# Patient Record
Sex: Female | Born: 1935 | Race: White | Hispanic: No | Marital: Single | Smoking: Current every day smoker
Health system: Southern US, Community
[De-identification: ages and names within clinical notes are randomized; demographics above are authoritative.]

## PROBLEM LIST (undated history)

## (undated) HISTORY — PX: ABDOMINAL HYSTERECTOMY: SHX81

## (undated) HISTORY — PX: APPENDECTOMY: SHX54

## (undated) HISTORY — PX: KNEE SURGERY: SHX244

---

## 2000-12-28 ENCOUNTER — Inpatient Hospital Stay (HOSPITAL_COMMUNITY): Admission: EM | Admit: 2000-12-28 | Discharge: 2000-12-30 | Payer: Self-pay | Admitting: Emergency Medicine

## 2001-02-03 ENCOUNTER — Encounter: Payer: Self-pay | Admitting: Internal Medicine

## 2001-02-03 ENCOUNTER — Encounter: Admission: RE | Admit: 2001-02-03 | Discharge: 2001-02-03 | Payer: Self-pay | Admitting: Internal Medicine

## 2003-09-23 ENCOUNTER — Emergency Department (HOSPITAL_COMMUNITY): Admission: EM | Admit: 2003-09-23 | Discharge: 2003-09-24 | Payer: Self-pay | Admitting: Emergency Medicine

## 2004-10-10 ENCOUNTER — Other Ambulatory Visit: Admission: RE | Admit: 2004-10-10 | Discharge: 2004-10-10 | Payer: Self-pay | Admitting: Obstetrics & Gynecology

## 2005-02-10 ENCOUNTER — Emergency Department (HOSPITAL_COMMUNITY): Admission: EM | Admit: 2005-02-10 | Discharge: 2005-02-10 | Payer: Self-pay | Admitting: Emergency Medicine

## 2006-01-18 ENCOUNTER — Emergency Department (HOSPITAL_COMMUNITY): Admission: EM | Admit: 2006-01-18 | Discharge: 2006-01-18 | Payer: Self-pay | Admitting: Emergency Medicine

## 2006-02-14 ENCOUNTER — Emergency Department (HOSPITAL_COMMUNITY): Admission: EM | Admit: 2006-02-14 | Discharge: 2006-02-14 | Payer: Self-pay | Admitting: Emergency Medicine

## 2006-12-16 ENCOUNTER — Encounter: Admission: RE | Admit: 2006-12-16 | Discharge: 2006-12-16 | Payer: Self-pay | Admitting: Internal Medicine

## 2007-05-21 ENCOUNTER — Encounter: Admission: RE | Admit: 2007-05-21 | Discharge: 2007-05-21 | Payer: Self-pay | Admitting: Orthopedic Surgery

## 2010-03-18 ENCOUNTER — Emergency Department (HOSPITAL_COMMUNITY): Admission: EM | Admit: 2010-03-18 | Discharge: 2010-03-18 | Payer: Self-pay | Admitting: Emergency Medicine

## 2010-10-12 ENCOUNTER — Other Ambulatory Visit: Payer: Self-pay | Admitting: Internal Medicine

## 2010-10-12 DIAGNOSIS — R10A1 Flank pain, right side: Secondary | ICD-10-CM

## 2010-10-12 DIAGNOSIS — R109 Unspecified abdominal pain: Secondary | ICD-10-CM

## 2010-10-15 ENCOUNTER — Ambulatory Visit
Admission: RE | Admit: 2010-10-15 | Discharge: 2010-10-15 | Disposition: A | Discharge status: Home / Self Care | Payer: Medicare Other | Source: Ambulatory Visit | Attending: Internal Medicine | Admitting: Internal Medicine

## 2010-10-15 DIAGNOSIS — R109 Unspecified abdominal pain: Secondary | ICD-10-CM

## 2010-12-14 NOTE — Procedures (Signed)
[facility]  Patient:    Joaquin Courts [facility]                         MRN: [id number] Proc. Date: 12/28/00 Adm. Date:  [id number] Attending:  Pearla Dubonnet CC:         Pearla Dubonnet, M.D.   Procedure Report  PROCEDURE:  Esophagogastroduodenoscopy with biopsy.  INDICATIONS FOR PROCEDURE:  Hematemesis and hematochezia.  DESCRIPTION OF PROCEDURE:  The patient was placed in the left lateral decubitus position and placed on the pulse monitor with continuous low flow oxygen delivered by nasal cannula. She was sedated with 37.5 mcg IV fentanyl and 3 mg IV Versed. The Olympus video endoscope was advanced under direct vision into the oropharynx and esophagus. The esophagus was straight and of normal caliber with the squamocolumnar line at 35 cm sharply outlined by a prominent Schatzkis ring with a 3-4 cm hiatal hernia distal to it. The stomach was entered and there was a moderate amount of blood clots in the stomach. This was lavaged and eventually cleared down to about 2 or 3 mature clots that could not be suctioned away and these appeared to be free floating and not adherent to anything beneath them. The obscured the view of parts of the fundus and I could not rule out a bleeding lesion in that area although no active bleeding was seen with prolonged irrigation and observation. Retroflexed view of the cardia confirmed the hiatal hernia and it was otherwise unremarkable. The body appeared relatively normal. The antrum looked somewhat inflamed with small erosions and one small ulcer with clean base of approximately 4 mm in diameter which appeared to be low risk for causing significant bleeding. The pylorus was slightly deformed and I had difficulty advancing the scope into the duodenum although the pylorus seemed quite patent this was presumably due to distortion of the distal stomach and looping. I eventually could visualize the bulb well and it appeared  somewhat dilated and initially had blood clots in it but these cleared with lavage and no lesions were seen in the duodenal bulb. I could not advance the scope despite fully inserting it deep into the bulb to visualize the post bulbar duodenum. No blood was seen to reflux back into the bulb from the second portion during the period of observation. The scope was withdrawn back into the stomach and a CLOtest obtained. The scope was then withdrawn and the patient returned to the recovery room in stable condition. The patient tolerated the procedure well and there were no immediate complications.  IMPRESSION: 1. Recent active upper GI bleeding presumed from stomach or possibly    post bulbar duodenum. No active bleeding source identified. 2. Small antral ulcer and antral gastritis appearing unlikely to be the    source of significant bleeding. 3. Schatzkis ring. 4. Large hiatal hernia. 5. Inability to visualize the post bulbar duodenum.  PLAN: 1. IV proton pump inhibitor. 2. Withhold nonsteroidal anti-inflammatory drugs which she uses frequently. 3. Await CLOtest. 4. Repeat endoscopy possibly with the pediatric colonoscope if evidence    of recurrent or continued bleeding. DD:  12/28/00 TD:  12/29/00 Job: [id number]

## 2010-12-14 NOTE — H&P (Signed)
[facility]  Patient:    Joaquin Courts [facility]                         MRN: [id number] Adm. Date:  [id number] Attending:  Pearla Dubonnet CC:         Everardo All. Madilyn Fireman, M.D.   History and Physical  DATE OF BIRTH:  1930/08/08  CHIEF COMPLAINT:  GI bleed.  HISTORY OF PRESENT ILLNESS:  Mrs. Arlina Robes is a pleasant 77 year old female with a history of DJD of the hands and knees.  She takes about eight Excedrin a day and has for many years.  She developed nausea and vomiting of bright red blood followed by bright red blood per rectum night before last.  She felt weak over the past couple of days and was having maroon stools today and presented to the [facility] Emergency Room.  She was found to appear pale and have a hemoglobin of 9.1.  Initially, her heart rate was up in the 120s but at rest, was 86.  She developed nausea and vomiting of coffee grounds after admission to her hospital room and she is now going down for upper endoscopy per Dr. Everardo All. Madilyn Fireman.  MEDICATIONS: 1. Excedrin approximately eight daily. 2. Multivitamin one daily. 3. Tagamet HB for the last two days only.  ALLERGIES: 1. ANCEF -- causes anaphylaxis. 2. CODEINE causes some itching. 3. Had severe agitation and confusion to IV PHENERGAN today; this has since    cleared.  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  PAST SURGICAL HISTORY: 1. Bilateral total hip replacements in the 1980s and 1990s. 2. Total abdominal hysterectomy in the 1970s secondary to fibroids. 3. Appendectomy in the 1950s. 4. Lumbar spinal fusion in the 1990s. 5. Spinal diskectomy in the 1980s.  FAMILY HISTORY:  Noncontributory.  SOCIAL HISTORY:  Patient is widowed.  Patient did have to suffer a childs death during her lifetime.  She is a smoker, one pack a day for the past four years, but she does not drink alcohol.  REVIEW OF SYSTEMS:  No  chest pain or shortness of breath.  She is weak.  No headache.  No orthopnea.  No PND.  She did have abdominal pain two days ago but none now.  No dysuria or urinary frequency.  PHYSICAL EXAMINATION:  GENERAL:  Well-developed, pale-appearing female in no acute distress.  VITAL SIGNS:  Blood pressure 100/63, sitting; pulse 86 and regular; respiratory rate 18 and easy; temperature 96.5, axillary.  She is 5-feet 2-inches tall.  Weight 108 pounds.  HEENT:  She wears glasses and she has pale conjunctivae.  Her mucous membranes are moist.  NECK:  Supple without JVD.  CHEST:  Clear to auscultation.  CARDIAC:  Regular rate and rhythm with a 1/6 systolic ejection murmur over the sternal area.  ABDOMEN:  Soft, nondistended.  Bowel sounds are mildly increased.  No masses. No rebound.  Nontender.  EXTREMITIES:  Without clubbing, cyanosis, or edema.  Her extremities I do feel are rather pale.  She is status post bilateral total knee replacements with anterior scars over both knees.  She has extensive osteoarthritic changes in the hands including Heberdens nodes.  Her nail beds are pale.  NEUROLOGIC:  Oriented x 3 but she was agitated and confused temporarily after receiving IV Phenergan today.  SKIN:  Without rashes.  LABORATORY DATA:  WBC 14,100, hemoglobin  9.1, platelet count 277,000.  Pro time 13.7 seconds, PTT 26 seconds.  Sodium 134, potassium 4.5, chloride 105, bicarb 26, BUN 40, creatinine 1.0, blood sugar 146.  LFTs normal.  Urinalysis pending.  ASSESSMENT:  [age]-year-old female with probable upper gastrointestinal bleed secondary to aspirin use.  She likely still has active bleeding and will be endoscoped momentarily by Dr. Dorena Cookey.  PLAN: 1. IV Protonix 40 mg a day. 2. IV normal saline at 75 cc/hr for now. 3. CBC every six hours. 4. Send UA and culture -- she does have a viral leukocytosis but this is    likely secondary to the stress of her GI bleed. 5.  CLOtest with endoscopy. 6. Ultram 50 mg q.6-8h. p.r.n. painful joints. 7. Dr. Madilyn Fireman to endoscope immediately. 8. Consider Carafate therapy as well if clinically indicated. 9. If she needs medicine for nausea and vomiting, we will try Zofran 2 mg    q.3-6h. DD:  12/28/00 TD:  12/29/00 Job: [id number]

## 2010-12-14 NOTE — Discharge Summary (Signed)
[facility]  Patient:    Joaquin Courts [facility]                         MRN: [id number] Adm. Date:  [id number] Disc. Date: [id number] Attending:  Pearla Dubonnet CC:         Everardo All. Madilyn Fireman, M.D.   Discharge Summary  DISCHARGE DIAGNOSES: 1. Gastrointestinal bleed thought to be secondary to upper intestinal source -    likely secondary to daily nonsteroidal anti-inflammatory drug use. 2. Degenerative joint disease primarily of the knees with bilateral knee    replacements.  DISCHARGE MEDICATIONS: 1. Protonix 40 mg orally daily. 2. Tylenol 650 mg orally every 4-6 hours for joint pains p.r.n.  CONSULTATIONS:  John C. Madilyn Fireman, M.D., December 28, 2000, upper endoscopy performed revealing Schatzkis ring and a hiatal hernia as well as blood clots in the gastric fundus.  No active bleeding was noted.  There was an antral gastritis with small nonbleeding ulcer.  The second portion of the duodenum could not be intubated.  A CLOtest was performed, and results are pending.  HOSPITAL COURSE:  Ms. Arlina Robes is a very pleasant 77 year old female with a history of DJD of the hands and knees.  She had been taking 8 Excedrin a day and developed nausea and vomiting of bright red blood followed by bright red blood per rectum two days prior to admission.  She felt weak on the day of admission, December 28, 2000, and had maroon blood from rectum.  She went to the [facility] Emergency Room and was found to have a hemoglobin of 9.1.  She developed nausea and vomiting of coffee grounds after admission.  She was started on intravenous Protonix and actually transfused two units of blood when her hemoglobin dropped further to 6.3.  After transfusion, her hemoglobin rose to 8.2 and then to 9.1 and by discharge, it was 8.2.  Dr. Dorena Cookey was consulted to perform upper endoscopy as above.  By the second day of hospitalization, she had experienced no further bleeding, and vital signs were stable.   No obvious source of bleeding was found, though it looked as though she probably had a bleed from the stomach.  Could have had a Dieulafoy lesion.  She was discharged in stable condition on December 30, 2000, to follow up at [facility].  She was to refrain from NSAID use and was to be on the above medications. DD:  01/18/01 TD:  01/19/01 Job: [id number]

## 2012-05-12 DIAGNOSIS — Z23 Encounter for immunization: Secondary | ICD-10-CM | POA: Diagnosis not present

## 2013-02-04 DIAGNOSIS — H524 Presbyopia: Secondary | ICD-10-CM | POA: Diagnosis not present

## 2013-02-04 DIAGNOSIS — H5200 Hypermetropia, unspecified eye: Secondary | ICD-10-CM | POA: Diagnosis not present

## 2013-02-04 DIAGNOSIS — H35379 Puckering of macula, unspecified eye: Secondary | ICD-10-CM | POA: Diagnosis not present

## 2013-02-04 DIAGNOSIS — H52229 Regular astigmatism, unspecified eye: Secondary | ICD-10-CM | POA: Diagnosis not present

## 2014-04-26 DIAGNOSIS — Z23 Encounter for immunization: Secondary | ICD-10-CM | POA: Diagnosis not present

## 2014-10-28 DIAGNOSIS — R8290 Unspecified abnormal findings in urine: Secondary | ICD-10-CM | POA: Diagnosis not present

## 2014-10-28 DIAGNOSIS — R634 Abnormal weight loss: Secondary | ICD-10-CM | POA: Diagnosis not present

## 2014-10-28 DIAGNOSIS — N399 Disorder of urinary system, unspecified: Secondary | ICD-10-CM | POA: Diagnosis not present

## 2014-11-04 DIAGNOSIS — R634 Abnormal weight loss: Secondary | ICD-10-CM | POA: Diagnosis not present

## 2014-11-04 DIAGNOSIS — N399 Disorder of urinary system, unspecified: Secondary | ICD-10-CM | POA: Diagnosis not present

## 2014-11-04 DIAGNOSIS — N898 Other specified noninflammatory disorders of vagina: Secondary | ICD-10-CM | POA: Diagnosis not present

## 2018-10-27 ENCOUNTER — Emergency Department (HOSPITAL_COMMUNITY): Payer: Medicare HMO

## 2018-10-27 ENCOUNTER — Encounter (HOSPITAL_COMMUNITY): Payer: Self-pay | Admitting: *Deleted

## 2018-10-27 ENCOUNTER — Other Ambulatory Visit: Payer: Self-pay

## 2018-10-27 ENCOUNTER — Emergency Department (HOSPITAL_COMMUNITY)
Admission: EM | Admit: 2018-10-27 | Discharge: 2018-10-28 | Disposition: A | Discharge status: Home / Self Care | Payer: Medicare HMO | Attending: Emergency Medicine | Admitting: Emergency Medicine

## 2018-10-27 DIAGNOSIS — S40011A Contusion of right shoulder, initial encounter: Secondary | ICD-10-CM | POA: Diagnosis not present

## 2018-10-27 DIAGNOSIS — Y929 Unspecified place or not applicable: Secondary | ICD-10-CM | POA: Insufficient documentation

## 2018-10-27 DIAGNOSIS — W01198A Fall on same level from slipping, tripping and stumbling with subsequent striking against other object, initial encounter: Secondary | ICD-10-CM | POA: Insufficient documentation

## 2018-10-27 DIAGNOSIS — Y999 Unspecified external cause status: Secondary | ICD-10-CM | POA: Diagnosis not present

## 2018-10-27 DIAGNOSIS — S40911A Unspecified superficial injury of right shoulder, initial encounter: Secondary | ICD-10-CM | POA: Diagnosis present

## 2018-10-27 DIAGNOSIS — Y939 Activity, unspecified: Secondary | ICD-10-CM | POA: Insufficient documentation

## 2018-10-27 MED ORDER — METHYLPREDNISOLONE ACETATE 40 MG/ML IJ SUSP
40.0000 mg | Freq: Once | INTRAMUSCULAR | Status: AC
  Administered 2018-10-28: 40 mg via INTRA_ARTICULAR
  Filled 2018-10-27: qty 1

## 2018-10-27 MED ORDER — FENTANYL CITRATE (PF) 100 MCG/2ML IJ SOLN
40.0000 ug | Freq: Once | INTRAMUSCULAR | Status: AC
  Administered 2018-10-27: 40 ug via INTRAVENOUS
  Filled 2018-10-27: qty 2

## 2018-10-27 MED ORDER — KETOROLAC TROMETHAMINE 15 MG/ML IJ SOLN
15.0000 mg | Freq: Once | INTRAMUSCULAR | Status: AC
  Administered 2018-10-27: 15 mg via INTRAVENOUS
  Filled 2018-10-27: qty 1

## 2018-10-27 MED ORDER — BUPIVACAINE HCL (PF) 0.5 % IJ SOLN
10.0000 mL | Freq: Once | INTRAMUSCULAR | Status: AC
  Administered 2018-10-28: 01:00:00 10 mL
  Filled 2018-10-27: qty 30

## 2018-10-27 MED ORDER — TRAMADOL HCL 50 MG PO TABS
50.0000 mg | ORAL_TABLET | Freq: Once | ORAL | Status: AC
  Administered 2018-10-27: 50 mg via ORAL
  Filled 2018-10-27: qty 1

## 2018-10-27 MED ORDER — ACETAMINOPHEN 500 MG PO TABS
1000.0000 mg | ORAL_TABLET | Freq: Once | ORAL | Status: AC
  Administered 2018-10-27: 1000 mg via ORAL
  Filled 2018-10-27: qty 2

## 2018-10-27 MED ORDER — LIDOCAINE 5 % EX PTCH
1.0000 | MEDICATED_PATCH | CUTANEOUS | Status: DC
  Administered 2018-10-27: 1 via TRANSDERMAL
  Filled 2018-10-27 (×2): qty 1

## 2018-10-27 MED ORDER — KETAMINE HCL 50 MG/5ML IJ SOSY: 0.3000 mg/kg | PREFILLED_SYRINGE | Freq: Once | INTRAMUSCULAR | Status: DC

## 2018-10-27 MED ORDER — MORPHINE SULFATE (PF) 4 MG/ML IV SOLN
4.0000 mg | Freq: Once | INTRAVENOUS | Status: AC
  Administered 2018-10-27: 4 mg via INTRAVENOUS
  Filled 2018-10-27: qty 1

## 2018-10-27 NOTE — ED Provider Notes (Addendum)
[facility]-EMERGENCY DEPT Provider Note   CSN: [id number] Arrival date & time: 10/27/18  1939    History   Chief Complaint Chief Complaint  Patient presents with  . Shoulder Injury    HPI Joanne Burke is a 85 y.o. female.     HPI  85 year old female comes in a chief complaint of shoulder injury. Patient states that she had a fall of week ago.  She fell onto her right shoulder and since then has been having severe right shoulder pain.  Patient is left-handed, and has been using the right hand bearing knee.  Last night however she started having severe pain that is only gotten worse throughout the day.  Patient has been taking BC powders without relief.  She does not want to take anything with codeine because it makes her sick. She denies any numbness, tingling in her upper extremity.  She also denies any fevers, chills. She does not have any history of diabetes or immunosuppression.  History reviewed. No pertinent past medical history.  There are no active problems to display for this patient.   Past Surgical History:  Procedure Laterality Date  . ABDOMINAL HYSTERECTOMY    . APPENDECTOMY    . KNEE SURGERY       OB History   No obstetric history on file.      Home Medications    Prior to Admission medications   Medication Sig Start Date End Date Taking? Authorizing Provider  acetaminophen (TYLENOL) 500 MG tablet Take 1,000 mg by mouth every 6 (six) hours as needed for mild pain.   Yes [provider]    Family History No family history on file.  Social History Social History   Tobacco Use  . Smoking status: Never Smoker  . Smokeless tobacco: Never Used  Substance Use Topics  . Alcohol use: Never    Frequency: Never  . Drug use: Never     Allergies   Ancef [cefazolin]   Review of Systems Review of Systems  Constitutional: Positive for activity change.  Musculoskeletal: Positive for arthralgias.  Skin: Positive  for wound. Negative for rash.  Allergic/Immunologic: Negative for immunocompromised state.  Hematological: Does not bruise/bleed easily.     Physical Exam Updated Vital Signs BP (!) 178/136 (BP Location: Left Arm)   Pulse 80   Temp 99 F (37.2 C) (Oral)   Resp 20   Ht 5' (1.524 m)   Wt 45.4 kg   SpO2 99%   BMI 19.53 kg/m   Physical Exam Vitals signs and nursing note reviewed.  Constitutional:      Appearance: She is well-developed.  HENT:     Head: Normocephalic and atraumatic.  Neck:     Musculoskeletal: Normal range of motion and neck supple.  Cardiovascular:     Rate and Rhythm: Normal rate.     Pulses: Normal pulses.  Pulmonary:     Effort: Pulmonary effort is normal.  Abdominal:     General: Bowel sounds are normal.  Musculoskeletal:        General: Swelling and tenderness present. No deformity.     Comments: Right shoulder has some ecchymosis by the [facility] joint.  Otherwise there is no overlying cellulitis, calor.    Her range of motion is indeed limited because of her shoulder pain, however, patient is able to move her shoulder gingerly without worsening of the pain. She has edema by the shoulder  Skin:    General: Skin is warm and dry.  Capillary Refill: Capillary refill takes less than 2 seconds.  Neurological:     Mental Status: She is alert and oriented to person, place, and time.      ED Treatments / Results  Labs (all labs ordered are listed, but only abnormal results are displayed) Labs Reviewed  CBC WITH DIFFERENTIAL/PLATELET - Abnormal; Notable for the following components:      Result Value   WBC 16.4 (*)    RDW 17.4 (*)    Neutro Abs 14.2 (*)    Monocytes Absolute 1.2 (*)    All other components within normal limits  BASIC METABOLIC PANEL - Abnormal; Notable for the following components:   Sodium 122 (*)    Chloride 89 (*)    Glucose, Bld 110 (*)    Calcium 8.6 (*)    All other components within normal limits    EKG None  Radiology  Dg Shoulder Right  Result Date: 10/27/2018 CLINICAL DATA:  85 year old female with fall and right shoulder pain. EXAM: RIGHT SHOULDER - 2+ VIEW COMPARISON:  None. FINDINGS: There is focal area of defect and fragmentation in the superior aspect of glenoid which is most likely chronic although acute fracture is not entirely excluded. No other acute fracture identified. The bones are osteopenic. No dislocation. There is degenerative changes of the shoulder. The soft tissues are grossly unremarkable. IMPRESSION: Chronic changes versus less likely acute fracture of the superior bony glenoid. Electronically Signed   By: Elgie Collard M.D.   On: 10/27/2018 20:28    Procedures Procedures (including critical care time)  Medications Ordered in ED Medications  lidocaine (LIDODERM) 5 % 1 patch (1 patch Transdermal Patch Applied 10/27/18 2249)  bupivacaine (MARCAINE) 0.5 % injection 10 mL (has no administration in time range)  methylPREDNISolone acetate (DEPO-MEDROL) injection 40 mg (has no administration in time range)  traMADol (ULTRAM) tablet 50 mg (50 mg Oral Given 10/27/18 2058)  acetaminophen (TYLENOL) tablet 1,000 mg (1,000 mg Oral Given 10/27/18 2058)  fentaNYL (SUBLIMAZE) injection 40 mcg (40 mcg Intravenous Given 10/27/18 2211)  ketorolac (TORADOL) 15 MG/ML injection 15 mg (15 mg Intravenous Given 10/27/18 2250)  morphine 4 MG/ML injection 4 mg (4 mg Intravenous Given 10/27/18 2250)     Initial Impression / Assessment and Plan / ED Course  I have reviewed the triage vital signs and the nursing notes.  Pertinent labs & imaging results that were available during my care of the patient were reviewed by me and considered in my medical decision making (see chart for details).        85 year old female has reproducible pain over the right shoulder. Clinically this does not appear to be a septic joint.  X-ray ordered and there is possibility of subacute fracture, therefore it is possible that she  might have a fracture.  However it does not explain why the pain got worse tonight.   Patient is immunocompetent, no signs of infection - no callor /erythema over the overlying skin  Other possibility includes torn rotator cuff, tendinitis.  Patient was given oral pain meds followed by IV fentanyl and then IV morphine.  She continued to have severe pain therefore I spoke with Dr. Dion Saucier, orthopedics.  He recommends that we get a CT scan of the shoulder to make sure there is no fracture.  He suspects that the pain is likely because of soft tissue injury or arthritis.  Dr. Oletta Cohn to follow-up on the CT scan.  We have decided to give her intra articular meds  for better pain control.  Final Clinical Impressions(s) / ED Diagnoses   Final diagnoses:  Contusion of right shoulder, initial encounter    ED Discharge Orders    None       Derwood Kaplan, MD 10/28/18 Vent[place] Bruns    Derwood Kaplan, MD 10/28/18 [id number]

## 2018-10-27 NOTE — ED Triage Notes (Signed)
Pt states she fell on concrete a week ago and has had rt shoulder pain since, pain it worse starting last night.

## 2018-10-28 LAB — CBC WITH DIFFERENTIAL/PLATELET
Abs Immature Granulocytes: 0.07 10*3/uL (ref 0.00–0.07)
Basophils Absolute: 0.1 10*3/uL (ref 0.0–0.1)
Basophils Relative: 1 %
EOS ABS: 0 10*3/uL (ref 0.0–0.5)
Eosinophils Relative: 0 %
HCT: 37.2 % (ref 36.0–46.0)
Hemoglobin: 12.5 g/dL (ref 12.0–15.0)
Immature Granulocytes: 0 %
Lymphocytes Relative: 5 %
Lymphs Abs: 0.9 10*3/uL (ref 0.7–4.0)
MCH: 29.3 pg (ref 26.0–34.0)
MCHC: 33.6 g/dL (ref 30.0–36.0)
MCV: 87.3 fL (ref 80.0–100.0)
Monocytes Absolute: 1.2 10*3/uL — ABNORMAL HIGH (ref 0.1–1.0)
Monocytes Relative: 7 %
Neutro Abs: 14.2 10*3/uL — ABNORMAL HIGH (ref 1.7–7.7)
Neutrophils Relative %: 87 %
Platelets: 160 10*3/uL (ref 150–400)
RBC: 4.26 MIL/uL (ref 3.87–5.11)
RDW: 17.4 % — ABNORMAL HIGH (ref 11.5–15.5)
WBC: 16.4 10*3/uL — AB (ref 4.0–10.5)
nRBC: 0 % (ref 0.0–0.2)

## 2018-10-28 LAB — BASIC METABOLIC PANEL
Anion gap: 11 (ref 5–15)
BUN: 13 mg/dL (ref 8–23)
CO2: 22 mmol/L (ref 22–32)
CREATININE: 0.52 mg/dL (ref 0.44–1.00)
Calcium: 8.6 mg/dL — ABNORMAL LOW (ref 8.9–10.3)
Chloride: 89 mmol/L — ABNORMAL LOW (ref 98–111)
GFR calc Af Amer: >60 mL/min (ref >60)
GFR calc non Af Amer: >60 mL/min (ref >60)
Glucose, Bld: 110 mg/dL — ABNORMAL HIGH (ref 70–99)
Potassium: 3.8 mmol/L (ref 3.5–5.1)
Sodium: 122 mmol/L — ABNORMAL LOW (ref 135–145)

## 2018-10-28 MED ORDER — OXYCODONE-ACETAMINOPHEN 5-325 MG PO TABS: 0.5000 | ORAL_TABLET | Freq: Four times a day (QID) | ORAL | 0 refills | Status: DC | PRN

## 2018-10-28 MED ORDER — ONDANSETRON HCL 4 MG PO TABS: 4.0000 mg | ORAL_TABLET | Freq: Four times a day (QID) | ORAL | 0 refills | Status: DC

## 2018-10-28 NOTE — Discharge Instructions (Addendum)
You need to follow-up with your orthopedic surgeon.  Call in the morning.  If you cannot figure out who performed the surgery on your left shoulder, please call Dr. Dion Saucier for follow-up.

## 2018-10-28 NOTE — ED Provider Notes (Signed)
Asked to perform shoulder injection by Dr. Rhunette Croft. Dr. Rhunette Croft spoke with Dr Dion Saucier about patient who has severe shoulder pain after a fall. Dr. Dion Saucier recommended depo-medrol/bupivicaine injection.  Physical Exam  BP (!) 178/136 (BP Location: Left Arm)   Pulse 80   Temp 99 F (37.2 C) (Oral)   Resp 20   Ht 5' (1.524 m)   Wt 45.4 kg   SpO2 99%   BMI 19.53 kg/m   Physical Exam Musculoskeletal:     Right shoulder: She exhibits decreased range of motion and tenderness.     Comments: No erythema, no warmth or signs of infection.  Abrasion lateral shoulder area, no sign infection     ED Course/Procedures     Injection of joint Date/Time: 10/28/2018 12:41 AM Performed by: Gilda Crease, MD Authorized by: Gilda Crease, MD  Consent: Verbal consent obtained. Risks and benefits: risks, benefits and alternatives were discussed Consent given by: patient Patient understanding: patient states understanding of the procedure being performed Patient consent: the patient's understanding of the procedure matches consent given Procedure consent: procedure consent matches procedure scheduled Site marked: the operative site was marked Imaging studies: imaging studies available Required items: required blood products, implants, devices, and special equipment available Patient identity confirmed: verbally with patient and hospital-assigned identification number Time out: Immediately prior to procedure a "time out" was called to verify the correct patient, procedure, equipment, support staff and site/side marked as required. Preparation: Patient was prepped and draped in the usual sterile fashion.  Sedation: Patient sedated: no  Patient tolerance: Patient tolerated the procedure well with no immediate complications Comments: Injected 40mg  Depo-medrol and 42mL of 0.5% bupivicaine using standard anterior approach to glenohumeral joint. Aspirated before injection, no  hemarthrosis or joint effusion noted.     MDM  Recheck after injection reveals that the patient has minimal pain, excellent range of motion.  Will remain in sling for comfort, continue lidocaine patch and will prescribe 0.5 Percocet every 6-8 hours as needed for pain.       Gilda Crease, MD 10/28/18 [phone]

## 2018-10-28 NOTE — ED Notes (Signed)
meds pulled given to provider

## 2019-04-25 ENCOUNTER — Inpatient Hospital Stay (HOSPITAL_COMMUNITY)
Admission: EM | Admit: 2019-04-25 | Discharge: 2019-05-16 | DRG: 853 | Disposition: A | Discharge status: Home Health | Payer: Medicare HMO | Attending: Family Medicine | Admitting: Family Medicine

## 2019-04-25 ENCOUNTER — Emergency Department (HOSPITAL_COMMUNITY): Payer: Medicare HMO

## 2019-04-25 ENCOUNTER — Other Ambulatory Visit: Payer: Self-pay

## 2019-04-25 ENCOUNTER — Encounter (HOSPITAL_COMMUNITY): Payer: Self-pay | Admitting: Internal Medicine

## 2019-04-25 DIAGNOSIS — D72829 Elevated white blood cell count, unspecified: Secondary | ICD-10-CM | POA: Diagnosis not present

## 2019-04-25 DIAGNOSIS — K5900 Constipation, unspecified: Secondary | ICD-10-CM | POA: Diagnosis present

## 2019-04-25 DIAGNOSIS — F17210 Nicotine dependence, cigarettes, uncomplicated: Secondary | ICD-10-CM | POA: Diagnosis present

## 2019-04-25 DIAGNOSIS — E876 Hypokalemia: Secondary | ICD-10-CM | POA: Diagnosis not present

## 2019-04-25 DIAGNOSIS — R188 Other ascites: Secondary | ICD-10-CM | POA: Diagnosis present

## 2019-04-25 DIAGNOSIS — E872 Acidosis: Secondary | ICD-10-CM | POA: Diagnosis present

## 2019-04-25 DIAGNOSIS — J9601 Acute respiratory failure with hypoxia: Secondary | ICD-10-CM | POA: Diagnosis present

## 2019-04-25 DIAGNOSIS — E871 Hypo-osmolality and hyponatremia: Secondary | ICD-10-CM | POA: Diagnosis present

## 2019-04-25 DIAGNOSIS — D638 Anemia in other chronic diseases classified elsewhere: Secondary | ICD-10-CM | POA: Diagnosis present

## 2019-04-25 DIAGNOSIS — R34 Anuria and oliguria: Secondary | ICD-10-CM | POA: Diagnosis present

## 2019-04-25 DIAGNOSIS — N170 Acute kidney failure with tubular necrosis: Secondary | ICD-10-CM | POA: Diagnosis present

## 2019-04-25 DIAGNOSIS — K5720 Diverticulitis of large intestine with perforation and abscess without bleeding: Secondary | ICD-10-CM | POA: Diagnosis present

## 2019-04-25 DIAGNOSIS — A419 Sepsis, unspecified organism: Principal | ICD-10-CM | POA: Diagnosis present

## 2019-04-25 DIAGNOSIS — N739 Female pelvic inflammatory disease, unspecified: Secondary | ICD-10-CM | POA: Diagnosis present

## 2019-04-25 DIAGNOSIS — G9341 Metabolic encephalopathy: Secondary | ICD-10-CM | POA: Diagnosis present

## 2019-04-25 DIAGNOSIS — R6520 Severe sepsis without septic shock: Secondary | ICD-10-CM | POA: Diagnosis present

## 2019-04-25 DIAGNOSIS — Z79899 Other long term (current) drug therapy: Secondary | ICD-10-CM

## 2019-04-25 DIAGNOSIS — R5381 Other malaise: Secondary | ICD-10-CM | POA: Diagnosis present

## 2019-04-25 DIAGNOSIS — T40605A Adverse effect of unspecified narcotics, initial encounter: Secondary | ICD-10-CM | POA: Diagnosis present

## 2019-04-25 DIAGNOSIS — I959 Hypotension, unspecified: Secondary | ICD-10-CM | POA: Diagnosis present

## 2019-04-25 DIAGNOSIS — D62 Acute posthemorrhagic anemia: Secondary | ICD-10-CM | POA: Diagnosis not present

## 2019-04-25 DIAGNOSIS — T508X5A Adverse effect of diagnostic agents, initial encounter: Secondary | ICD-10-CM | POA: Diagnosis not present

## 2019-04-25 DIAGNOSIS — Z888 Allergy status to other drugs, medicaments and biological substances status: Secondary | ICD-10-CM

## 2019-04-25 DIAGNOSIS — Z7189 Other specified counseling: Secondary | ICD-10-CM

## 2019-04-25 DIAGNOSIS — Z515 Encounter for palliative care: Secondary | ICD-10-CM | POA: Diagnosis present

## 2019-04-25 DIAGNOSIS — Z885 Allergy status to narcotic agent status: Secondary | ICD-10-CM

## 2019-04-25 DIAGNOSIS — E669 Obesity, unspecified: Secondary | ICD-10-CM | POA: Diagnosis present

## 2019-04-25 DIAGNOSIS — F05 Delirium due to known physiological condition: Secondary | ICD-10-CM | POA: Diagnosis not present

## 2019-04-25 DIAGNOSIS — M8448XA Pathological fracture, other site, initial encounter for fracture: Secondary | ICD-10-CM | POA: Diagnosis present

## 2019-04-25 DIAGNOSIS — N179 Acute kidney failure, unspecified: Secondary | ICD-10-CM | POA: Diagnosis not present

## 2019-04-25 DIAGNOSIS — Z20828 Contact with and (suspected) exposure to other viral communicable diseases: Secondary | ICD-10-CM | POA: Diagnosis present

## 2019-04-25 DIAGNOSIS — Z781 Physical restraint status: Secondary | ICD-10-CM

## 2019-04-25 DIAGNOSIS — E870 Hyperosmolality and hypernatremia: Secondary | ICD-10-CM | POA: Diagnosis not present

## 2019-04-25 DIAGNOSIS — J189 Pneumonia, unspecified organism: Secondary | ICD-10-CM | POA: Diagnosis present

## 2019-04-25 DIAGNOSIS — Z79891 Long term (current) use of opiate analgesic: Secondary | ICD-10-CM

## 2019-04-25 DIAGNOSIS — Z6835 Body mass index (BMI) 35.0-35.9, adult: Secondary | ICD-10-CM

## 2019-04-25 DIAGNOSIS — Z87410 Personal history of cervical dysplasia: Secondary | ICD-10-CM

## 2019-04-25 DIAGNOSIS — N141 Nephropathy induced by other drugs, medicaments and biological substances: Secondary | ICD-10-CM | POA: Diagnosis not present

## 2019-04-25 DIAGNOSIS — T424X5A Adverse effect of benzodiazepines, initial encounter: Secondary | ICD-10-CM | POA: Diagnosis present

## 2019-04-25 DIAGNOSIS — R0602 Shortness of breath: Secondary | ICD-10-CM

## 2019-04-25 LAB — URINALYSIS, ROUTINE W REFLEX MICROSCOPIC
Bacteria, UA: NONE SEEN
Bilirubin Urine: NEGATIVE
Glucose, UA: NEGATIVE mg/dL
Hgb urine dipstick: NEGATIVE
Ketones, ur: 5 mg/dL — AB
Leukocytes,Ua: NEGATIVE
Nitrite: NEGATIVE
Protein, ur: 30 mg/dL — AB
Specific Gravity, Urine: 1.016 (ref 1.005–1.030)
pH: 5 (ref 5.0–8.0)

## 2019-04-25 LAB — COMPREHENSIVE METABOLIC PANEL
ALT: 33 U/L (ref 0–44)
AST: 46 U/L — ABNORMAL HIGH (ref 15–41)
Albumin: 2.4 g/dL — ABNORMAL LOW (ref 3.5–5.0)
Alkaline Phosphatase: 237 U/L — ABNORMAL HIGH (ref 38–126)
Anion gap: 16 — ABNORMAL HIGH (ref 5–15)
BUN: 21 mg/dL (ref 8–23)
CO2: 17 mmol/L — ABNORMAL LOW (ref 22–32)
Calcium: 8.9 mg/dL (ref 8.9–10.3)
Chloride: 98 mmol/L (ref 98–111)
Creatinine, Ser: 1.02 mg/dL — ABNORMAL HIGH (ref 0.44–1.00)
GFR calc Af Amer: 58 mL/min — ABNORMAL LOW (ref >60)
GFR calc non Af Amer: 50 mL/min — ABNORMAL LOW (ref >60)
Glucose, Bld: 99 mg/dL (ref 70–99)
Potassium: 3.6 mmol/L (ref 3.5–5.1)
Sodium: 131 mmol/L — ABNORMAL LOW (ref 135–145)
Total Bilirubin: 1.7 mg/dL — ABNORMAL HIGH (ref 0.3–1.2)
Total Protein: 6.1 g/dL — ABNORMAL LOW (ref 6.5–8.1)

## 2019-04-25 LAB — CBC
HCT: 40 % (ref 36.0–46.0)
Hemoglobin: 13.1 g/dL (ref 12.0–15.0)
MCH: 27.9 pg (ref 26.0–34.0)
MCHC: 32.8 g/dL (ref 30.0–36.0)
MCV: 85.3 fL (ref 80.0–100.0)
Platelets: 166 10*3/uL (ref 150–400)
RBC: 4.69 MIL/uL (ref 3.87–5.11)
RDW: 18.3 % — ABNORMAL HIGH (ref 11.5–15.5)
WBC: 7.8 10*3/uL (ref 4.0–10.5)
nRBC: 0.3 % — ABNORMAL HIGH (ref 0.0–0.2)

## 2019-04-25 LAB — PROCALCITONIN: Procalcitonin: 147.34 ng/mL

## 2019-04-25 LAB — LIPASE, BLOOD: Lipase: 15 U/L (ref 11–51)

## 2019-04-25 LAB — LACTIC ACID, PLASMA
Lactic Acid, Venous: 3.1 mmol/L (ref 0.5–1.9)
Lactic Acid, Venous: 3.2 mmol/L (ref 0.5–1.9)
Lactic Acid, Venous: 2.9 mmol/L (ref 0.5–1.9)
Lactic Acid, Venous: 1.6 mmol/L (ref 0.5–1.9)

## 2019-04-25 LAB — MRSA PCR SCREENING: MRSA by PCR: NEGATIVE

## 2019-04-25 LAB — SARS CORONAVIRUS 2 (TAT 6-24 HRS): SARS Coronavirus 2: NEGATIVE

## 2019-04-25 MED ORDER — CIPROFLOXACIN IN D5W 400 MG/200ML IV SOLN
400.0000 mg | Freq: Once | INTRAVENOUS | Status: AC
  Administered 2019-04-25: 400 mg via INTRAVENOUS
  Filled 2019-04-25: qty 200

## 2019-04-25 MED ORDER — ENOXAPARIN SODIUM 30 MG/0.3ML ~~LOC~~ SOLN
30.0000 mg | SUBCUTANEOUS | Status: DC
  Administered 2019-04-25: 17:00:00 30 mg via SUBCUTANEOUS
  Filled 2019-04-25: qty 0.3

## 2019-04-25 MED ORDER — SODIUM CHLORIDE 0.9% FLUSH: 3.0000 mL | Freq: Two times a day (BID) | INTRAVENOUS | Status: DC

## 2019-04-25 MED ORDER — MORPHINE SULFATE 2 MG/ML IV SOLN
INTRAVENOUS | Status: DC
  Administered 2019-04-25 – 2019-04-26 (×2): 2 mg via INTRAVENOUS
  Administered 2019-04-27: 05:00:00 via INTRAVENOUS
  Filled 2019-04-25 (×2): qty 30

## 2019-04-25 MED ORDER — CIPROFLOXACIN IN D5W 200 MG/100ML IV SOLN
200.0000 mg | Freq: Two times a day (BID) | INTRAVENOUS | Status: DC
  Filled 2019-04-25: qty 100

## 2019-04-25 MED ORDER — SODIUM CHLORIDE 0.9 % IV BOLUS
1000.0000 mL | Freq: Once | INTRAVENOUS | Status: AC
  Administered 2019-04-25: 1000 mL via INTRAVENOUS

## 2019-04-25 MED ORDER — SODIUM CHLORIDE 0.9 % IV SOLN
INTRAVENOUS | Status: DC
  Administered 2019-04-25 – 2019-04-26 (×2): via INTRAVENOUS

## 2019-04-25 MED ORDER — METRONIDAZOLE IN NACL 5-0.79 MG/ML-% IV SOLN
500.0000 mg | Freq: Three times a day (TID) | INTRAVENOUS | Status: DC
  Administered 2019-04-25 – 2019-04-26 (×3): 500 mg via INTRAVENOUS
  Filled 2019-04-25 (×3): qty 100

## 2019-04-25 MED ORDER — HYDROMORPHONE HCL 1 MG/ML IJ SOLN
0.5000 mg | Freq: Once | INTRAMUSCULAR | Status: AC
  Administered 2019-04-25: 0.5 mg via INTRAVENOUS
  Filled 2019-04-25: qty 1

## 2019-04-25 MED ORDER — ONDANSETRON HCL 4 MG/2ML IJ SOLN: 4.0000 mg | Freq: Four times a day (QID) | INTRAMUSCULAR | Status: DC | PRN

## 2019-04-25 MED ORDER — SODIUM CHLORIDE 0.9% FLUSH: 9.0000 mL | INTRAVENOUS | Status: DC | PRN

## 2019-04-25 MED ORDER — DIPHENHYDRAMINE HCL 50 MG/ML IJ SOLN: 12.5000 mg | Freq: Four times a day (QID) | INTRAMUSCULAR | Status: DC | PRN

## 2019-04-25 MED ORDER — ONDANSETRON HCL 4 MG/2ML IJ SOLN
4.0000 mg | Freq: Once | INTRAMUSCULAR | Status: AC
  Administered 2019-04-25: 4 mg via INTRAVENOUS
  Filled 2019-04-25: qty 2

## 2019-04-25 MED ORDER — POTASSIUM CHLORIDE 2 MEQ/ML IV SOLN: INTRAVENOUS | Status: DC

## 2019-04-25 MED ORDER — SODIUM CHLORIDE 0.9 % IV BOLUS
500.0000 mL | Freq: Once | INTRAVENOUS | Status: AC
  Administered 2019-04-25: 500 mL via INTRAVENOUS

## 2019-04-25 MED ORDER — SODIUM CHLORIDE 0.9 % IV SOLN
2.0000 g | Freq: Once | INTRAVENOUS | Status: DC
  Filled 2019-04-25: qty 2

## 2019-04-25 MED ORDER — DIPHENHYDRAMINE HCL 12.5 MG/5ML PO ELIX: 12.5000 mg | ORAL_SOLUTION | Freq: Four times a day (QID) | ORAL | Status: DC | PRN

## 2019-04-25 MED ORDER — ACETAMINOPHEN 325 MG PO TABS
650.0000 mg | ORAL_TABLET | Freq: Four times a day (QID) | ORAL | Status: DC | PRN
  Filled 2019-04-25: qty 2

## 2019-04-25 MED ORDER — FENTANYL CITRATE (PF) 100 MCG/2ML IJ SOLN
50.0000 ug | Freq: Once | INTRAMUSCULAR | Status: AC
  Administered 2019-04-25: 50 ug via INTRAVENOUS
  Filled 2019-04-25: qty 2

## 2019-04-25 MED ORDER — FENTANYL CITRATE (PF) 100 MCG/2ML IJ SOLN
25.0000 ug | INTRAMUSCULAR | Status: DC | PRN
  Filled 2019-04-25 (×2): qty 2

## 2019-04-25 MED ORDER — NALOXONE HCL 0.4 MG/ML IJ SOLN: 0.4000 mg | INTRAMUSCULAR | Status: DC | PRN

## 2019-04-25 MED ORDER — ACETAMINOPHEN 650 MG RE SUPP
650.0000 mg | Freq: Four times a day (QID) | RECTAL | Status: DC | PRN
  Administered 2019-04-28: 650 mg via RECTAL
  Filled 2019-04-25: qty 1

## 2019-04-25 MED ORDER — MORPHINE SULFATE (PF) 4 MG/ML IV SOLN
4.0000 mg | Freq: Once | INTRAVENOUS | Status: AC
  Administered 2019-04-25: 4 mg via INTRAVENOUS
  Filled 2019-04-25: qty 1

## 2019-04-25 MED ORDER — IOHEXOL 350 MG/ML SOLN
80.0000 mL | Freq: Once | INTRAVENOUS | Status: AC | PRN
  Administered 2019-04-25: 80 mL via INTRAVENOUS

## 2019-04-25 MED ORDER — METRONIDAZOLE IN NACL 5-0.79 MG/ML-% IV SOLN
500.0000 mg | Freq: Once | INTRAVENOUS | Status: AC
  Administered 2019-04-25: 500 mg via INTRAVENOUS
  Filled 2019-04-25: qty 100

## 2019-04-25 MED ORDER — SODIUM CHLORIDE 0.9 % IV SOLN
1.0000 g | Freq: Three times a day (TID) | INTRAVENOUS | Status: DC
  Administered 2019-04-25 – 2019-04-26 (×2): 1 g via INTRAVENOUS
  Filled 2019-04-25 (×4): qty 1

## 2019-04-25 MED ORDER — KCL IN DEXTROSE-NACL 20-5-0.9 MEQ/L-%-% IV SOLN
INTRAVENOUS | Status: DC
  Administered 2019-04-25: 10:00:00 via INTRAVENOUS
  Filled 2019-04-25: qty 1000

## 2019-04-25 NOTE — ED Notes (Signed)
PCA pump requested

## 2019-04-25 NOTE — ED Notes (Signed)
Attempted to call report x 1  

## 2019-04-25 NOTE — ED Notes (Signed)
Portable called for a PCA pump

## 2019-04-25 NOTE — ED Provider Notes (Addendum)
[facility] EMERGENCY DEPARTMENT Provider Note  CSN: [id number] Arrival date & time: 04/25/19 0321  Chief Complaint(s) Abdominal Pain  HPI Joanne Burke is a 85 y.o. female with a past medical history of reported constipation who presents to the emergency department with 3 weeks of intermittent abdominal discomfort, gradually worsening over the last four days and became severe tonight.  Pain is in the lower abdomen and is described as a stabbing/cramping pain.  Worse with movement and palpation.  No alleviating factors.  Patient reports having a bowel movement earlier today but reports she only has a small amount of stool.  She is endorsing some nausea without emesis today but has had some episodes of emesis in the past several days.  She also endorses discomfort with urination.  No reported fevers or chills.  No chest pain or shortness of breath.  HPI  Past Medical History No past medical history on file. There are no active problems to display for this patient.  Home Medication(s) Prior to Admission medications   Medication Sig Start Date End Date Taking? Authorizing Provider  acetaminophen (TYLENOL) 500 MG tablet Take 1,000 mg by mouth every 6 (six) hours as needed for mild pain.    [provider]  ondansetron (ZOFRAN) 4 MG tablet Take 1 tablet (4 mg total) by mouth every 6 (six) hours. 10/28/18   Orpah Greek, MD  oxyCODONE-acetaminophen (PERCOCET) 5-325 MG tablet Take 0.5 tablets by mouth every 6 (six) hours as needed. 10/28/18   Orpah Greek, MD                                                                                                                                    Past Surgical History Past Surgical History:  Procedure Laterality Date  . ABDOMINAL HYSTERECTOMY    . APPENDECTOMY    . KNEE SURGERY     Family History No family history on file.  Social History Social History   Tobacco Use  . Smoking status: Never Smoker   . Smokeless tobacco: Never Used  Substance Use Topics  . Alcohol use: Never    Frequency: Never  . Drug use: Never   Allergies Ancef [cefazolin] and Codeine  Review of Systems Review of Systems All other systems are reviewed and are negative for acute change except as noted in the HPI  Physical Exam Vital Signs  I have reviewed the triage vital signs BP 135/76   Pulse (!) 118   Temp 99.3 F (37.4 C) (Oral)   Resp (!) 24   Ht _0  (1.575 m)   Wt 40.8 kg   SpO2 95%   BMI 16.46 kg/m   Physical Exam Vitals signs reviewed.  Constitutional:      General: She is not in acute distress.    Appearance: She is well-developed. She is not diaphoretic.     Comments: In obvious discomfort  HENT:  Head: Normocephalic and atraumatic.     Right Ear: External ear normal.     Left Ear: External ear normal.     Nose: Nose normal.  Eyes:     General: No scleral icterus.    Conjunctiva/sclera: Conjunctivae normal.  Neck:     Musculoskeletal: Normal range of motion.     Trachea: Phonation normal.  Cardiovascular:     Rate and Rhythm: Regular rhythm. Tachycardia present.  Pulmonary:     Effort: Pulmonary effort is normal. No respiratory distress.     Breath sounds: No stridor.  Abdominal:     General: There is no distension.     Tenderness: There is abdominal tenderness in the right lower quadrant, suprapubic area and left lower quadrant. There is guarding. There is no rebound. Negative signs include Murphy's sign and McBurney's sign.     Hernia: No hernia is present.  Musculoskeletal: Normal range of motion.  Neurological:     Mental Status: She is alert and oriented to person, place, and time.  Psychiatric:        Behavior: Behavior normal.     ED Results and Treatments Labs (all labs ordered are listed, but only abnormal results are displayed) Labs Reviewed  COMPREHENSIVE METABOLIC PANEL - Abnormal; Notable for the following components:      Result Value   Sodium 131  (*)    CO2 17 (*)    Creatinine, Ser 1.02 (*)    Total Protein 6.1 (*)    Albumin 2.4 (*)    AST 46 (*)    Alkaline Phosphatase 237 (*)    Total Bilirubin 1.7 (*)    GFR calc non Af Amer 50 (*)    GFR calc Af Amer 58 (*)    Anion gap 16 (*)    All other components within normal limits  CBC - Abnormal; Notable for the following components:   RDW 18.3 (*)    nRBC 0.3 (*)    All other components within normal limits  URINALYSIS, ROUTINE W REFLEX MICROSCOPIC - Abnormal; Notable for the following components:   APPearance HAZY (*)    Ketones, ur 5 (*)    Protein, ur 30 (*)    All other components within normal limits  LIPASE, BLOOD                                                                                                                         EKG  EKG Interpretation  Date/Time:  Sunday April 25 2019 03:46:49 EDT Ventricular Rate:  131 PR Interval:  112 QRS Duration: 84 QT Interval:  322 QTC Calculation: 475 R Axis:   70 Text Interpretation:  Sinus tachycardia Right atrial enlargement Nonspecific ST abnormality Abnormal ECG Confirmed by Addison Lank ([id number]) on 04/25/2019 4:25:12 AM      Radiology No results found.  Pertinent labs & imaging results that were available during my care of the patient were reviewed by me and considered in my medical  decision making (see chart for details).  Medications Ordered in ED Medications  fentaNYL (SUBLIMAZE) injection 50 mcg (50 mcg Intravenous Given 04/25/19 0351)  sodium chloride 0.9 % bolus 1,000 mL (0 mLs Intravenous Stopped 04/25/19 0500)  HYDROmorphone (DILAUDID) injection 0.5 mg (0.5 mg Intravenous Given 04/25/19 0428)  ondansetron (ZOFRAN) injection 4 mg (4 mg Intravenous Given 04/25/19 0433)  iohexol (OMNIPAQUE) 350 MG/ML injection 80 mL (80 mLs Intravenous Contrast Given 04/25/19 0537)  HYDROmorphone (DILAUDID) injection 0.5 mg (0.5 mg Intravenous Given 04/25/19 0654)                                                                                                                                     Procedures .Critical Care Performed by: Fatima Blank, MD Authorized by: Fatima Blank, MD    CRITICAL CARE Performed by: Grayce Sessions Donnovan Stamour Total critical care time: 35 minutes Critical care time was exclusive of separately billable procedures and treating other patients. Critical care was necessary to treat or prevent imminent or life-threatening deterioration. Critical care was time spent personally by me on the following activities: development of treatment plan with patient and/or surrogate as well as nursing, discussions with consultants, evaluation of patient's response to treatment, examination of patient, obtaining history from patient or surrogate, ordering and performing treatments and interventions, ordering and review of laboratory studies, ordering and review of radiographic studies, pulse oximetry and re-evaluation of patient's condition.   (including critical care time)  Medical Decision Making / ED Course I have reviewed the nursing notes for this encounter and the patient's prior records (if available in EHR or on provided paperwork).   Joanne Burke was evaluated in Emergency Department on 04/25/2019 for the symptoms described in the history of present illness. She was evaluated in the context of the global COVID-19 pandemic, which necessitated consideration that the patient might be at risk for infection with the SARS-CoV-2 virus that causes COVID-19. Institutional protocols and algorithms that pertain to the evaluation of patients at risk for COVID-19 are in a state of rapid change based on information released by regulatory bodies including the CDC and federal and state organizations. These policies and algorithms were followed during the patient's care in the ED.  Severe abd pain with lower TTP. SBO vs diverticulitis vs mesenteric ischemia.  Labs w/o leukocytosis. Intact  renal function. Elevated alk phos.  CT with likely perforated diverticulitis with abscess formation. ABx. Surgery consult.  Patient care turned over to Dr Wilson Singer. Patient case and results discussed in detail; please see their note for further ED managment.          This chart was dictated using voice recognition software.  Despite best efforts to proofread,  errors can occur which can change the documentation meaning.     Fatima Blank, MD 04/25/19 [phone]

## 2019-04-25 NOTE — ED Notes (Signed)
ED TO INPATIENT HANDOFF REPORT  ED Nurse Name and Phone #: Tayvian Holycross [phone]  S Name/Age/Gender Joanne Burke 85 y.o. female Room/Bed: 024C/024C  Code Status   Code Status: Full Code  Home/SNF/Other Home Patient oriented to: self, place, time and situation Is this baseline? Yes   Triage Complete: Triage complete  Chief Complaint abdominal pain  Triage Note Pt c/o abd pain x3 weeks along with back pain and dysuria.    Allergies Allergies  Allergen Reactions  . Ancef [Cefazolin] Other (See Comments)    Unknown rxn  . Codeine Nausea And Vomiting    Level of Care/Admitting Diagnosis ED Disposition    ED Disposition Condition [facility]: [facility] [[id number]]  Level of Care: Progressive [102]  Covid Evaluation: Asymptomatic Screening Protocol (No Symptoms)  Diagnosis: Diverticulitis of colon with perforation [737106]  Admitting Physician: Karmen Bongo [[id number]]  Attending Physician: Karmen Bongo [[id number]]  Estimated length of stay: 3 - 4 days  Certification:: I certify this patient will need inpatient services for at least 2 midnights  PT Class (Do Not Modify): Inpatient [101]  PT Acc Code (Do Not Modify): Private [1]       B Medical/Surgery History History reviewed. No pertinent past medical history. Past Surgical History:  Procedure Laterality Date  . ABDOMINAL HYSTERECTOMY    . APPENDECTOMY    . KNEE SURGERY       A IV Location/Drains/Wounds Patient Lines/Drains/Airways Status   Active Line/Drains/Airways    Name:   Placement date:   Placement time:   Site:   Days:   Peripheral IV 04/25/19 Proximal;Right Antecubital   04/25/19    0325    Antecubital   less than 1   Peripheral IV 04/25/19 Left Antecubital   04/25/19    0810    Antecubital   less than 1          Intake/Output Last 24 hours  Intake/Output Summary (Last 24 hours) at 04/25/2019 1322 Last data filed at 04/25/2019 1247 Gross per 24 hour  Intake  2286.48 ml  Output -  Net 2286.48 ml    Labs/Imaging Results for orders placed or performed during the hospital encounter of 04/25/19 (from the past 48 hour(s))  Lipase, blood     Status: None   Collection Time: 04/25/19  3:55 AM  Result Value Ref Range   Lipase 15 11 - 51 U/L    Comment: Performed at [facility], [place] [address] [place], [place] [address]  Comprehensive metabolic panel     Status: Abnormal   Collection Time: 04/25/19  3:55 AM  Result Value Ref Range   Sodium 131 (L) 135 - 145 mmol/L   Potassium 3.6 3.5 - 5.1 mmol/L   Chloride 98 98 - 111 mmol/L   CO2 17 (L) 22 - 32 mmol/L   Glucose, Bld 99 70 - 99 mg/dL   BUN 21 8 - 23 mg/dL   Creatinine, Ser 1.02 (H) 0.44 - 1.00 mg/dL   Calcium 8.9 8.9 - 10.3 mg/dL   Total Protein 6.1 (L) 6.5 - 8.1 g/dL   Albumin 2.4 (L) 3.5 - 5.0 g/dL   AST 46 (H) 15 - 41 U/L   ALT 33 0 - 44 U/L   Alkaline Phosphatase 237 (H) 38 - 126 U/L   Total Bilirubin 1.7 (H) 0.3 - 1.2 mg/dL   GFR calc non Af Amer 50 (L) >60 mL/min   GFR calc Af Amer 58 (L) >  60 mL/min   Anion gap 16 (H) 5 - 15    Comment: Performed at [facility], [address], [place], [place] [address]  CBC     Status: Abnormal   Collection Time: 04/25/19  3:55 AM  Result Value Ref Range   WBC 7.8 4.0 - 10.5 K/uL   RBC 4.69 3.87 - 5.11 MIL/uL   Hemoglobin 13.1 12.0 - 15.0 g/dL   HCT 62.1 30.8 - 65.7 %   MCV 85.3 80.0 - 100.0 fL   MCH 27.9 26.0 - 34.0 pg   MCHC 32.8 30.0 - 36.0 g/dL   RDW 84.6 (H) 96.2 - 95.2 %   Platelets 166 150 - 400 K/uL   nRBC 0.3 (H) 0.0 - 0.2 %    Comment: Performed at [facility], [address], [place], [place] [address]  Urinalysis, Routine w reflex microscopic     Status: Abnormal   Collection Time: 04/25/19  4:22 AM  Result Value Ref Range   Color, Urine YELLOW YELLOW   APPearance HAZY (A) CLEAR   Specific Gravity, Urine 1.016 1.005 - 1.030   pH 5.0 5.0 - 8.0   Glucose, UA NEGATIVE NEGATIVE mg/dL   Hgb urine dipstick  NEGATIVE NEGATIVE   Bilirubin Urine NEGATIVE NEGATIVE   Ketones, ur 5 (A) NEGATIVE mg/dL   Protein, ur 30 (A) NEGATIVE mg/dL   Nitrite NEGATIVE NEGATIVE   Leukocytes,Ua NEGATIVE NEGATIVE   RBC / HPF 0-5 0 - 5 RBC/hpf   WBC, UA 0-5 0 - 5 WBC/hpf   Bacteria, UA NONE SEEN NONE SEEN   Squamous Epithelial / LPF 0-5 0 - 5   Mucus PRESENT    Hyaline Casts, UA PRESENT     Comment: Performed at [facility], [address] [place], [place] [address]   Ct Angio Abd/pel W And/or Wo Contrast  Result Date: 04/25/2019 CLINICAL DATA:  Abdominal pain for 3 weeks. Evaluate for mesenteric ischemia versus diverticulitis. EXAM: CTA ABDOMEN AND PELVIS WITHOUT AND WITH CONTRAST TECHNIQUE: Multidetector CT imaging of the abdomen and pelvis was performed using the standard protocol during bolus administration of intravenous contrast. Multiplanar reconstructed images and MIPs were obtained and reviewed to evaluate the vascular anatomy. CONTRAST:  80mL OMNIPAQUE IOHEXOL 350 MG/ML SOLN COMPARISON:  None. FINDINGS: VASCULAR Aorta: Portions of the abdominal aorta are obscured by beam hardening artifact from the patient's extensive lumbosacral fusion hardware. Within this limitation, advanced atherosclerotic disease is seen in the abdominal aorta without aneurysm or occlusion. Celiac: Patent without evidence for flow limiting stenosis. SMA: Widely patent. Renals: Dense calcific plaque at the origin of the left renal artery with potential flow limiting stenosis. Dense calcific plaque also noted at the origin of the single right renal artery with ostial narrowing, potentially flow limiting IMA: Patent. Inflow: Atherosclerotic but patent. Proximal Outflow: Patent. Veins: Unopacified Review of the MIP images confirms the above findings. NON-VASCULAR Lower chest: Collapse/consolidation noted posterior right lower lobe. Hepatobiliary: No suspicious focal abnormality within the liver parenchyma. There is no evidence for  gallstones, gallbladder wall thickening, or pericholecystic fluid. No intrahepatic or extrahepatic biliary dilation. Pancreas: Mild diffuse distention of the main pancreatic duct. No obstructing mass lesion evident in the head of pancreas. Spleen: No splenomegaly. No focal mass lesion. Adrenals/Urinary Tract: No adrenal nodule or mass. Kidneys unremarkable with inferior left kidney obscured by artifact. Stomach/Bowel: Small hiatal hernia. Stomach is fluid-filled and distended. Duodenum is distended proximally. Duodenal C-loop cannot be seen to cross  the midline suggesting malrotation. Evaluation of the lower abdomen and upper pelvis is markedly limited due to the artifact. Within this limitation, there is diffusely abnormal small bowel, dilated up to 3.2 cm diameter in the upper abdomen. Colonic loops are not clearly demonstrated in the right abdomen although the descending colon and sigmoid colon are visualized. There is some apparent wall thickening in the sigmoid colon. There is diffuse fluid in the abdomen with mesenteric and omental edema. Diffuse peritoneal enhancement is associated. There is an 11.4 x 2.4 x 2.1 cm collection of fluid and gas in the left pelvis that appears extraluminal with rim enhancement (see axial 56/12 and coronal 61/15). Rim enhancing fluid collection noted in the right groin extending into a small right groin hernia. This is probably extraluminal as well although could potentially be small-bowel. Bowel herniation into a right groin hernia is evident on axial 64/12. Rim enhancing fluid is identified anteriorly in the left abdomen but largely obscured by artifact. Lymphatic: No definite abdominal lymphadenopathy although large portions of the retroperitoneum are obscured. No pelvic sidewall lymphadenopathy Reproductive: Unremarkable. Other: None. Musculoskeletal: Extensive lumbosacral fusion hardware posteriorly. IMPRESSION: VASCULAR Atherosclerotic disease without evidence for aortic  occlusion. Mesenteric arterial anatomy is patent. NON-VASCULAR Extensive fluid throughout the abdomen with associated peritoneal enhancement. Assessment is dramatically limited due to the substantial beam hardening artifact arising from the extensive lumbosacral fusion hardware. Within this marked limitation there appears to be a relatively large rim enhancing collection of fluid and gas in the left pelvis suspicious for an abscess. If this is an abscess, the remaining complex mesenteric fluid and peritoneal enhancement is concerning for diffuse peritonitis. Peritoneal carcinomatosis would be a consideration except the extraluminal collection of gas and fluid in the left pelvis with not be consistent with that diagnosis. Etiology for the apparent pelvic abscess and peritoneal disease is not discernible on this degraded study although the patient is noted to have a small right groin hernia containing a bowel loop and small bowel or fluid in a left groin hernia. Diverticular disease is noted in the left colon and perforated diverticulitis would be a consideration. An area of apparent circumferential wall thickening in the sigmoid colon could be diverticular disease or related to neoplasm. Duodenum cannot be seen to cross the midline suggesting underlying malrotation. I discussed these findings with Dr. Eudelia Bunchardama in the emergency department at approximately 0730 hours on 04/25/2019 Electronically Signed   By: Kennith CenterEric  Mansell M.D.   On: 04/25/2019 07:02    Pending Labs Unresulted Labs (From admission, onward)    Start     Ordered   04/26/19 0500  Basic metabolic panel  Tomorrow morning,   R     04/25/19 1109   04/26/19 0500  CBC  Tomorrow morning,   R     04/25/19 1109   04/25/19 1048  SARS CORONAVIRUS 2 (TAT 6-24 HRS) Nasopharyngeal Nasopharyngeal Swab  (Asymptomatic/Tier 2 Patients Labs)  Once,   STAT    Question Answer Comment  Is this test for diagnosis or screening Screening   Symptomatic for COVID-19 as  defined by CDC No   Hospitalized for COVID-19 No   Admitted to ICU for COVID-19 No   Previously tested for COVID-19 No   Resident in a congregate (group) care setting No   Employed in healthcare setting No   Pregnant No      04/25/19 1048   04/25/19 1032  Lactic acid, plasma  Now then every 2 hours,   STAT  04/25/19 1031   04/25/19 0828  CEA  Add-on,   AD     04/25/19 0828          Vitals/Pain Today's Vitals   04/25/19 1200 04/25/19 1215 04/25/19 1222 04/25/19 1230  BP: (!) 86/73 (!) 84/59  (!) 91/57  Pulse: 100 99 99 97  Resp: (!) 22 20 (!) 22 20  Temp:      TempSrc:      SpO2:  90% 94% 94%  Weight:      Height:      PainSc:        Isolation Precautions No active isolations  Medications Medications  dextrose 5 % and 0.9 % NaCl with KCl 20 mEq/L infusion ( Intravenous New Bag/Given 04/25/19 1001)  ciprofloxacin (CIPRO) IVPB 200 mg (has no administration in time range)  metroNIDAZOLE (FLAGYL) IVPB 500 mg (has no administration in time range)  naloxone (NARCAN) injection 0.4 mg (has no administration in time range)    And  sodium chloride flush (NS) 0.9 % injection 9 mL (has no administration in time range)  ondansetron (ZOFRAN) injection 4 mg (has no administration in time range)  diphenhydrAMINE (BENADRYL) injection 12.5 mg (has no administration in time range)    Or  diphenhydrAMINE (BENADRYL) 12.5 MG/5ML elixir 12.5 mg (has no administration in time range)  acetaminophen (TYLENOL) tablet 650 mg (has no administration in time range)    Or  acetaminophen (TYLENOL) suppository 650 mg (has no administration in time range)  morphine 2 mg/mL PCA injection (has no administration in time range)  enoxaparin (LOVENOX) injection 30 mg (has no administration in time range)  sodium chloride flush (NS) 0.9 % injection 3 mL (3 mLs Intravenous Not Given 04/25/19 1130)  fentaNYL (SUBLIMAZE) injection 50 mcg (50 mcg Intravenous Given 04/25/19 0351)  sodium chloride 0.9 % bolus  1,000 mL (0 mLs Intravenous Stopped 04/25/19 0500)  HYDROmorphone (DILAUDID) injection 0.5 mg (0.5 mg Intravenous Given 04/25/19 0428)  ondansetron (ZOFRAN) injection 4 mg (4 mg Intravenous Given 04/25/19 0433)  iohexol (OMNIPAQUE) 350 MG/ML injection 80 mL (80 mLs Intravenous Contrast Given 04/25/19 0537)  HYDROmorphone (DILAUDID) injection 0.5 mg (0.5 mg Intravenous Given 04/25/19 0654)  ciprofloxacin (CIPRO) IVPB 400 mg (0 mg Intravenous Stopped 04/25/19 0921)  metroNIDAZOLE (FLAGYL) IVPB 500 mg (0 mg Intravenous Stopped 04/25/19 0924)  morphine 4 MG/ML injection 4 mg (4 mg Intravenous Given 04/25/19 0820)  sodium chloride 0.9 % bolus 1,000 mL (0 mLs Intravenous Stopped 04/25/19 1247)    Mobility walks Moderate fall risk   Focused Assessments    R Recommendations: See Admitting Provider Note  Report given to:   Additional Notes:

## 2019-04-25 NOTE — Progress Notes (Signed)
Pt give PRN Tylenol. Spoke with MD about concerns for PCA pump. MD aware and stated that medication is low dose and is appropriate for pt. Will set up PCA and continue to monitor.

## 2019-04-25 NOTE — H&P (Signed)
History and Physical    Joanne Burke ZOX:[id number] DOB: 03/22/1931 DOA: 04/25/2019  PCP: [facility]ev[facility] Consultants:  None Patient coming from:  Home - lives with husband; NOK: Joanne Burke, [phone]  Chief Complaint: abdominal pain  HPI: Joanne Burke is a 85 y.o. female with no significant medical history presenting with abdominal pain.   She had abdominal pain in the summer and it resolved; she had swelling at the time.  About 9 days ago, she started with LLQ pain with n/v, decreased PO.  She has not been to the doctor in years but the pain was finally so bad that she had to come.  No fevers.    I spoke with her husband. She has had pretty good health.  About 2-3 weeks she developed abdominal pain - but she has had chronic LLQ pain for a couple of years and she refused to go to a doctor.  It got worse and she started laying on the couch and not eating.  He begged her to go to the ER yesterday - she was crying in pain and unable to tolerate even water.  They are concerned that she could have cancer, "it looks like she just gave up and wanted to die and it was breaking my heart."  He reports that her BP was good until about 2-3 days ago and then it was really high.  This morning about 0200 her BP was 200/103.  ED Course:  Pelvic abscess, possibly from diverticulitis.  Surgery will consult.  Given Cipro/Flagyl.    Review of Systems: As per HPI; otherwise review of systems reviewed and negative.   Ambulatory Status:  Ambulates without assistance  History reviewed. No pertinent past medical history.  Past Surgical History:  Procedure Laterality Date  . ABDOMINAL HYSTERECTOMY    . APPENDECTOMY    . KNEE SURGERY      Social History   Socioeconomic History  . Marital status: Single    Spouse name: Not on file  . Number of children: Not on file  . Years of education: Not on file  . Highest education level: Not on file  Occupational History  . Not on file  Social Needs  . Financial  resource strain: Not on file  . Food insecurity    Worry: Not on file    Inability: Not on file  . Transportation needs    Medical: Not on file    Non-medical: Not on file  Tobacco Use  . Smoking status: Current Every Day Smoker  . Smokeless tobacco: Never Used  . Tobacco comment: quit 5 days ago  Substance and Sexual Activity  . Alcohol use: Never    Frequency: Never  . Drug use: Never  . Sexual activity: Not on file  Lifestyle  . Physical activity    Days per week: Not on file    Minutes per session: Not on file  . Stress: Not on file  Relationships  . Social Musician on phone: Not on file    Gets together: Not on file    Attends religious service: Not on file    Active member of club or organization: Not on file    Attends meetings of clubs or organizations: Not on file    Relationship status: Not on file  . Intimate partner violence    Fear of current or ex partner: Not on file    Emotionally abused: Not on file    Physically abused: Not on  file    Forced sexual activity: Not on file  Other Topics Concern  . Not on file  Social History Narrative  . Not on file    Allergies  Allergen Reactions  . Ancef [Cefazolin] Other (See Comments)    Unknown rxn  . Codeine Nausea And Vomiting    History reviewed. No pertinent family history.  Prior to Admission medications   Medication Sig Start Date End Date Taking? Authorizing Provider  acetaminophen (TYLENOL) 500 MG tablet Take 1,000 mg by mouth every 6 (six) hours as needed for mild pain.    [provider]  ondansetron (ZOFRAN) 4 MG tablet Take 1 tablet (4 mg total) by mouth every 6 (six) hours. 10/28/18   Gilda Crease, MD  oxyCODONE-acetaminophen (PERCOCET) 5-325 MG tablet Take 0.5 tablets by mouth every 6 (six) hours as needed. 10/28/18   Gilda Crease, MD    Physical Exam: Vitals:   04/25/19 1330 04/25/19 1345 04/25/19 1438 04/25/19 1517  BP: 91/60 93/66 101/78   Pulse: 100  (!) 102 (!) 106 100  Resp: (!) 22 (!) 27 (!) 28 20  Temp:   (!) 97.5 F (36.4 C)   TempSrc:   Oral   SpO2: 99% 94% 95% 97%  Weight:   42.9 kg   Height:   5\' 2"  (1.575 m)      . General:  Crying out in pain despite pain meds in the ER . Eyes:  PERRL, EOMI, normal lids, iris . ENT:  grossly normal hearing, lips & tongue, moderately dry mm . Neck:  no LAD, masses or thyromegaly . Cardiovascular:  RR with tachycardia, no m/r/g. No LE edema.  Respiratory:   CTA bilaterally with no wheezes/rales/rhonchi.  Mildly increased respiratory effort with mild hypoxia after pain medication. . Abdomen:  Exquisitely TTP particularly along left side LLQ > LUQ . Skin:  no rash or induration seen on limited exam . Musculoskeletal:  grossly normal tone BUE/BLE, good ROM, no bony abnormality . Psychiatric:  blunted mood and affect, speech fluent and appropriate, AOx3 . Neurologic:  CN 2-12 grossly intact, moves all extremities in coordinated fashion    Radiological Exams on Admission: Ct Angio Abd/pel W And/or Wo Contrast  Result Date: 04/25/2019 CLINICAL DATA:  Abdominal pain for 3 weeks. Evaluate for mesenteric ischemia versus diverticulitis. EXAM: CTA ABDOMEN AND PELVIS WITHOUT AND WITH CONTRAST TECHNIQUE: Multidetector CT imaging of the abdomen and pelvis was performed using the standard protocol during bolus administration of intravenous contrast. Multiplanar reconstructed images and MIPs were obtained and reviewed to evaluate the vascular anatomy. CONTRAST:  54mL OMNIPAQUE IOHEXOL 350 MG/ML SOLN COMPARISON:  None. FINDINGS: VASCULAR Aorta: Portions of the abdominal aorta are obscured by beam hardening artifact from the patient's extensive lumbosacral fusion hardware. Within this limitation, advanced atherosclerotic disease is seen in the abdominal aorta without aneurysm or occlusion. Celiac: Patent without evidence for flow limiting stenosis. SMA: Widely patent. Renals: Dense calcific plaque at the  origin of the left renal artery with potential flow limiting stenosis. Dense calcific plaque also noted at the origin of the single right renal artery with ostial narrowing, potentially flow limiting IMA: Patent. Inflow: Atherosclerotic but patent. Proximal Outflow: Patent. Veins: Unopacified Review of the MIP images confirms the above findings. NON-VASCULAR Lower chest: Collapse/consolidation noted posterior right lower lobe. Hepatobiliary: No suspicious focal abnormality within the liver parenchyma. There is no evidence for gallstones, gallbladder wall thickening, or pericholecystic fluid. No intrahepatic or extrahepatic biliary dilation. Pancreas: Mild diffuse  distention of the main pancreatic duct. No obstructing mass lesion evident in the head of pancreas. Spleen: No splenomegaly. No focal mass lesion. Adrenals/Urinary Tract: No adrenal nodule or mass. Kidneys unremarkable with inferior left kidney obscured by artifact. Stomach/Bowel: Small hiatal hernia. Stomach is fluid-filled and distended. Duodenum is distended proximally. Duodenal C-loop cannot be seen to cross the midline suggesting malrotation. Evaluation of the lower abdomen and upper pelvis is markedly limited due to the artifact. Within this limitation, there is diffusely abnormal small bowel, dilated up to 3.2 cm diameter in the upper abdomen. Colonic loops are not clearly demonstrated in the right abdomen although the descending colon and sigmoid colon are visualized. There is some apparent wall thickening in the sigmoid colon. There is diffuse fluid in the abdomen with mesenteric and omental edema. Diffuse peritoneal enhancement is associated. There is an 11.4 x 2.4 x 2.1 cm collection of fluid and gas in the left pelvis that appears extraluminal with rim enhancement (see axial 56/12 and coronal 61/15). Rim enhancing fluid collection noted in the right groin extending into a small right groin hernia. This is probably extraluminal as well although  could potentially be small-bowel. Bowel herniation into a right groin hernia is evident on axial 64/12. Rim enhancing fluid is identified anteriorly in the left abdomen but largely obscured by artifact. Lymphatic: No definite abdominal lymphadenopathy although large portions of the retroperitoneum are obscured. No pelvic sidewall lymphadenopathy Reproductive: Unremarkable. Other: None. Musculoskeletal: Extensive lumbosacral fusion hardware posteriorly. IMPRESSION: VASCULAR Atherosclerotic disease without evidence for aortic occlusion. Mesenteric arterial anatomy is patent. NON-VASCULAR Extensive fluid throughout the abdomen with associated peritoneal enhancement. Assessment is dramatically limited due to the substantial beam hardening artifact arising from the extensive lumbosacral fusion hardware. Within this marked limitation there appears to be a relatively large rim enhancing collection of fluid and gas in the left pelvis suspicious for an abscess. If this is an abscess, the remaining complex mesenteric fluid and peritoneal enhancement is concerning for diffuse peritonitis. Peritoneal carcinomatosis would be a consideration except the extraluminal collection of gas and fluid in the left pelvis with not be consistent with that diagnosis. Etiology for the apparent pelvic abscess and peritoneal disease is not discernible on this degraded study although the patient is noted to have a small right groin hernia containing a bowel loop and small bowel or fluid in a left groin hernia. Diverticular disease is noted in the left colon and perforated diverticulitis would be a consideration. An area of apparent circumferential wall thickening in the sigmoid colon could be diverticular disease or related to neoplasm. Duodenum cannot be seen to cross the midline suggesting underlying malrotation. I discussed these findings with Dr. Leonette Monarch in the emergency department at approximately 0730 hours on 04/25/2019 Electronically  Signed   By: Misty Stanley M.D.   On: 04/25/2019 07:02    EKG: Independently reviewed.  NSR with rate 131; nonspecific ST changes which are likely rate related  Labs on Admission: I have personally reviewed the available labs and imaging studies at the time of the admission.  Pertinent labs:   Na++ 131 CO2 17 BUN 21/Creatinine 1.02/GFR 50 Anion gap 16 AP 237 Albumin 2.4 AST 46/ALT 33/Bili 1.7 Unremarkable CBC UA: 5 ketones; 30 protein   Assessment/Plan Principal Problem:   Diverticulitis of colon with perforation Active Problems:   Sepsis ([place])   -Patient without routine medical care and no known medical problems presenting with acute on chronic abdominal pain --SIRS criteria in this patient includes:  Tachycardia,  tachypnea, and hypoxia -Patient has evidence of acute organ failure with elevated lactate and borderline hypotension -While awaiting blood cultures, this appears to be a preseptic condition. -Sepsis protocol initiated -Patient had SBP <90/MAP <65 and so has received the 30 cc/kg IVF bolus. -Suspected source is peritonitis from diverticulitis with perforation -Blood and urine cultures pending -Will admit due to: hemodynamic instability; severity of underlying condition -Will trend lactate to ensure improvement -Will order sepsis protocol procalcitonin level.  Antibiotics would not be indicated for PCT <0.1 and probably should not be used for < 0.25.  >0.5 indicates infection and >>0.5 indicates more serious disease.  As the procalcitonin level normalizes, it will be reasonable to consider de-escalation of antibiotic coverage. -Per the sepsis order set algorithm, she has an intraabdominal source with an unspecified cephalosporin allergy and has severe disease based on presumed peritoneal involvement as well as perforation; as such, she would be recommended to receive Aztreonam (likely a better choice than Cipro in this 85yo) and Flagyl -CT was markedly abnormal but  nonspecific - there is extensive fluid with peritoneal enhancement which may be c/w peritonitis as well as what appears to be a large pelvic abscess; peritoneal carcinomatosis and/or sigmoid neoplasm is also a consideration as well as diverticulitis with perforation.  There also may be duodenal malrotation. -As a result of this markedly abnormal CT, both surgery and IR were consulted. -IR reports that there is currently nothing to drain and recommends repeat CT tomorrow with PO/IV contrast and no current plans for intervention. -Surgery has consulted and is planning to repeat the CT tomorrow; flex sig may also be indicated to evaluate for an obstructing tumor. -For now, will give bowel rest, IVF, pain medication with low-dose morphine PCA, nausea medication with Zofran, and treat with antibiotics as above for intraabdominal infection    Note: This patient has been tested and is negative for the novel coronavirus COVID-19.  DVT prophylaxis:  Lovenox Code Status:  Full - confirmed with patient/family Family Communication: None present; I spoke with her husband by telephone  Disposition Plan:  Home once clinically improved Consults called: GI; IR Admission status: Admit - It is my clinical opinion that admission to INPATIENT is reasonable and necessary because of the expectation that this patient will require hospital care that crosses at least 2 midnights to treat this condition based on the medical complexity of the problems presented.  Given the aforementioned information, the predictability of an adverse outcome is felt to be significant.    Jonah BlueJennifer Boone Gear MD [facility]   How to contact the [facility] Attending or Consulting provider 7A - 7P or covering provider during after hours 7P -7A, for this patient?  1. Check the [facility] in [facility] and look for a) attending/consulting [facility] provider listed and b) the [facility] team listed 2. Log into [email or web address] and use 's universal password to  access. If you do not have the password, please contact the hospital operator. 3. Locate the [facility] provider you are looking for under [facility] and page to a number that you can be directly reached. 4. If you still have difficulty reaching the provider, please page the [facility] (Director on Call) for the Hospitalists listed on amion for assistance.   04/25/2019, 4:32 PM

## 2019-04-25 NOTE — Consult Note (Signed)
[facility]  Joanne CroftJoann A Burke 06-17-1931  [id number].    Requesting MD: Baldemar FridaySteven Burke Chief Complaint/Reason for Consult: pelvic fluid collection  HPI:  Joanne Burke is an 85yo female with h/o constipation who [facility] complaining of 7 days of lower abdominal pain. States that she had a similar episode at the beginning of the summer, but it resolved without intervention. Her pain has gotten significantly worse over the last 2 days. Pain is constant, severe, worse with movement or palpation. She reports feeling a bulge in her LLQ. She felt this with her prior episode, but it also resolved when her pain resolved. Associated symptoms include nausea, vomiting, diarrhea and constipation. Uses suppositories PRN for baseline constipation. Denies fever or chills. ED workup included CTA which shows 11.4 x 2.4 x 2.1 cm collection of fluid and gas in the left pelvis that appears extraluminal with rim enhancement, etiology difficult to discern but could be diverticular disease or possible colonic neoplasm. WBC 7.8, creatinine 1.02. TMAX 99.3, tachycardic at 103bpm, BP 98/66 Patient was started on cipro/flagyl, general surgery asked to consult.  Abdominal surgical history: open appendectomy, hysterectomy Family history: grandfather with colon cancer in his 6780's Never had a colonoscopy Anticoagulants: none Smokes almost 1 PPD Lives at home with her husband. Does not use assistive device for ambulation. Very independent, drives, does yard work  ROS: Review of Systems  Constitutional: Positive for diaphoresis. Negative for chills and fever.  HENT: Negative.   Eyes: Negative.   Respiratory: Negative.   Cardiovascular: Negative.   Gastrointestinal: Positive for abdominal pain, constipation, diarrhea, nausea and vomiting.  Genitourinary: Negative.   Musculoskeletal: Negative.   Skin: Negative.   Neurological: Negative.     All systems reviewed and otherwise  negative except for as above  No family history on file.  No past medical history on file.  Past Surgical History:  Procedure Laterality Date  . ABDOMINAL HYSTERECTOMY    . APPENDECTOMY    . KNEE SURGERY      Social History:  reports that she has never smoked. She has never used smokeless tobacco. She reports that she does not drink alcohol or use drugs.  Allergies:  Allergies  Allergen Reactions  . Ancef [Cefazolin] Other (See Comments)    Unknown rxn  . Codeine     (Not in a hospital admission)   Prior to Admission medications   Medication Sig Start Date End Date Taking? Authorizing Provider  acetaminophen (TYLENOL) 500 MG tablet Take 1,000 mg by mouth every 6 (six) hours as needed for mild pain.    [provider]  ondansetron (ZOFRAN) 4 MG tablet Take 1 tablet (4 mg total) by mouth every 6 (six) hours. 10/28/18   Gilda CreasePollina, Christopher J, MD  oxyCODONE-acetaminophen (PERCOCET) 5-325 MG tablet Take 0.5 tablets by mouth every 6 (six) hours as needed. 10/28/18   Gilda CreasePollina, Christopher J, MD    Blood pressure 98/66, pulse (!) 103, temperature 99.3 F (37.4 C), temperature source Oral, resp. rate (!) 27, height 5\' 2"  (1.575 m), weight 40.8 kg, SpO2 97 %. Physical Exam: General: pleasant, frail white female who is laying in bed in NAD but is diaphoretic HEENT: head is normocephalic, atraumatic.  Sclera are noninjected.  Pupils equal and round.  Ears and nose without any masses or lesions.  Mouth is pink and moist. Dentition fair Heart: regular, rate, and rhythm.  No obvious murmurs, gallops, or rubs noted.  Palpable pedal pulses bilaterally Lungs: CTAB, no  wheezes, rhonchi, or rales noted.  Respiratory effort nonlabored Abd: well healed midline and RLQ incisions, mostly soft, mild distension, tender firm area palpable in the LLQ with guarding, +BS, no peritonitis MS: calves soft and nontender without edema Skin: warm and dry with no masses, lesions, or rashes Psych: A&Ox3  with an appropriate affect. Neuro: cranial nerves grossly intact, extremity CSM intact bilaterally, normal speech  Results for orders placed or performed during the hospital encounter of 04/25/19 (from the past 48 hour(s))  Lipase, blood     Status: None   Collection Time: 04/25/19  3:55 AM  Result Value Ref Range   Lipase 15 11 - 51 U/L    Comment: Performed at [facility], [address] [place], [place] [address]  Comprehensive metabolic panel     Status: Abnormal   Collection Time: 04/25/19  3:55 AM  Result Value Ref Range   Sodium 131 (L) 135 - 145 mmol/L   Potassium 3.6 3.5 - 5.1 mmol/L   Chloride 98 98 - 111 mmol/L   CO2 17 (L) 22 - 32 mmol/L   Glucose, Bld 99 70 - 99 mg/dL   BUN 21 8 - 23 mg/dL   Creatinine, Ser 1.02 (H) 0.44 - 1.00 mg/dL   Calcium 8.9 8.9 - 10.3 mg/dL   Total Protein 6.1 (L) 6.5 - 8.1 g/dL   Albumin 2.4 (L) 3.5 - 5.0 g/dL   AST 46 (H) 15 - 41 U/L   ALT 33 0 - 44 U/L   Alkaline Phosphatase 237 (H) 38 - 126 U/L   Total Bilirubin 1.7 (H) 0.3 - 1.2 mg/dL   GFR calc non Af Amer 50 (L) >60 mL/min   GFR calc Af Amer 58 (L) >60 mL/min   Anion gap 16 (H) 5 - 15    Comment: Performed at [facility], [place] [address], [place], [place] [address]  CBC     Status: Abnormal   Collection Time: 04/25/19  3:55 AM  Result Value Ref Range   WBC 7.8 4.0 - 10.5 K/uL   RBC 4.69 3.87 - 5.11 MIL/uL   Hemoglobin 13.1 12.0 - 15.0 g/dL   HCT 40.0 36.0 - 46.0 %   MCV 85.3 80.0 - 100.0 fL   MCH 27.9 26.0 - 34.0 pg   MCHC 32.8 30.0 - 36.0 g/dL   RDW 18.3 (H) 11.5 - 15.5 %   Platelets 166 150 - 400 K/uL   nRBC 0.3 (H) 0.0 - 0.2 %    Comment: Performed at [facility], [place] [address] [place], [place] [address]  Urinalysis, Routine w reflex microscopic     Status: Abnormal   Collection Time: 04/25/19  4:22 AM  Result Value Ref Range   Color, Urine YELLOW YELLOW   APPearance HAZY (A) CLEAR   Specific Gravity, Urine 1.016 1.005 - 1.030   pH 5.0 5.0 - 8.0    Glucose, UA NEGATIVE NEGATIVE mg/dL   Hgb urine dipstick NEGATIVE NEGATIVE   Bilirubin Urine NEGATIVE NEGATIVE   Ketones, ur 5 (A) NEGATIVE mg/dL   Protein, ur 30 (A) NEGATIVE mg/dL   Nitrite NEGATIVE NEGATIVE   Leukocytes,Ua NEGATIVE NEGATIVE   RBC / HPF 0-5 0 - 5 RBC/hpf   WBC, UA 0-5 0 - 5 WBC/hpf   Bacteria, UA NONE SEEN NONE SEEN   Squamous Epithelial / LPF 0-5 0 - 5   Mucus PRESENT    Hyaline Casts, UA PRESENT     Comment:  Performed at [facility], [address], [place], [place] [address]   Ct Angio Abd/pel W And/or Wo Contrast  Result Date: 04/25/2019 CLINICAL DATA:  Abdominal pain for 3 weeks. Evaluate for mesenteric ischemia versus diverticulitis. EXAM: CTA ABDOMEN AND PELVIS WITHOUT AND WITH CONTRAST TECHNIQUE: Multidetector CT imaging of the abdomen and pelvis was performed using the standard protocol during bolus administration of intravenous contrast. Multiplanar reconstructed images and MIPs were obtained and reviewed to evaluate the vascular anatomy. CONTRAST:  33mL OMNIPAQUE IOHEXOL 350 MG/ML SOLN COMPARISON:  None. FINDINGS: VASCULAR Aorta: Portions of the abdominal aorta are obscured by beam hardening artifact from the patient's extensive lumbosacral fusion hardware. Within this limitation, advanced atherosclerotic disease is seen in the abdominal aorta without aneurysm or occlusion. Celiac: Patent without evidence for flow limiting stenosis. SMA: Widely patent. Renals: Dense calcific plaque at the origin of the left renal artery with potential flow limiting stenosis. Dense calcific plaque also noted at the origin of the single right renal artery with ostial narrowing, potentially flow limiting IMA: Patent. Inflow: Atherosclerotic but patent. Proximal Outflow: Patent. Veins: Unopacified Review of the MIP images confirms the above findings. NON-VASCULAR Lower chest: Collapse/consolidation noted posterior right lower lobe. Hepatobiliary: No suspicious focal abnormality  within the liver parenchyma. There is no evidence for gallstones, gallbladder wall thickening, or pericholecystic fluid. No intrahepatic or extrahepatic biliary dilation. Pancreas: Mild diffuse distention of the main pancreatic duct. No obstructing mass lesion evident in the head of pancreas. Spleen: No splenomegaly. No focal mass lesion. Adrenals/Urinary Tract: No adrenal nodule or mass. Kidneys unremarkable with inferior left kidney obscured by artifact. Stomach/Bowel: Small hiatal hernia. Stomach is fluid-filled and distended. Duodenum is distended proximally. Duodenal C-loop cannot be seen to cross the midline suggesting malrotation. Evaluation of the lower abdomen and upper pelvis is markedly limited due to the artifact. Within this limitation, there is diffusely abnormal small bowel, dilated up to 3.2 cm diameter in the upper abdomen. Colonic loops are not clearly demonstrated in the right abdomen although the descending colon and sigmoid colon are visualized. There is some apparent wall thickening in the sigmoid colon. There is diffuse fluid in the abdomen with mesenteric and omental edema. Diffuse peritoneal enhancement is associated. There is an 11.4 x 2.4 x 2.1 cm collection of fluid and gas in the left pelvis that appears extraluminal with rim enhancement (see axial 56/12 and coronal 61/15). Rim enhancing fluid collection noted in the right groin extending into a small right groin hernia. This is probably extraluminal as well although could potentially be small-bowel. Bowel herniation into a right groin hernia is evident on axial 64/12. Rim enhancing fluid is identified anteriorly in the left abdomen but largely obscured by artifact. Lymphatic: No definite abdominal lymphadenopathy although large portions of the retroperitoneum are obscured. No pelvic sidewall lymphadenopathy Reproductive: Unremarkable. Other: None. Musculoskeletal: Extensive lumbosacral fusion hardware posteriorly. IMPRESSION: VASCULAR  Atherosclerotic disease without evidence for aortic occlusion. Mesenteric arterial anatomy is patent. NON-VASCULAR Extensive fluid throughout the abdomen with associated peritoneal enhancement. Assessment is dramatically limited due to the substantial beam hardening artifact arising from the extensive lumbosacral fusion hardware. Within this marked limitation there appears to be a relatively large rim enhancing collection of fluid and gas in the left pelvis suspicious for an abscess. If this is an abscess, the remaining complex mesenteric fluid and peritoneal enhancement is concerning for diffuse peritonitis. Peritoneal carcinomatosis would be a consideration except the extraluminal collection of gas and fluid in the left pelvis with  not be consistent with that diagnosis. Etiology for the apparent pelvic abscess and peritoneal disease is not discernible on this degraded study although the patient is noted to have a small right groin hernia containing a bowel loop and small bowel or fluid in a left groin hernia. Diverticular disease is noted in the left colon and perforated diverticulitis would be a consideration. An area of apparent circumferential wall thickening in the sigmoid colon could be diverticular disease or related to neoplasm. Duodenum cannot be seen to cross the midline suggesting underlying malrotation. I discussed these findings with Dr. Eudelia Bunch in the emergency department at approximately 0730 hours on 04/25/2019 Electronically Signed   By: Kennith Center M.D.   On: 04/25/2019 07:02    Anti-infectives (From admission, onward)   Start     Dose/Rate Route Frequency Ordered Stop   04/25/19 0730  ciprofloxacin (CIPRO) IVPB 400 mg     400 mg 200 mL/hr over 60 Minutes Intravenous  Once 04/25/19 0729     04/25/19 0730  metroNIDAZOLE (FLAGYL) IVPB 500 mg     500 mg 100 mL/hr over 60 Minutes Intravenous  Once 04/25/19 0981         Assessment/Plan Constipation Tobacco abuse AKI  Pelvic fluid  collection - 1 week of worsening lower abdominal pain, 1 prior episode in early summer that resolved without intervention - she has never had a colonoscopy - CTA shows 11.4 x 2.4 x 2.1 cm collection of fluid and gas in the left pelvis that appears extraluminal with rim enhancement, etiology difficult to discern but could be diverticular disease or possible colonic neoplasm - Agree with medicine admission, IV antibiotics, pain control. Will ask IR to evaluate and see if they could place a drain. Will continue to follow.  ID - cipro/flagyl VTE - SCDs FEN - IVF, NPO Foley - none Follow up - TBD  Franne Forts, [facility] 04/25/2019, 8:16 AM Pager: [phone] Mon-Thurs 7:00 am-4:30 pm Fri 7:00 am -11:30 AM Sat-Sun 7:00 am-11:30 am

## 2019-04-25 NOTE — ED Notes (Signed)
PureWick placed.

## 2019-04-25 NOTE — ED Notes (Signed)
[phone] pts husband Rush Landmark wants a call with pt condition

## 2019-04-25 NOTE — ED Triage Notes (Signed)
Pt c/o abd pain x3 weeks along with back pain and dysuria.

## 2019-04-25 NOTE — Progress Notes (Signed)
IR consulted by Margie Billet, PA-C ([facility]) for possible aspiration/drainage of pelvic fluid collection.  CT abdomen/pelvis from today reviewed by Dr. Kathlene Cote who states there is currently nothing to drain. ?possible ascites in pelvis. Recommends repeat CT abdomen/pelvis with oral and IV contrast in AM. No plans for intervention at this time. Will delete order. Jerene Pitch, PA-C made aware.  IR available in future if needed.   Bea Graff Naketa Daddario, PA-C 04/25/2019, 9:22 AM

## 2019-04-25 NOTE — Progress Notes (Addendum)
Pt has orders for Q4 morphine on PCA pump. This nurse does not feel comfortable administering this due to pt being hypotensive. Gave pt heat pack for abdomen. MD paged. Will continue to monitor.

## 2019-04-25 NOTE — Progress Notes (Signed)
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 female patient admitted from ED awake, alert  & orientated  X 3,  Full Code, VSS - Blood pressure 101/78, pulse (!) 106, temperature (!) 97.5 F (36.4 C), temperature source Oral, resp. rate (!) 28, height 5\' 2"  (1.575 m), weight 42.9 kg, SpO2 95 %., O2    2 L nasal cannular, no c/o shortness of breath, no c/o chest pain, no distress noted. Tele # M 10 placed and pt is currently running:normal sinus rhythm.   IV site WDL:  antecubital right, condition patent and no redness and left, condition patent and no redness with a transparent dsg that's clean dry and intact.  Allergies:   Allergies  Allergen Reactions  . Ancef [Cefazolin] Other (See Comments)    Unknown rxn  . Codeine Nausea And Vomiting     History reviewed. No pertinent past medical history.  History:  obtained from the patient. Tobacco/alcohol: Smoked .5 packs per day for 30 years none  Pt orientation to unit, room and routine. Information packet given to patient/family and safety video watched.  Admission INP armband ID verified with patient/family, and in place. SR up x 2, fall risk assessment complete with Patient and family verbalizing understanding of risks associated with falls. Pt verbalizes an understanding of how to use the call bell and to call for help before getting out of bed.  Skin, clean-dry- intact without evidence of bruising, or skin tears.   No evidence of skin break down noted on exam. no rashes, no ecchymoses, no petechiae    Will cont to monitor and assist as needed.  Delorise Hunkele Margaretha Sheffield, RN 04/25/2019 2:49 PM

## 2019-04-25 NOTE — Progress Notes (Signed)
Pharmacy Antibiotic Note  Joanne Burke is a 85 y.o. female admitted on 04/25/2019 with abdominal pain and found to have pelvic fluid collection. Pharmacy has been consulted for aztreonam dosing. Cr ~1, baseline unknown, pt received ciprofloxacin and metronidazole x1 in the ED.  Plan: Aztreonam 1g IV q8h Metronidazole per MD Monitor cultures, LOT, renal function   Height: 5\' 2"  (157.5 cm) Weight: 94 lb 9.2 oz (42.9 kg) IBW/kg (Calculated) : 50.1  Temp (24hrs), Avg:98.4 F (36.9 C), Min:97.5 F (36.4 C), Max:99.3 F (37.4 C)  Recent Labs  Lab 04/25/19 0355 04/25/19 1252 04/25/19 1513  WBC 7.8  --   --   CREATININE 1.02*  --   --   LATICACIDVEN  --  3.1* 3.2*    Estimated Creatinine Clearance: 26.8 mL/min (A) (by C-G formula based on SCr of 1.02 mg/dL (H)).    Allergies  Allergen Reactions  . Ancef [Cefazolin] Other (See Comments)    Unknown rxn  . Codeine Nausea And Vomiting    Antimicrobials this admission: Ciprofloxacin 9/27 x1 Metronidazole 9/27 >> Aztreonam 9/27 >>   Microbiology results: none  Thank you for allowing pharmacy to be a part of this patient's care.   Arrie Senate, PharmD, BCPS Clinical Pharmacist [phone] Please check AMION for all [place] numbers 04/25/2019

## 2019-04-26 ENCOUNTER — Inpatient Hospital Stay (HOSPITAL_COMMUNITY): Payer: Medicare HMO

## 2019-04-26 DIAGNOSIS — K5720 Diverticulitis of large intestine with perforation and abscess without bleeding: Secondary | ICD-10-CM

## 2019-04-26 LAB — CBC
HCT: 33.3 % — ABNORMAL LOW (ref 36.0–46.0)
Hemoglobin: 11.4 g/dL — ABNORMAL LOW (ref 12.0–15.0)
MCH: 28.7 pg (ref 26.0–34.0)
MCHC: 34.2 g/dL (ref 30.0–36.0)
MCV: 83.9 fL (ref 80.0–100.0)
Platelets: 220 10*3/uL (ref 150–400)
RBC: 3.97 MIL/uL (ref 3.87–5.11)
RDW: 18.3 % — ABNORMAL HIGH (ref 11.5–15.5)
WBC: 19.2 10*3/uL — ABNORMAL HIGH (ref 4.0–10.5)
nRBC: 0 % (ref 0.0–0.2)

## 2019-04-26 LAB — BASIC METABOLIC PANEL
Anion gap: 10 (ref 5–15)
BUN: 33 mg/dL — ABNORMAL HIGH (ref 8–23)
CO2: 14 mmol/L — ABNORMAL LOW (ref 22–32)
Calcium: 7.3 mg/dL — ABNORMAL LOW (ref 8.9–10.3)
Chloride: 109 mmol/L (ref 98–111)
Creatinine, Ser: 2.08 mg/dL — ABNORMAL HIGH (ref 0.44–1.00)
GFR calc Af Amer: 24 mL/min — ABNORMAL LOW (ref >60)
GFR calc non Af Amer: 21 mL/min — ABNORMAL LOW (ref >60)
Glucose, Bld: 84 mg/dL (ref 70–99)
Potassium: 4.3 mmol/L (ref 3.5–5.1)
Sodium: 133 mmol/L — ABNORMAL LOW (ref 135–145)

## 2019-04-26 LAB — SODIUM, URINE, RANDOM: Sodium, Ur: <10 mmol/L

## 2019-04-26 LAB — LACTIC ACID, PLASMA: Lactic Acid, Venous: 1.6 mmol/L (ref 0.5–1.9)

## 2019-04-26 LAB — CREATININE, URINE, RANDOM: Creatinine, Urine: 91.46 mg/dL

## 2019-04-26 LAB — CEA: CEA: 4.2 ng/mL (ref 0.0–4.7)

## 2019-04-26 LAB — PROTEIN, URINE, RANDOM: Total Protein, Urine: 100 mg/dL

## 2019-04-26 MED ORDER — LACTATED RINGERS IV SOLN
INTRAVENOUS | Status: DC
  Administered 2019-04-26: 18:00:00 via INTRAVENOUS

## 2019-04-26 MED ORDER — PIPERACILLIN-TAZOBACTAM 3.375 G IVPB
3.3750 g | Freq: Two times a day (BID) | INTRAVENOUS | Status: DC
  Administered 2019-04-26: 3.375 g via INTRAVENOUS
  Filled 2019-04-26 (×2): qty 50

## 2019-04-26 MED ORDER — SODIUM CHLORIDE 0.9 % IV SOLN
1.0000 g | Freq: Two times a day (BID) | INTRAVENOUS | Status: DC
  Filled 2019-04-26: qty 1

## 2019-04-26 NOTE — Progress Notes (Signed)
CT technician called to notified this RN that they cannot proceed to do CT with contract d/t  GFR and creatinine  abnormalities..  Notified Bodenheimer to see if he would like to order CT without contract. Awaiting for response

## 2019-04-26 NOTE — Progress Notes (Addendum)
PROGRESS NOTE    Asa LenteJoann A Murata  GNF:[id number] DOB: 1931/04/28 DOA: 04/25/2019 PCP: Patient, No Pcp Per   Brief Narrative:  Patient is a 85 year old female with no significant past medical history who presents with abdominal pain.  She started having left lower quadrant pain about 9 days ago, decreased p.o. intake.  She has not seen a doctor for many years.  It was reported that she was in pretty good health and is ambulatory, quite independent.  When she presented she was hypertensive.  She was found to have acute kidney injury, lactic acidosis, leukocytosis.  CT imaging done in the emergency department showed possible diverticulitis, abscess versus fluid in the abdomen.  Surgery consulted.  Assessment & Plan:   Principal Problem:   Diverticulitis of colon with perforation Active Problems:   Sepsis (HCC)   Abdominal pain: Presented with decreased oral intake, left lower quadrant abdominal pain. CT abdomen/pelvis with oral contrast done this morning showed potential fluid collection or abscess in the left hemipelvis,diffuse wall thickening of the sigmoid colon concerning  For diverticulitis or colitis.Small volume abdominal ascites. IR was consulted earlier for possible drainage of the abscess but IR states she is not a candidate for drainage. General surgery following. Continue broad-spectrum antibiotics with Zosyn.Keep her NPO. Will follow up,blood cultures.  Currently she is hemodynamically stable.  AKI : Creatinine was normal in 09/2018.Possibility of ATN.She also underwent CT with contrast yesterday.CT showed patchy persistent bilateral nephrograms concerning for acute tubular necrosis. Continue  IV fluids.  Will insert Foley catheter.  Will get urine sodium ,protein ,ultrasound of the kidneys and bladder.I have requested nephrology evaluation suspecting that her kidney function will go worse. She has minimal urine output today.She made 25 ml of dark colored  urine.  Debility/deconditioning: Patient reports that she is quite ambulatory, independent and works on her yard.  Imaging showed bilateral acute to subacute sacral insufficiency fractures . we will request for PT evaluation when appropriate.          DVT prophylaxis: SCD Code Status: Full Family Communication: called granddaughter on phone .discussed with husband on phone also [facility]   Consultants: Surgery  Procedures:None  Antimicrobials:  Anti-infectives (From admission, onward)   Start     Dose/Rate Route Frequency Ordered Stop   04/26/19 2200  piperacillin-tazobactam (ZOSYN) IVPB 3.375 g     3.375 g 12.5 mL/hr over 240 Minutes Intravenous Every 12 hours 04/26/19 1253     04/26/19 1800  aztreonam (AZACTAM) 1 g in sodium chloride 0.9 % 100 mL IVPB  Status:  Discontinued     1 g 200 mL/hr over 30 Minutes Intravenous Every 12 hours 04/26/19 0805 04/26/19 1253   04/25/19 2030  ciprofloxacin (CIPRO) IVPB 200 mg  Status:  Discontinued     200 mg 100 mL/hr over 60 Minutes Intravenous Every 12 hours 04/25/19 1109 04/25/19 1640   04/25/19 2030  aztreonam (AZACTAM) 1 g in sodium chloride 0.9 % 100 mL IVPB  Status:  Discontinued     1 g 200 mL/hr over 30 Minutes Intravenous Every 8 hours 04/25/19 1651 04/26/19 0805   04/25/19 1730  aztreonam (AZACTAM) 2 g in sodium chloride 0.9 % 100 mL IVPB  Status:  Discontinued     2 g 200 mL/hr over 30 Minutes Intravenous  Once 04/25/19 1640 04/25/19 1651   04/25/19 1600  metroNIDAZOLE (FLAGYL) IVPB 500 mg  Status:  Discontinued     500 mg 100 mL/hr over 60 Minutes Intravenous  Every 8 hours 04/25/19 1109 04/26/19 1253   04/25/19 0730  ciprofloxacin (CIPRO) IVPB 400 mg     400 mg 200 mL/hr over 60 Minutes Intravenous  Once 04/25/19 0729 04/25/19 0921   04/25/19 0730  metroNIDAZOLE (FLAGYL) IVPB 500 mg     500 mg 100 mL/hr over 60 Minutes Intravenous  Once 04/25/19 0729 04/25/19 0924       Subjective:  Patient seen and examined the bedside this morning.  Hemodynamically stable.  She was in severe abdominal pain.  She was drinking contrast for the upcoming CT abdomen.  No nausea or vomiting.  Abdomen was diffusely tender  Objective: Vitals:   04/26/19 0755 04/26/19 1039 04/26/19 1240 04/26/19 1300  BP:  119/70 92/62 110/62  Pulse:  (!) 114 (!) 108   Resp: Temp:  97.9 F (36.6 C) 97.9 F (36.6 C)   TempSrc:  Axillary Axillary   SpO2: 94% 94% 95%   Weight:      Height:        Intake/Output Summary (Last 24 hours) at 04/26/2019 1329 Last data filed at 04/26/2019 1610 Gross per 24 hour  Intake 2375.95 ml  Output --  Net 2375.95 ml   Filed Weights   04/25/19 0336 04/25/19 1438  Weight: 40.8 kg 42.9 kg    Examination:  General exam: Very pleasant elderly female in moderate distress due to abdominal pain  HEENT:PERRL,Oral mucosa moist, Ear/Nose normal on gross exam Respiratory system: Bilateral equal air entry, normal vesicular breath sounds, no wheezes or crackles  Cardiovascular system: S1 & S2 heard, RRR. No JVD, murmurs, rubs, gallops or clicks. No pedal edema. Gastrointestinal system: Abdomen is mildly distended, soft and very tender on the lower quadrants. No organomegaly or masses felt. Sluggish  bowel sounds  Central nervous system: Alert and oriented. No focal neurological deficits. Extremities: No edema, no clubbing ,no cyanosis, distal peripheral pulses palpable. Skin: No rashes, lesions or ulcers,no icterus ,no pallor Psychiatry: Judgement and insight appear normal. Mood & affect appropriate.    Data Reviewed: I have personally reviewed following labs and imaging studies  CBC: Recent Labs  Lab 04/25/19 0355 04/26/19 0324  WBC 7.8 19.2*  HGB 13.1 11.4*  HCT 40.0 33.3*  MCV 85.3 83.9  PLT 166 220   Basic Metabolic Panel: Recent Labs  Lab 04/25/19 0355 04/26/19 0324  NA 131* 133*  K 3.6 4.3  CL 98 109  CO2 17* 14*   GLUCOSE 99 84  BUN 21 33*  CREATININE 1.02* 2.08*  CALCIUM 8.9 7.3*   GFR: Estimated Creatinine Clearance: 13.1 mL/min (A) (by C-G formula based on SCr of 2.08 mg/dL (H)). Liver Function Tests: Recent Labs  Lab 04/25/19 0355  AST 46*  ALT 33  ALKPHOS 237*  BILITOT 1.7*  PROT 6.1*  ALBUMIN 2.4*   Recent Labs  Lab 04/25/19 0355  LIPASE 15   No results for input(s): AMMONIA in the last 168 hours. Coagulation Profile: No results for input(s): INR, PROTIME in the last 168 hours. Cardiac Enzymes: No results for input(s): CKTOTAL, CKMB, CKMBINDEX, TROPONINI in the last 168 hours. BNP (last 3 results) No results for input(s): PROBNP in the last 8760 hours. HbA1C: No results for input(s): HGBA1C in the last 72 hours. CBG: No results for input(s): GLUCAP in the last 168 hours. Lipid Profile: No results for input(s): CHOL, HDL, LDLCALC, TRIG, CHOLHDL, LDLDIRECT in the last 72 hours. Thyroid Function Tests: No results for input(s): TSH, T4TOTAL, FREET4, T3FREE,  THYROIDAB in the last 72 hours. Anemia Panel: No results for input(s): VITAMINB12, FOLATE, FERRITIN, TIBC, IRON, RETICCTPCT in the last 72 hours. Sepsis Labs: Recent Labs  Lab 04/25/19 1513 04/25/19 1700 04/25/19 2021 04/26/19 0810  PROCALCITON  --  147.34  --   --   LATICACIDVEN 3.2* 2.9* 1.6 1.6    Recent Results (from the past 240 hour(s))  SARS CORONAVIRUS 2 (TAT 6-24 HRS) Nasopharyngeal Nasopharyngeal Swab     Status: None   Collection Time: 04/25/19 10:57 AM   Specimen: Nasopharyngeal Swab  Result Value Ref Range Status   SARS Coronavirus 2 NEGATIVE NEGATIVE Final    Comment: (NOTE) SARS-CoV-2 target nucleic acids are NOT DETECTED. The SARS-CoV-2 RNA is generally detectable in upper and lower respiratory specimens during the acute phase of infection. Negative results do not preclude SARS-CoV-2 infection, do not rule out co-infections with other pathogens, and should not be used as the sole basis for  treatment or other patient management decisions. Negative results must be combined with clinical observations, patient history, and epidemiological information. The expected result is Negative. Fact Sheet for Patients: HairSlick.no Fact Sheet for Healthcare Providers: quierodirigir.com This test is not yet approved or cleared by the [place] FDA and  has been authorized for detection and/or diagnosis of SARS-CoV-2 by FDA under an Emergency Use Authorization (EUA). This EUA will remain  in effect (meaning this test can be used) for the duration of the COVID-19 declaration under Section 56 4(b)(1) of the Act, 21 U.S.C. section 360bbb-3(b)(1), unless the authorization is terminated or revoked sooner. Performed at [facility], [address], [place], [place] [address]   MRSA PCR Screening     Status: None   Collection Time: 04/25/19  2:48 PM   Specimen: Nasal Mucosa; Nasopharyngeal  Result Value Ref Range Status   MRSA by PCR NEGATIVE NEGATIVE Final    Comment:        The GeneXpert MRSA Assay (FDA approved for NASAL specimens only), is one component of a comprehensive MRSA colonization surveillance program. It is not intended to diagnose MRSA infection nor to guide or monitor treatment for MRSA infections. Performed at [facility], [address], [place], [place] [address]   Culture, blood (x 2)     Status: None (Preliminary result)   Collection Time: 04/25/19  5:05 PM   Specimen: BLOOD LEFT HAND  Result Value Ref Range Status   Specimen Description BLOOD LEFT HAND  Final   Special Requests   Final    AEROBIC BOTTLE ONLY Blood Culture results may not be optimal due to an inadequate volume of blood received in culture bottles   Culture   Final    NO GROWTH < 24 HOURS Performed at [facility], [address], [place], [place] [address]    Report Status PENDING  Incomplete  Culture, blood (x 2)      Status: None (Preliminary result)   Collection Time: 04/25/19  5:12 PM   Specimen: BLOOD RIGHT HAND  Result Value Ref Range Status   Specimen Description BLOOD RIGHT HAND  Final   Special Requests AEROBIC BOTTLE ONLY Blood Culture adequate volume  Final   Culture   Final    NO GROWTH < 24 HOURS Performed at [facility], [address], [place], [place] [address]    Report Status PENDING  Incomplete         Radiology Studies: Ct Abdomen Pelvis Wo Contrast  Result Date:  04/26/2019 CLINICAL DATA:  Abdominal distension. EXAM: CT ABDOMEN AND PELVIS WITHOUT CONTRAST TECHNIQUE: Multidetector CT imaging of the abdomen and pelvis was performed following the standard protocol without IV contrast. COMPARISON:  CT dated 04/25/2019. FINDINGS: Lower chest: There are small bilateral pleural effusions with adjacent atelectasis.The heart size is relatively normal. Aortic calcifications and coronary artery calcifications are noted. Hepatobiliary: The liver is normal. There is hyperdense material within the gallbladder lumen likely representing vicarious excretion of contrast from the prior contrast enhanced examination.There is no biliary ductal dilation. Pancreas: Normal contours without ductal dilatation. No peripancreatic fluid collection. Spleen: No splenic laceration or hematoma. Adrenals/Urinary Tract: --Adrenal glands: No adrenal hemorrhage. --Right kidney/ureter: There is a persistent patchy nephrogram without evidence for hydronephrosis. --Left kidney/ureter: There is a persistent patchy nephrogram without hydronephrosis. --Urinary bladder: Unremarkable. Stomach/Bowel: --Stomach/Duodenum: There is oral contrast in the stomach and esophagus. There is a small to moderate-sized hiatal hernia. --Small bowel: There are dilated loops of small bowel with air-fluid levels measuring up to approximately 4 cm. --Colon: There is scattered colonic diverticula. There appears to be diffuse wall thickening of  the sigmoid colon --Appendix: The appendix is not reliably identified. Vascular/Lymphatic: Atherosclerotic calcification is present within the non-aneurysmal abdominal aorta, without hemodynamically significant stenosis. There is likely moderate to severe stenosis of the left common iliac artery secondary to advanced atherosclerotic disease. --No retroperitoneal lymphadenopathy. --No mesenteric lymphadenopathy. --No pelvic or inguinal lymphadenopathy. Reproductive: Unremarkable Other: Again identified is a questionable fluid collection in the left hemipelvis with a overall very similar appearance when compared to prior study. Evaluation this collection is limited by both lack of IV and oral contrast. There is a small volume of abdominal ascites. There is fat stranding within the patient's presacral region. There is a trace amount of pelvic free fluid. Musculoskeletal. Again noted is posterior fusion hardware through the visualized lower lumbar spine. There is a subacute fracture involving the sacrum. There are bilateral sacral insufficiency fractures. IMPRESSION: 1. Study is again very limited secondary to extensive streak artifact from the patient's lumbar fusion hardware in addition to a lack of IV contrast. 2. Again noted is a potential fluid collection or abscess in the left hemipelvis, with a similar distribution in size when compared to prior study. This is not well evaluated in the absence of IV contrast. 3. Diffuse wall thickening of the sigmoid colon raises concern for diverticulitis or colitis. 4. Bilateral acute to subacute sacral insufficiency fractures are noted. 5. Small volume abdominal ascites. 6. Patchy persistent bilateral nephrograms concerning for acute tubular necrosis. Correlation with laboratory studies is recommended. 7. Small bilateral pleural effusions with adjacent atelectasis. Electronically Signed   By: Constance Holster M.D.   On: 04/26/2019 11:39   Ct Angio Abd/pel W And/or Wo  Contrast  Result Date: 04/25/2019 CLINICAL DATA:  Abdominal pain for 3 weeks. Evaluate for mesenteric ischemia versus diverticulitis. EXAM: CTA ABDOMEN AND PELVIS WITHOUT AND WITH CONTRAST TECHNIQUE: Multidetector CT imaging of the abdomen and pelvis was performed using the standard protocol during bolus administration of intravenous contrast. Multiplanar reconstructed images and MIPs were obtained and reviewed to evaluate the vascular anatomy. CONTRAST:  95mL OMNIPAQUE IOHEXOL 350 MG/ML SOLN COMPARISON:  None. FINDINGS: VASCULAR Aorta: Portions of the abdominal aorta are obscured by beam hardening artifact from the patient's extensive lumbosacral fusion hardware. Within this limitation, advanced atherosclerotic disease is seen in the abdominal aorta without aneurysm or occlusion. Celiac: Patent without evidence for flow limiting stenosis. SMA: Widely patent. Renals: Dense calcific plaque at  the origin of the left renal artery with potential flow limiting stenosis. Dense calcific plaque also noted at the origin of the single right renal artery with ostial narrowing, potentially flow limiting IMA: Patent. Inflow: Atherosclerotic but patent. Proximal Outflow: Patent. Veins: Unopacified Review of the MIP images confirms the above findings. NON-VASCULAR Lower chest: Collapse/consolidation noted posterior right lower lobe. Hepatobiliary: No suspicious focal abnormality within the liver parenchyma. There is no evidence for gallstones, gallbladder wall thickening, or pericholecystic fluid. No intrahepatic or extrahepatic biliary dilation. Pancreas: Mild diffuse distention of the main pancreatic duct. No obstructing mass lesion evident in the head of pancreas. Spleen: No splenomegaly. No focal mass lesion. Adrenals/Urinary Tract: No adrenal nodule or mass. Kidneys unremarkable with inferior left kidney obscured by artifact. Stomach/Bowel: Small hiatal hernia. Stomach is fluid-filled and distended. Duodenum is distended  proximally. Duodenal C-loop cannot be seen to cross the midline suggesting malrotation. Evaluation of the lower abdomen and upper pelvis is markedly limited due to the artifact. Within this limitation, there is diffusely abnormal small bowel, dilated up to 3.2 cm diameter in the upper abdomen. Colonic loops are not clearly demonstrated in the right abdomen although the descending colon and sigmoid colon are visualized. There is some apparent wall thickening in the sigmoid colon. There is diffuse fluid in the abdomen with mesenteric and omental edema. Diffuse peritoneal enhancement is associated. There is an 11.4 x 2.4 x 2.1 cm collection of fluid and gas in the left pelvis that appears extraluminal with rim enhancement (see axial 56/12 and coronal 61/15). Rim enhancing fluid collection noted in the right groin extending into a small right groin hernia. This is probably extraluminal as well although could potentially be small-bowel. Bowel herniation into a right groin hernia is evident on axial 64/12. Rim enhancing fluid is identified anteriorly in the left abdomen but largely obscured by artifact. Lymphatic: No definite abdominal lymphadenopathy although large portions of the retroperitoneum are obscured. No pelvic sidewall lymphadenopathy Reproductive: Unremarkable. Other: None. Musculoskeletal: Extensive lumbosacral fusion hardware posteriorly. IMPRESSION: VASCULAR Atherosclerotic disease without evidence for aortic occlusion. Mesenteric arterial anatomy is patent. NON-VASCULAR Extensive fluid throughout the abdomen with associated peritoneal enhancement. Assessment is dramatically limited due to the substantial beam hardening artifact arising from the extensive lumbosacral fusion hardware. Within this marked limitation there appears to be a relatively large rim enhancing collection of fluid and gas in the left pelvis suspicious for an abscess. If this is an abscess, the remaining complex mesenteric fluid and  peritoneal enhancement is concerning for diffuse peritonitis. Peritoneal carcinomatosis would be a consideration except the extraluminal collection of gas and fluid in the left pelvis with not be consistent with that diagnosis. Etiology for the apparent pelvic abscess and peritoneal disease is not discernible on this degraded study although the patient is noted to have a small right groin hernia containing a bowel loop and small bowel or fluid in a left groin hernia. Diverticular disease is noted in the left colon and perforated diverticulitis would be a consideration. An area of apparent circumferential wall thickening in the sigmoid colon could be diverticular disease or related to neoplasm. Duodenum cannot be seen to cross the midline suggesting underlying malrotation. I discussed these findings with Dr. Eudelia Bunch in the emergency department at approximately 0730 hours on 04/25/2019 Electronically Signed   By: Kennith Center M.D.   On: 04/25/2019 07:02        Scheduled Meds:  enoxaparin (LOVENOX) injection  30 mg Subcutaneous Q24H   morphine   Intravenous  Q4H   Continuous Infusions:  sodium chloride 125 mL/hr at 04/26/19 1007   piperacillin-tazobactam (ZOSYN)  IV       LOS: 1 day    Time spent:35 mins. More than 50% of that time was spent in counseling and/or coordination of care.      Burnadette Pop, MD [facility] Pager [phone]  If 7PM-7AM, please contact night-coverage [email or web address] Password [email or web address] 04/26/2019, 1:29 PM

## 2019-04-26 NOTE — Progress Notes (Signed)
[facility] Progress Note     Subjective: CC-  Continues to have diffuse abdominal pain, worse in the lower abdomen. States that pain is less than it was yesterday. She denies n/v. No flatus or BM. WBC up 19.2, afebrile. Tachycardic but BP stable. Creatinine up 2.08 and she has had minimal UOP.   Objective: Vital signs in last 24 hours: Temp:  [97.5 F (36.4 C)-97.6 F (36.4 C)] 97.6 F (36.4 C) (09/27 2039) Pulse Rate:  [91-114] 114 (09/28 0639) Resp:  [11-30] 11 (09/28 0755) BP: (84-137)/(53-78) 137/68 (09/28 0639) SpO2:  [90 %-99 %] 94 % (09/28 0755) Weight:  [42.9 kg] 42.9 kg (09/27 1438) Last BM Date: (PTA)  Intake/Output from previous day: 09/27 0701 - 09/28 0700 In: 3562.4 [I.V.:1526.4; IV Piggyback:2036] Out: -  Intake/Output this shift: Total I/O In: 100 [IV Piggyback:100] Out: -   PE: Gen:  Alert, NAD, pleasant HEENT: EOM's intact, pupils equal and round Card: tachy Pulm:  Rate and effort normal Abd: well healed midline and RLQ incisions, mostly soft, mild distension, diffusely tender but mostly in lower abdominal quadrants L>R, no peritonitis Psych: A&Ox3  Skin: no rashes noted, warm and dry  Lab Results:  Recent Labs    04/25/19 0355 04/26/19 0324  WBC 7.8 19.2*  HGB 13.1 11.4*  HCT 40.0 33.3*  PLT 166 220   BMET Recent Labs    04/25/19 0355 04/26/19 0324  NA 131* 133*  K 3.6 4.3  CL 98 109  CO2 17* 14*  GLUCOSE 99 84  BUN 21 33*  CREATININE 1.02* 2.08*  CALCIUM 8.9 7.3*   PT/INR No results for input(s): LABPROT, INR in the last 72 hours. CMP     Component Value Date/Time   NA 133 (L) 04/26/2019 0324   K 4.3 04/26/2019 0324   CL 109 04/26/2019 0324   CO2 14 (L) 04/26/2019 0324   GLUCOSE 84 04/26/2019 0324   BUN 33 (H) 04/26/2019 0324   CREATININE 2.08 (H) 04/26/2019 0324   CALCIUM 7.3 (L) 04/26/2019 0324   PROT 6.1 (L) 04/25/2019 0355   ALBUMIN 2.4 (L) 04/25/2019 0355   AST 46 (H) 04/25/2019 0355   ALT 33  04/25/2019 0355   ALKPHOS 237 (H) 04/25/2019 0355   BILITOT 1.7 (H) 04/25/2019 0355   GFRNONAA 21 (L) 04/26/2019 0324   GFRAA 24 (L) 04/26/2019 0324   Lipase     Component Value Date/Time   LIPASE 15 04/25/2019 0355       Studies/Results: Ct Angio Abd/pel W And/or Wo Contrast  Result Date: 04/25/2019 CLINICAL DATA:  Abdominal pain for 3 weeks. Evaluate for mesenteric ischemia versus diverticulitis. EXAM: CTA ABDOMEN AND PELVIS WITHOUT AND WITH CONTRAST TECHNIQUE: Multidetector CT imaging of the abdomen and pelvis was performed using the standard protocol during bolus administration of intravenous contrast. Multiplanar reconstructed images and MIPs were obtained and reviewed to evaluate the vascular anatomy. CONTRAST:  81mL OMNIPAQUE IOHEXOL 350 MG/ML SOLN COMPARISON:  None. FINDINGS: VASCULAR Aorta: Portions of the abdominal aorta are obscured by beam hardening artifact from the patient's extensive lumbosacral fusion hardware. Within this limitation, advanced atherosclerotic disease is seen in the abdominal aorta without aneurysm or occlusion. Celiac: Patent without evidence for flow limiting stenosis. SMA: Widely patent. Renals: Dense calcific plaque at the origin of the left renal artery with potential flow limiting stenosis. Dense calcific plaque also noted at the origin of the single right renal artery with ostial narrowing, potentially flow limiting IMA: Patent. Inflow: Atherosclerotic but  patent. Proximal Outflow: Patent. Veins: Unopacified Review of the MIP images confirms the above findings. NON-VASCULAR Lower chest: Collapse/consolidation noted posterior right lower lobe. Hepatobiliary: No suspicious focal abnormality within the liver parenchyma. There is no evidence for gallstones, gallbladder wall thickening, or pericholecystic fluid. No intrahepatic or extrahepatic biliary dilation. Pancreas: Mild diffuse distention of the main pancreatic duct. No obstructing mass lesion evident in the  head of pancreas. Spleen: No splenomegaly. No focal mass lesion. Adrenals/Urinary Tract: No adrenal nodule or mass. Kidneys unremarkable with inferior left kidney obscured by artifact. Stomach/Bowel: Small hiatal hernia. Stomach is fluid-filled and distended. Duodenum is distended proximally. Duodenal C-loop cannot be seen to cross the midline suggesting malrotation. Evaluation of the lower abdomen and upper pelvis is markedly limited due to the artifact. Within this limitation, there is diffusely abnormal small bowel, dilated up to 3.2 cm diameter in the upper abdomen. Colonic loops are not clearly demonstrated in the right abdomen although the descending colon and sigmoid colon are visualized. There is some apparent wall thickening in the sigmoid colon. There is diffuse fluid in the abdomen with mesenteric and omental edema. Diffuse peritoneal enhancement is associated. There is an 11.4 x 2.4 x 2.1 cm collection of fluid and gas in the left pelvis that appears extraluminal with rim enhancement (see axial 56/12 and coronal 61/15). Rim enhancing fluid collection noted in the right groin extending into a small right groin hernia. This is probably extraluminal as well although could potentially be small-bowel. Bowel herniation into a right groin hernia is evident on axial 64/12. Rim enhancing fluid is identified anteriorly in the left abdomen but largely obscured by artifact. Lymphatic: No definite abdominal lymphadenopathy although large portions of the retroperitoneum are obscured. No pelvic sidewall lymphadenopathy Reproductive: Unremarkable. Other: None. Musculoskeletal: Extensive lumbosacral fusion hardware posteriorly. IMPRESSION: VASCULAR Atherosclerotic disease without evidence for aortic occlusion. Mesenteric arterial anatomy is patent. NON-VASCULAR Extensive fluid throughout the abdomen with associated peritoneal enhancement. Assessment is dramatically limited due to the substantial beam hardening artifact  arising from the extensive lumbosacral fusion hardware. Within this marked limitation there appears to be a relatively large rim enhancing collection of fluid and gas in the left pelvis suspicious for an abscess. If this is an abscess, the remaining complex mesenteric fluid and peritoneal enhancement is concerning for diffuse peritonitis. Peritoneal carcinomatosis would be a consideration except the extraluminal collection of gas and fluid in the left pelvis with not be consistent with that diagnosis. Etiology for the apparent pelvic abscess and peritoneal disease is not discernible on this degraded study although the patient is noted to have a small right groin hernia containing a bowel loop and small bowel or fluid in a left groin hernia. Diverticular disease is noted in the left colon and perforated diverticulitis would be a consideration. An area of apparent circumferential wall thickening in the sigmoid colon could be diverticular disease or related to neoplasm. Duodenum cannot be seen to cross the midline suggesting underlying malrotation. I discussed these findings with Dr. Eudelia Bunchardama in the emergency department at approximately 0730 hours on 04/25/2019 Electronically Signed   By: Kennith CenterEric  Mansell M.D.   On: 04/25/2019 07:02    Anti-infectives: Anti-infectives (From admission, onward)   Start     Dose/Rate Route Frequency Ordered Stop   04/26/19 1800  aztreonam (AZACTAM) 1 g in sodium chloride 0.9 % 100 mL IVPB     1 g 200 mL/hr over 30 Minutes Intravenous Every 12 hours 04/26/19 0805     04/25/19 2030  ciprofloxacin (CIPRO) IVPB 200 mg  Status:  Discontinued     200 mg 100 mL/hr over 60 Minutes Intravenous Every 12 hours 04/25/19 1109 04/25/19 1640   04/25/19 2030  aztreonam (AZACTAM) 1 g in sodium chloride 0.9 % 100 mL IVPB  Status:  Discontinued     1 g 200 mL/hr over 30 Minutes Intravenous Every 8 hours 04/25/19 1651 04/26/19 0805   04/25/19 1730  aztreonam (AZACTAM) 2 g in sodium chloride 0.9 %  100 mL IVPB  Status:  Discontinued     2 g 200 mL/hr over 30 Minutes Intravenous  Once 04/25/19 1640 04/25/19 1651   04/25/19 1600  metroNIDAZOLE (FLAGYL) IVPB 500 mg     500 mg 100 mL/hr over 60 Minutes Intravenous Every 8 hours 04/25/19 1109     04/25/19 0730  ciprofloxacin (CIPRO) IVPB 400 mg     400 mg 200 mL/hr over 60 Minutes Intravenous  Once 04/25/19 0729 04/25/19 0921   04/25/19 0730  metroNIDAZOLE (FLAGYL) IVPB 500 mg     500 mg 100 mL/hr over 60 Minutes Intravenous  Once 04/25/19 0729 04/25/19 0924       Assessment/Plan Constipation Tobacco abuse AKI - worsening, Cr 2.08 and she has minimal UOP. Increase IVF, per primary  Pelvic fluid collection - 1 week of worsening lower abdominal pain, 1 prior episode in early summer that resolved without intervention - she has never had a colonoscopy - CTA 9/27 showed 11.4 x 2.4 x 2.1 cm collection of fluid and gas in the left pelvis that appears extraluminal with rim enhancement, etiology difficult to discern but could be diverticular disease or possible colonic neoplasm - IR states there is currently nothing to drain. ?possible ascites in pelvis. Recommend repeat CT scan with oral and IV contrast - CEA 4.2  ID - cipro 9/27 x1, flagyl 9/27>> , aztreonam 9/27>> VTE - SCDs, lovenox FEN - IVF, NPO Foley - wick Follow up - TBD  Plan: Repeat CT scan today, will do oral contrast but no IV contrast due to AKI. Continue broad spectrum antibiotics, bowel rest.    LOS: 1 day    Franne Forts , [facility] 04/26/2019, 8:53 AM Pager: [phone] Mon-Thurs 7:00 am-4:30 pm Fri 7:00 am -11:30 AM Sat-Sun 7:00 am-11:30 am

## 2019-04-26 NOTE — Consult Note (Signed)
Reason for Consult: Acute kidney injury, anion gap metabolic acidosis Referring Physician: [facility]  HPI:  85 year old Caucasian woman with past medical history significant for appendectomy, abdominal hysterectomy and knee surgery and no regular medical follow-up over the past several years who has had progressive worsening of abdominal pain over the past 3 weeks or so but more prominently over the past week to a point that she presented to the emergency room after earlier refusal.  Work-up in the emergency room showed fluid collection with gas in the left hemipelvis and a focal area of colonic wall thickening.  She was started on antibiotic therapy for presumptive diagnosis of pelvic abscess and surgery consulted.  On presentation, it was noted that her creatinine was 1.0 up from a baseline of around 0.5 six months ago.  She received iodinated intravenous contrast for CT angiogram of the abdomen and pelvis and today creatinine has risen to 2.1 with oliguria overnight.  Per her husband, she was urinating frequently prior to Sunday and drinking liberal amounts of fluid however, urine appeared darker in color and possibly contain some blood.  She denies any prior history of acute kidney injury, recurrent nephrolithiasis or recurrent urinary tract infections.  She denies any NSAID use.  She is a current smoker.  History reviewed. No pertinent past medical history.  Past Surgical History:  Procedure Laterality Date  . ABDOMINAL HYSTERECTOMY    . APPENDECTOMY    . KNEE SURGERY      History reviewed. No pertinent family history.  Social History:  reports that she has been smoking. She has never used smokeless tobacco. She reports that she does not drink alcohol or use drugs.  Allergies:  Allergies  Allergen Reactions  . Codeine Nausea And Vomiting    Medications:  Scheduled: . morphine   Intravenous Q4H    BMP Latest Ref Rng & Units 04/26/2019 04/25/2019 10/27/2018  Glucose 70  - 99 mg/dL 84 99 098(J)  BUN 8 - 23 mg/dL 19(J) 21 13  Creatinine 0.44 - 1.00 mg/dL 4.78(G) 9.56(O) 1.30  Sodium 135 - 145 mmol/L 133(L) 131(L) 122(L)  Potassium 3.5 - 5.1 mmol/L 4.3 3.6 3.8  Chloride 98 - 111 mmol/L 109 98 89(L)  CO2 22 - 32 mmol/L 14(L) 17(L) 22  Calcium 8.9 - 10.3 mg/dL 7.3(L) 8.9 8.6(L)   CBC Latest Ref Rng & Units 04/26/2019 04/25/2019 10/27/2018  WBC 4.0 - 10.5 K/uL 19.2(H) 7.8 16.4(H)  Hemoglobin 12.0 - 15.0 g/dL 11.4(L) 13.1 12.5  Hematocrit 36.0 - 46.0 % 33.3(L) 40.0 37.2  Platelets 150 - 400 K/uL 220 166 160     Ct Abdomen Pelvis Wo Contrast  Result Date: 04/26/2019 CLINICAL DATA:  Abdominal distension. EXAM: CT ABDOMEN AND PELVIS WITHOUT CONTRAST TECHNIQUE: Multidetector CT imaging of the abdomen and pelvis was performed following the standard protocol without IV contrast. COMPARISON:  CT dated 04/25/2019. FINDINGS: Lower chest: There are small bilateral pleural effusions with adjacent atelectasis.The heart size is relatively normal. Aortic calcifications and coronary artery calcifications are noted. Hepatobiliary: The liver is normal. There is hyperdense material within the gallbladder lumen likely representing vicarious excretion of contrast from the prior contrast enhanced examination.There is no biliary ductal dilation. Pancreas: Normal contours without ductal dilatation. No peripancreatic fluid collection. Spleen: No splenic laceration or hematoma. Adrenals/Urinary Tract: --Adrenal glands: No adrenal hemorrhage. --Right kidney/ureter: There is a persistent patchy nephrogram without evidence for hydronephrosis. --Left kidney/ureter: There is a persistent patchy nephrogram without hydronephrosis. --Urinary bladder: Unremarkable. Stomach/Bowel: --Stomach/Duodenum: There  is oral contrast in the stomach and esophagus. There is a small to moderate-sized hiatal hernia. --Small bowel: There are dilated loops of small bowel with air-fluid levels measuring up to approximately 4  cm. --Colon: There is scattered colonic diverticula. There appears to be diffuse wall thickening of the sigmoid colon --Appendix: The appendix is not reliably identified. Vascular/Lymphatic: Atherosclerotic calcification is present within the non-aneurysmal abdominal aorta, without hemodynamically significant stenosis. There is likely moderate to severe stenosis of the left common iliac artery secondary to advanced atherosclerotic disease. --No retroperitoneal lymphadenopathy. --No mesenteric lymphadenopathy. --No pelvic or inguinal lymphadenopathy. Reproductive: Unremarkable Other: Again identified is a questionable fluid collection in the left hemipelvis with a overall very similar appearance when compared to prior study. Evaluation this collection is limited by both lack of IV and oral contrast. There is a small volume of abdominal ascites. There is fat stranding within the patient's presacral region. There is a trace amount of pelvic free fluid. Musculoskeletal. Again noted is posterior fusion hardware through the visualized lower lumbar spine. There is a subacute fracture involving the sacrum. There are bilateral sacral insufficiency fractures. IMPRESSION: 1. Study is again very limited secondary to extensive streak artifact from the patient's lumbar fusion hardware in addition to a lack of IV contrast. 2. Again noted is a potential fluid collection or abscess in the left hemipelvis, with a similar distribution in size when compared to prior study. This is not well evaluated in the absence of IV contrast. 3. Diffuse wall thickening of the sigmoid colon raises concern for diverticulitis or colitis. 4. Bilateral acute to subacute sacral insufficiency fractures are noted. 5. Small volume abdominal ascites. 6. Patchy persistent bilateral nephrograms concerning for acute tubular necrosis. Correlation with laboratory studies is recommended. 7. Small bilateral pleural effusions with adjacent atelectasis.  Electronically Signed   By: Katherine Mantlehristopher  Green M.D.   On: 04/26/2019 11:39   Ct Angio Abd/pel W And/or Wo Contrast  Result Date: 04/25/2019 CLINICAL DATA:  Abdominal pain for 3 weeks. Evaluate for mesenteric ischemia versus diverticulitis. EXAM: CTA ABDOMEN AND PELVIS WITHOUT AND WITH CONTRAST TECHNIQUE: Multidetector CT imaging of the abdomen and pelvis was performed using the standard protocol during bolus administration of intravenous contrast. Multiplanar reconstructed images and MIPs were obtained and reviewed to evaluate the vascular anatomy. CONTRAST:  80mL OMNIPAQUE IOHEXOL 350 MG/ML SOLN COMPARISON:  None. FINDINGS: VASCULAR Aorta: Portions of the abdominal aorta are obscured by beam hardening artifact from the patient's extensive lumbosacral fusion hardware. Within this limitation, advanced atherosclerotic disease is seen in the abdominal aorta without aneurysm or occlusion. Celiac: Patent without evidence for flow limiting stenosis. SMA: Widely patent. Renals: Dense calcific plaque at the origin of the left renal artery with potential flow limiting stenosis. Dense calcific plaque also noted at the origin of the single right renal artery with ostial narrowing, potentially flow limiting IMA: Patent. Inflow: Atherosclerotic but patent. Proximal Outflow: Patent. Veins: Unopacified Review of the MIP images confirms the above findings. NON-VASCULAR Lower chest: Collapse/consolidation noted posterior right lower lobe. Hepatobiliary: No suspicious focal abnormality within the liver parenchyma. There is no evidence for gallstones, gallbladder wall thickening, or pericholecystic fluid. No intrahepatic or extrahepatic biliary dilation. Pancreas: Mild diffuse distention of the main pancreatic duct. No obstructing mass lesion evident in the head of pancreas. Spleen: No splenomegaly. No focal mass lesion. Adrenals/Urinary Tract: No adrenal nodule or mass. Kidneys unremarkable with inferior left kidney obscured by  artifact. Stomach/Bowel: Small hiatal hernia. Stomach is fluid-filled and distended. Duodenum is  distended proximally. Duodenal C-loop cannot be seen to cross the midline suggesting malrotation. Evaluation of the lower abdomen and upper pelvis is markedly limited due to the artifact. Within this limitation, there is diffusely abnormal small bowel, dilated up to 3.2 cm diameter in the upper abdomen. Colonic loops are not clearly demonstrated in the right abdomen although the descending colon and sigmoid colon are visualized. There is some apparent wall thickening in the sigmoid colon. There is diffuse fluid in the abdomen with mesenteric and omental edema. Diffuse peritoneal enhancement is associated. There is an 11.4 x 2.4 x 2.1 cm collection of fluid and gas in the left pelvis that appears extraluminal with rim enhancement (see axial 56/12 and coronal 61/15). Rim enhancing fluid collection noted in the right groin extending into a small right groin hernia. This is probably extraluminal as well although could potentially be small-bowel. Bowel herniation into a right groin hernia is evident on axial 64/12. Rim enhancing fluid is identified anteriorly in the left abdomen but largely obscured by artifact. Lymphatic: No definite abdominal lymphadenopathy although large portions of the retroperitoneum are obscured. No pelvic sidewall lymphadenopathy Reproductive: Unremarkable. Other: None. Musculoskeletal: Extensive lumbosacral fusion hardware posteriorly. IMPRESSION: VASCULAR Atherosclerotic disease without evidence for aortic occlusion. Mesenteric arterial anatomy is patent. NON-VASCULAR Extensive fluid throughout the abdomen with associated peritoneal enhancement. Assessment is dramatically limited due to the substantial beam hardening artifact arising from the extensive lumbosacral fusion hardware. Within this marked limitation there appears to be a relatively large rim enhancing collection of fluid and gas in the  left pelvis suspicious for an abscess. If this is an abscess, the remaining complex mesenteric fluid and peritoneal enhancement is concerning for diffuse peritonitis. Peritoneal carcinomatosis would be a consideration except the extraluminal collection of gas and fluid in the left pelvis with not be consistent with that diagnosis. Etiology for the apparent pelvic abscess and peritoneal disease is not discernible on this degraded study although the patient is noted to have a small right groin hernia containing a bowel loop and small bowel or fluid in a left groin hernia. Diverticular disease is noted in the left colon and perforated diverticulitis would be a consideration. An area of apparent circumferential wall thickening in the sigmoid colon could be diverticular disease or related to neoplasm. Duodenum cannot be seen to cross the midline suggesting underlying malrotation. I discussed these findings with Dr. Leonette Monarch in the emergency department at approximately 0730 hours on 04/25/2019 Electronically Signed   By: Misty Stanley M.D.   On: 04/25/2019 07:02    Review of Systems  Constitutional: Positive for chills and malaise/fatigue.  HENT: Negative.   Eyes: Negative.   Respiratory: Negative.   Cardiovascular: Negative.   Gastrointestinal: Positive for abdominal pain. Negative for blood in stool, constipation, diarrhea, heartburn and vomiting.  Genitourinary: Positive for hematuria. Negative for dysuria, frequency and urgency.  Musculoskeletal: Negative.   Skin: Negative.   Neurological: Negative.    Blood pressure 105/62, pulse (!) 106, temperature 98.3 F (36.8 C), temperature source Oral, resp. rate 13, height 5\' 2"  (1.575 m), weight 42.9 kg, SpO2 95 %. Physical Exam  Nursing note and vitals reviewed. Constitutional: She is oriented to person, place, and time. She appears well-developed and well-nourished.  Petite woman, appears uncomfortable  HENT:  Head: Normocephalic and atraumatic.  Dry  oral mucosa/oropharynx  Eyes: Pupils are equal, round, and reactive to light. Conjunctivae and EOM are normal. No scleral icterus.  Neck: Normal range of motion. Neck supple. No JVD present.  Cardiovascular: Normal heart sounds. Exam reveals no friction rub.  No murmur heard. Regular tachycardia  Respiratory: Effort normal and breath sounds normal. She has no wheezes. She has no rales.  GI: Bowel sounds are normal. There is abdominal tenderness. There is guarding.  Bilateral lower quadrants  Musculoskeletal:        General: No edema.  Neurological: She is alert and oriented to person, place, and time.  Skin: Skin is warm and dry. No rash noted.  Psychiatric: She has a normal mood and affect. Her behavior is normal.    Assessment/Plan: 1.  Acute kidney injury: This appears to be bi-factorial-on presentation likely AKI from sepsis/ATN and exacerbated by contrast exposure with an additional doubling of her creatinine overnight.  Unfortunately, she is oliguric and although net +4 L, appears to be euvolemic to possibly hypovolemic (likely from third spacing).  I will switch her to a balanced crystalloid for management of her associated metabolic acidosis as well.  I agree with checking urine electrolytes.  Serial imaging of her abdomen yesterday and today with CT scan did not show any developing hydronephrosis and initial CT angiogram suggests pattern of ATN.  She does not have any acute indications for renal replacement therapy at this time. 2.  Anion gap metabolic acidosis: This was seen originally during presentation and then improved with volume resuscitation with labs today showing non-anion gap metabolic acidosis.  Will switch to a balanced crystalloid and continue supportive management of acute kidney injury. 3.  Diverticulitis plus/minus pelvic abscess versus ascites: Ongoing evaluation by surgery and likely to undergo flexible sigmoidoscopy when stable to assess for possible malignancy.   Abdominal pain management per primary service. 4.  Hyponatremia: Secondary to acute kidney injury and possibly poor solute intake-unclear if she has baseline hyponatremia and her sodium level back in March was 122. 5.  Debility/deconditioning  Norma Ignasiak K. 04/26/2019, 4:22 PM

## 2019-04-27 ENCOUNTER — Inpatient Hospital Stay (HOSPITAL_COMMUNITY): Payer: Medicare HMO

## 2019-04-27 LAB — CBC WITH DIFFERENTIAL/PLATELET
Abs Immature Granulocytes: 0.97 10*3/uL — ABNORMAL HIGH (ref 0.00–0.07)
Basophils Absolute: 0.1 10*3/uL (ref 0.0–0.1)
Basophils Relative: 1 %
Eosinophils Absolute: 0 10*3/uL (ref 0.0–0.5)
Eosinophils Relative: 0 %
HCT: 31.6 % — ABNORMAL LOW (ref 36.0–46.0)
Hemoglobin: 10.4 g/dL — ABNORMAL LOW (ref 12.0–15.0)
Immature Granulocytes: 5 %
Lymphocytes Relative: 3 %
Lymphs Abs: 0.6 10*3/uL — ABNORMAL LOW (ref 0.7–4.0)
MCH: 27.8 pg (ref 26.0–34.0)
MCHC: 32.9 g/dL (ref 30.0–36.0)
MCV: 84.5 fL (ref 80.0–100.0)
Monocytes Absolute: 0.4 10*3/uL (ref 0.1–1.0)
Monocytes Relative: 2 %
Neutro Abs: 18.5 10*3/uL — ABNORMAL HIGH (ref 1.7–7.7)
Neutrophils Relative %: 89 %
Platelets: 144 10*3/uL — ABNORMAL LOW (ref 150–400)
RBC: 3.74 MIL/uL — ABNORMAL LOW (ref 3.87–5.11)
RDW: 19.3 % — ABNORMAL HIGH (ref 11.5–15.5)
WBC: 20.6 10*3/uL — ABNORMAL HIGH (ref 4.0–10.5)
nRBC: 0.1 % (ref 0.0–0.2)

## 2019-04-27 LAB — GLUCOSE, CAPILLARY: Glucose-Capillary: 60 mg/dL — ABNORMAL LOW (ref 70–99)

## 2019-04-27 LAB — RENAL FUNCTION PANEL
Albumin: 1.6 g/dL — ABNORMAL LOW (ref 3.5–5.0)
Anion gap: 16 — ABNORMAL HIGH (ref 5–15)
BUN: 44 mg/dL — ABNORMAL HIGH (ref 8–23)
CO2: 13 mmol/L — ABNORMAL LOW (ref 22–32)
Calcium: 7.8 mg/dL — ABNORMAL LOW (ref 8.9–10.3)
Chloride: 106 mmol/L (ref 98–111)
Creatinine, Ser: 2.52 mg/dL — ABNORMAL HIGH (ref 0.44–1.00)
GFR calc Af Amer: 19 mL/min — ABNORMAL LOW (ref >60)
GFR calc non Af Amer: 17 mL/min — ABNORMAL LOW (ref >60)
Glucose, Bld: 58 mg/dL — ABNORMAL LOW (ref 70–99)
Phosphorus: 5.4 mg/dL — ABNORMAL HIGH (ref 2.5–4.6)
Potassium: 4.4 mmol/L (ref 3.5–5.1)
Sodium: 135 mmol/L (ref 135–145)

## 2019-04-27 LAB — MAGNESIUM: Magnesium: 1.7 mg/dL (ref 1.7–2.4)

## 2019-04-27 MED ORDER — METRONIDAZOLE IN NACL 5-0.79 MG/ML-% IV SOLN
500.0000 mg | Freq: Three times a day (TID) | INTRAVENOUS | Status: DC
  Administered 2019-04-27 – 2019-04-29 (×7): 500 mg via INTRAVENOUS
  Filled 2019-04-27 (×7): qty 100

## 2019-04-27 MED ORDER — SODIUM CHLORIDE 0.9 % IV SOLN
2.0000 g | INTRAVENOUS | Status: DC
  Administered 2019-04-27 – 2019-04-29 (×3): 2 g via INTRAVENOUS
  Filled 2019-04-27 (×3): qty 20

## 2019-04-27 MED ORDER — DEXTROSE 50 % IV SOLN
1.0000 | INTRAVENOUS | Status: DC | PRN
  Administered 2019-04-27: 50 mL via INTRAVENOUS
  Filled 2019-04-27: qty 50

## 2019-04-27 MED ORDER — STERILE WATER FOR INJECTION IV SOLN
INTRAVENOUS | Status: AC
  Administered 2019-04-27: 12:00:00 via INTRAVENOUS
  Filled 2019-04-27: qty 850

## 2019-04-27 MED ORDER — IPRATROPIUM-ALBUTEROL 0.5-2.5 (3) MG/3ML IN SOLN
3.0000 mL | Freq: Four times a day (QID) | RESPIRATORY_TRACT | Status: DC
  Administered 2019-04-27 – 2019-04-28 (×6): 3 mL via RESPIRATORY_TRACT
  Filled 2019-04-27 (×5): qty 3

## 2019-04-27 NOTE — Progress Notes (Signed)
Patient has low grade temp of 100.6. Dr. Tawanna Solo paged to notify.   Hiram Comber, RN 04/27/2019 6:17 PM

## 2019-04-27 NOTE — Progress Notes (Signed)
CBG 60  Dr paged Dextrose 50 ordered

## 2019-04-27 NOTE — Progress Notes (Addendum)
Patient increasingly more lethargic throughout afternoon. Patient hardly leaves eyes open during conversation. Has not said a word to husband who is at bedside. Dr. Tawanna Solo paged and verbal order received to stop morphine pca pump. Will continue to monitor.  Hiram Comber, RN 04/27/2019 5:06 PM

## 2019-04-27 NOTE — Progress Notes (Signed)
   04/27/19 1815  Vitals  Temp (!) 100.6 F (38.1 C)  Temp Source Oral  ECG Heart Rate (!) 102  Resp 10  MEWS Score  MEWS RR 1  MEWS Pulse 1  MEWS Systolic 0  MEWS LOC 0  MEWS Temp 1  MEWS Score 3  MEWS Score Color Yellow  MEWS Assessment  Is this an acute change? Yes  Provider Notification  Provider Name/Title Shelly Coss MD  Date Provider Notified 04/27/19  Time Provider Notified 1816  Notification Type Page  Notification Reason Change in status

## 2019-04-27 NOTE — Progress Notes (Signed)
PCA discontinued per md orders. 62mLs of morphine wasted in stericycle with Alphonsus Sias, RN.

## 2019-04-27 NOTE — Progress Notes (Signed)
Upon morning assessment, patient has expiratory wheezes and increased WOB; increased oxygen from 2L to 4L. Dr. Tawanna Solo paged and at bedside to assess patient.

## 2019-04-27 NOTE — Progress Notes (Signed)
Patient no longer has wheezing upon auscultation however continues to have increased work of breathing. Maintaining saturations >90% on room air. Patient responds to voice when walking into the room however returns to sleep shortly after. Husband at bedside asking for an update. Dr. Tawanna Solo made aware.

## 2019-04-27 NOTE — Progress Notes (Signed)
PROGRESS NOTE    Joanne Burke  UXL:[id number] DOB: 08/16/1930 DOA: 04/25/2019 PCP: Patient, No Pcp Per   Brief Narrative:  Patient is a 85 year old female with no significant past medical history who presents with abdominal pain.  She started having left lower quadrant pain about 9 days ago, decreased p.o. intake.  She has not seen a doctor for many years.  It was reported that she was in pretty good health and is ambulatory, quite independent.  When she presented she was hypertensive.  She was found to have acute kidney injury, lactic acidosis, leukocytosis.  CT imaging done in the emergency department showed possible diverticulitis, abscess versus fluid in the abdomen.  Surgery consulted.  Nephrology also following for AKI.  Plan for partial colectomy/colostomy for diverticulitis as per surgery.  Assessment & Plan:   Principal Problem:   Diverticulitis of colon with perforation Active Problems:   Sepsis ([place])   Abdominal pain/diverticulitis: Presented with decreased oral intake, left lower quadrant abdominal pain. CT abdomen/pelvis with oral contrast  showed potential fluid collection or abscess in the left hemipelvis,diffuse wall thickening of the sigmoid colon concerning  for diverticulitis or colitis,small volume abdominal ascites. IR was consulted earlier for possible drainage of the abscess but IR states she is not a candidate for drainage. General surgery following and planning for partial colectomy and colostomy for diverticulitis  . Continue current Abx.Keep her NPO.  AKI : Creatinine was normal in 09/2018.AKI due to underlying abdominal infection, poor oral intake ,possibility of ATN after contrast exposure .CT showed patchy persistent bilateral nephrograms concerning for acute tubular necrosis.  Continue foley.  She has low urine output.  Acute respiratory failure with hypoxia:Patchy bilateral airspace opacities concerning for pneumonia.  Could be aspiration pneumonia or  pulmonary edema .  Continue current antibiotics.  Will decrease the rate of fluid.  Debility/deconditioning: Patient reports that she is quite ambulatory, independent and works on her yard.  Imaging showed bilateral acute to subacute sacral insufficiency fractures . we will request for PT evaluation when appropriate.          DVT prophylaxis: SCD Code Status: Full Family Communication: Discussed with husband on phone on 9.28.20 Disposition Plan: Undetermined at this point   Consultants: Surgery  Procedures:None  Antimicrobials:  Anti-infectives (From admission, onward)   Start     Dose/Rate Route Frequency Ordered Stop   04/27/19 1000  cefTRIAXone (ROCEPHIN) 2 g in sodium chloride 0.9 % 100 mL IVPB     2 g 200 mL/hr over 30 Minutes Intravenous Every 24 hours 04/27/19 0859     04/27/19 1000  metroNIDAZOLE (FLAGYL) IVPB 500 mg     500 mg 100 mL/hr over 60 Minutes Intravenous Every 8 hours 04/27/19 0859     04/26/19 2200  piperacillin-tazobactam (ZOSYN) IVPB 3.375 g  Status:  Discontinued     3.375 g 12.5 mL/hr over 240 Minutes Intravenous Every 12 hours 04/26/19 1253 04/27/19 0858   04/26/19 1800  aztreonam (AZACTAM) 1 g in sodium chloride 0.9 % 100 mL IVPB  Status:  Discontinued     1 g 200 mL/hr over 30 Minutes Intravenous Every 12 hours 04/26/19 0805 04/26/19 1253   04/25/19 2030  ciprofloxacin (CIPRO) IVPB 200 mg  Status:  Discontinued     200 mg 100 mL/hr over 60 Minutes Intravenous Every 12 hours 04/25/19 1109 04/25/19 1640   04/25/19 2030  aztreonam (AZACTAM) 1 g in sodium chloride 0.9 % 100 mL IVPB  Status:  Discontinued  1 g 200 mL/hr over 30 Minutes Intravenous Every 8 hours 04/25/19 1651 04/26/19 0805   04/25/19 1730  aztreonam (AZACTAM) 2 g in sodium chloride 0.9 % 100 mL IVPB  Status:  Discontinued     2 g 200 mL/hr over 30 Minutes Intravenous  Once 04/25/19 1640 04/25/19 1651   04/25/19 1600  metroNIDAZOLE (FLAGYL) IVPB 500 mg  Status:  Discontinued     500  mg 100 mL/hr over 60 Minutes Intravenous Every 8 hours 04/25/19 1109 04/26/19 1253   04/25/19 0730  ciprofloxacin (CIPRO) IVPB 400 mg     400 mg 200 mL/hr over 60 Minutes Intravenous  Once 04/25/19 0729 04/25/19 0921   04/25/19 0730  metroNIDAZOLE (FLAGYL) IVPB 500 mg     500 mg 100 mL/hr over 60 Minutes Intravenous  Once 04/25/19 0729 04/25/19 0924      Subjective:  Patient seen and examined at bedside this morning.  Noted to be confused .  Does not speak much.  Complains of abdominal pain.  She was also noted to be hypoxic and had to be put on oxygen.  Objective: Vitals:   04/27/19 0427 04/27/19 0800 04/27/19 0953 04/27/19 1133  BP:      Pulse:   (!) 105   Resp:  16 17 12   Temp: 97.9 F (36.6 C)     TempSrc: Oral     SpO2:  94% 93% 100%  Weight:      Height:        Intake/Output Summary (Last 24 hours) at 04/27/2019 1240 Last data filed at 04/27/2019 1148 Gross per 24 hour  Intake 1337.63 ml  Output 550 ml  Net 787.63 ml   Filed Weights   04/25/19 0336 04/25/19 1438  Weight: 40.8 kg 42.9 kg    Examination:  General exam: Elderly female in moderate distress due to abdominal pain  HEENT: Ear/Nose normal on gross exam Respiratory system: Bilateral decreased air entry, wheezes, rhonchi Cardiovascular system: S1 & S2 heard, RRR. No JVD, murmurs, rubs, gallops or clicks. No pedal edema. Gastrointestinal system: Abdomen is mildly distended, soft and is diffusely  tender .No bowel sounds. Central nervous system: Alert and awake but confused. Extremities: No edema, no clubbing ,no cyanosis Skin: No rashes, lesions or ulcers,no icterus ,no pallor    Data Reviewed: I have personally reviewed following labs and imaging studies  CBC: Recent Labs  Lab 04/25/19 0355 04/26/19 0324 04/27/19 0240  WBC 7.8 19.2* 20.6*  NEUTROABS  --   --  18.5*  HGB 13.1 11.4* 10.4*  HCT 40.0 33.3* 31.6*  MCV 85.3 83.9 84.5  PLT 166 220 144*   Basic Metabolic Panel: Recent Labs    Lab 04/25/19 0355 04/26/19 0324 04/27/19 0240  NA 131* 133* 135  K 3.6 4.3 4.4  CL 98 109 106  CO2 17* 14* 13*  GLUCOSE 99 84 58*  BUN 21 33* 44*  CREATININE 1.02* 2.08* 2.52*  CALCIUM 8.9 7.3* 7.8*  MG  --   --  1.7  PHOS  --   --  5.4*   GFR: Estimated Creatinine Clearance: 10.9 mL/min (A) (by C-G formula based on SCr of 2.52 mg/dL (H)). Liver Function Tests: Recent Labs  Lab 04/25/19 0355 04/27/19 0240  AST 46*  --   ALT 33  --   ALKPHOS 237*  --   BILITOT 1.7*  --   PROT 6.1*  --   ALBUMIN 2.4* 1.6*   Recent Labs  Lab 04/25/19 0355  LIPASE  15   No results for input(s): AMMONIA in the last 168 hours. Coagulation Profile: No results for input(s): INR, PROTIME in the last 168 hours. Cardiac Enzymes: No results for input(s): CKTOTAL, CKMB, CKMBINDEX, TROPONINI in the last 168 hours. BNP (last 3 results) No results for input(s): PROBNP in the last 8760 hours. HbA1C: No results for input(s): HGBA1C in the last 72 hours. CBG: No results for input(s): GLUCAP in the last 168 hours. Lipid Profile: No results for input(s): CHOL, HDL, LDLCALC, TRIG, CHOLHDL, LDLDIRECT in the last 72 hours. Thyroid Function Tests: No results for input(s): TSH, T4TOTAL, FREET4, T3FREE, THYROIDAB in the last 72 hours. Anemia Panel: No results for input(s): VITAMINB12, FOLATE, FERRITIN, TIBC, IRON, RETICCTPCT in the last 72 hours. Sepsis Labs: Recent Labs  Lab 04/25/19 1513 04/25/19 1700 04/25/19 2021 04/26/19 0810  PROCALCITON  --  147.34  --   --   LATICACIDVEN 3.2* 2.9* 1.6 1.6    Recent Results (from the past 240 hour(s))  SARS CORONAVIRUS 2 (TAT 6-24 HRS) Nasopharyngeal Nasopharyngeal Swab     Status: None   Collection Time: 04/25/19 10:57 AM   Specimen: Nasopharyngeal Swab  Result Value Ref Range Status   SARS Coronavirus 2 NEGATIVE NEGATIVE Final    Comment: (NOTE) SARS-CoV-2 target nucleic acids are NOT DETECTED. The SARS-CoV-2 RNA is generally detectable in upper  and lower respiratory specimens during the acute phase of infection. Negative results do not preclude SARS-CoV-2 infection, do not rule out co-infections with other pathogens, and should not be used as the sole basis for treatment or other patient management decisions. Negative results must be combined with clinical observations, patient history, and epidemiological information. The expected result is Negative. Fact Sheet for Patients: HairSlick.no Fact Sheet for Healthcare Providers: quierodirigir.com This test is not yet approved or cleared by the [place] FDA and  has been authorized for detection and/or diagnosis of SARS-CoV-2 by FDA under an Emergency Use Authorization (EUA). This EUA will remain  in effect (meaning this test can be used) for the duration of the COVID-19 declaration under Section 56 4(b)(1) of the Act, 21 U.S.C. section 360bbb-3(b)(1), unless the authorization is terminated or revoked sooner. Performed at [facility], [address] [place], [place] [address]   MRSA PCR Screening     Status: None   Collection Time: 04/25/19  2:48 PM   Specimen: Nasal Mucosa; Nasopharyngeal  Result Value Ref Range Status   MRSA by PCR NEGATIVE NEGATIVE Final    Comment:        The GeneXpert MRSA Assay (FDA approved for NASAL specimens only), is one component of a comprehensive MRSA colonization surveillance program. It is not intended to diagnose MRSA infection nor to guide or monitor treatment for MRSA infections. Performed at [facility], [address] [place], [place] [address]   Culture, blood (x 2)     Status: None (Preliminary result)   Collection Time: 04/25/19  5:05 PM   Specimen: BLOOD LEFT HAND  Result Value Ref Range Status   Specimen Description BLOOD LEFT HAND  Final   Special Requests   Final    AEROBIC BOTTLE ONLY Blood Culture results may not be optimal due to an inadequate  volume of blood received in culture bottles   Culture   Final    NO GROWTH 2 DAYS Performed at [facility], [address], [place], [place] [address]    Report Status PENDING  Incomplete  Culture, blood (x  2)     Status: None (Preliminary result)   Collection Time: 04/25/19  5:12 PM   Specimen: BLOOD RIGHT HAND  Result Value Ref Range Status   Specimen Description BLOOD RIGHT HAND  Final   Special Requests AEROBIC BOTTLE ONLY Blood Culture adequate volume  Final   Culture   Final    NO GROWTH 2 DAYS Performed at [facility], [address], [place], [place] [address]    Report Status PENDING  Incomplete         Radiology Studies: Ct Abdomen Pelvis Wo Contrast  Result Date: 04/26/2019 CLINICAL DATA:  Abdominal distension. EXAM: CT ABDOMEN AND PELVIS WITHOUT CONTRAST TECHNIQUE: Multidetector CT imaging of the abdomen and pelvis was performed following the standard protocol without IV contrast. COMPARISON:  CT dated 04/25/2019. FINDINGS: Lower chest: There are small bilateral pleural effusions with adjacent atelectasis.The heart size is relatively normal. Aortic calcifications and coronary artery calcifications are noted. Hepatobiliary: The liver is normal. There is hyperdense material within the gallbladder lumen likely representing vicarious excretion of contrast from the prior contrast enhanced examination.There is no biliary ductal dilation. Pancreas: Normal contours without ductal dilatation. No peripancreatic fluid collection. Spleen: No splenic laceration or hematoma. Adrenals/Urinary Tract: --Adrenal glands: No adrenal hemorrhage. --Right kidney/ureter: There is a persistent patchy nephrogram without evidence for hydronephrosis. --Left kidney/ureter: There is a persistent patchy nephrogram without hydronephrosis. --Urinary bladder: Unremarkable. Stomach/Bowel: --Stomach/Duodenum: There is oral contrast in the stomach and esophagus. There is a small to moderate-sized  hiatal hernia. --Small bowel: There are dilated loops of small bowel with air-fluid levels measuring up to approximately 4 cm. --Colon: There is scattered colonic diverticula. There appears to be diffuse wall thickening of the sigmoid colon --Appendix: The appendix is not reliably identified. Vascular/Lymphatic: Atherosclerotic calcification is present within the non-aneurysmal abdominal aorta, without hemodynamically significant stenosis. There is likely moderate to severe stenosis of the left common iliac artery secondary to advanced atherosclerotic disease. --No retroperitoneal lymphadenopathy. --No mesenteric lymphadenopathy. --No pelvic or inguinal lymphadenopathy. Reproductive: Unremarkable Other: Again identified is a questionable fluid collection in the left hemipelvis with a overall very similar appearance when compared to prior study. Evaluation this collection is limited by both lack of IV and oral contrast. There is a small volume of abdominal ascites. There is fat stranding within the patient's presacral region. There is a trace amount of pelvic free fluid. Musculoskeletal. Again noted is posterior fusion hardware through the visualized lower lumbar spine. There is a subacute fracture involving the sacrum. There are bilateral sacral insufficiency fractures. IMPRESSION: 1. Study is again very limited secondary to extensive streak artifact from the patient's lumbar fusion hardware in addition to a lack of IV contrast. 2. Again noted is a potential fluid collection or abscess in the left hemipelvis, with a similar distribution in size when compared to prior study. This is not well evaluated in the absence of IV contrast. 3. Diffuse wall thickening of the sigmoid colon raises concern for diverticulitis or colitis. 4. Bilateral acute to subacute sacral insufficiency fractures are noted. 5. Small volume abdominal ascites. 6. Patchy persistent bilateral nephrograms concerning for acute tubular necrosis.  Correlation with laboratory studies is recommended. 7. Small bilateral pleural effusions with adjacent atelectasis. Electronically Signed   By: Katherine Mantle M.D.   On: 04/26/2019 11:39   Dg Chest Port 1 View  Result Date: 04/27/2019 CLINICAL DATA:  Shortness of breath, desaturation EXAM: PORTABLE CHEST 1 VIEW COMPARISON:  None. FINDINGS: Patchy bilateral airspace disease noted  concerning for pneumonia. Possible small left pleural effusion. Heart is normal size. No acute bony abnormality. Degenerative changes in the thoracic spine and left shoulder. IMPRESSION: Patchy bilateral airspace opacities concerning for pneumonia. Possible small left effusion. Electronically Signed   By: Charlett NoseKevin  Dover M.D.   On: 04/27/2019 11:18        Scheduled Meds:  ipratropium-albuterol  3 mL Nebulization Q6H   morphine   Intravenous Q4H   Continuous Infusions:  cefTRIAXone (ROCEPHIN)  IV 2 g (04/27/19 0915)   metronidazole 500 mg (04/27/19 0942)    sodium bicarbonate (isotonic) infusion in sterile water 50 mL/hr at 04/27/19 1156     LOS: 2 days    Time spent:35 mins. More than 50% of that time was spent in counseling and/or coordination of care.      Burnadette PopAmrit Maclin Guerrette, MD [facility] Pager [phone]  If 7PM-7AM, please contact night-coverage [email or web address] Password [email or web address] 04/27/2019, 12:40 PM

## 2019-04-27 NOTE — Progress Notes (Addendum)
Patient ID: Asa Lente, female   DOB: [birth year], 85 y.o.   MRN: [id number] [facility] Progress Note   Assessment/ Plan:   1.  Acute kidney injury: This appears to be bi-factorial-on presentation likely AKI from sepsis/ATN and exacerbated by contrast exposure.  Some improvement of urine output noted overnight (550 cc) with uptrending creatinine which may indeed be approaching the plateau phase based on the delta rise.  I will switch her fluids to isotonic sodium bicarbonate at this time.  She appears to be encephalopathic which may indeed have multiple etiologies including narcotic, acute illness/hospital delirium and sundowning.  Her urine sodium of <10 is highly indicative that this is contrast nephropathy. 2.  Anion gap metabolic acidosis:  Begin isotonic sodium bicarbonate. 3.  Diverticulitis plus/minus pelvic abscess versus ascites: Ongoing evaluation by surgery and likely to undergo flexible sigmoidoscopy when stable to assess for possible malignancy.  Abdominal pain management per primary service. 4.  Hyponatremia: Possibly secondary to acute kidney injury and may indeed have baseline hyponatremia based on labs from earlier this year.  Sodium levels improving due to inadvertent fluid restriction with her current mental status. 5.  Debility/deconditioning 6.  Encephalopathy: Discontinue morphine and consider use of hydromorphone as needed for pain in the setting of her AKI.  Subjective:   Appears to be confused/hallucinating this morning.  Some improvement of urine output noted.   Objective:   BP 119/61   Pulse 99   Temp 97.9 F (36.6 C) (Oral)   Resp 13   Ht 5\' 2"  (1.575 m)   Wt 42.9 kg   SpO2 95%   BMI 17.30 kg/m   Intake/Output Summary (Last 24 hours) at 04/27/2019 0950 Last data filed at 04/27/2019 0630 Gross per 24 hour  Intake 1014.7 ml  Output 550 ml  Net 464.7 ml   Weight change:   Physical Exam: Gen: Confused, possibly hallucinating when asked  questions. CVS: Pulse regular rhythm, normal rate, S1 and S2 normal Resp: Poor inspiratory effort with decreased breath sounds over bases, intermittent wheezing audible Abd: Soft, flat, mild tenderness over lower quadrants. Ext: No lower extremity edema  Imaging: Ct Abdomen Pelvis Wo Contrast  Result Date: 04/26/2019 CLINICAL DATA:  Abdominal distension. EXAM: CT ABDOMEN AND PELVIS WITHOUT CONTRAST TECHNIQUE: Multidetector CT imaging of the abdomen and pelvis was performed following the standard protocol without IV contrast. COMPARISON:  CT dated 04/25/2019. FINDINGS: Lower chest: There are small bilateral pleural effusions with adjacent atelectasis.The heart size is relatively normal. Aortic calcifications and coronary artery calcifications are noted. Hepatobiliary: The liver is normal. There is hyperdense material within the gallbladder lumen likely representing vicarious excretion of contrast from the prior contrast enhanced examination.There is no biliary ductal dilation. Pancreas: Normal contours without ductal dilatation. No peripancreatic fluid collection. Spleen: No splenic laceration or hematoma. Adrenals/Urinary Tract: --Adrenal glands: No adrenal hemorrhage. --Right kidney/ureter: There is a persistent patchy nephrogram without evidence for hydronephrosis. --Left kidney/ureter: There is a persistent patchy nephrogram without hydronephrosis. --Urinary bladder: Unremarkable. Stomach/Bowel: --Stomach/Duodenum: There is oral contrast in the stomach and esophagus. There is a small to moderate-sized hiatal hernia. --Small bowel: There are dilated loops of small bowel with air-fluid levels measuring up to approximately 4 cm. --Colon: There is scattered colonic diverticula. There appears to be diffuse wall thickening of the sigmoid colon --Appendix: The appendix is not reliably identified. Vascular/Lymphatic: Atherosclerotic calcification is present within the non-aneurysmal abdominal aorta, without  hemodynamically significant stenosis. There is likely moderate to severe stenosis of the  left common iliac artery secondary to advanced atherosclerotic disease. --No retroperitoneal lymphadenopathy. --No mesenteric lymphadenopathy. --No pelvic or inguinal lymphadenopathy. Reproductive: Unremarkable Other: Again identified is a questionable fluid collection in the left hemipelvis with a overall very similar appearance when compared to prior study. Evaluation this collection is limited by both lack of IV and oral contrast. There is a small volume of abdominal ascites. There is fat stranding within the patient's presacral region. There is a trace amount of pelvic free fluid. Musculoskeletal. Again noted is posterior fusion hardware through the visualized lower lumbar spine. There is a subacute fracture involving the sacrum. There are bilateral sacral insufficiency fractures. IMPRESSION: 1. Study is again very limited secondary to extensive streak artifact from the patient's lumbar fusion hardware in addition to a lack of IV contrast. 2. Again noted is a potential fluid collection or abscess in the left hemipelvis, with a similar distribution in size when compared to prior study. This is not well evaluated in the absence of IV contrast. 3. Diffuse wall thickening of the sigmoid colon raises concern for diverticulitis or colitis. 4. Bilateral acute to subacute sacral insufficiency fractures are noted. 5. Small volume abdominal ascites. 6. Patchy persistent bilateral nephrograms concerning for acute tubular necrosis. Correlation with laboratory studies is recommended. 7. Small bilateral pleural effusions with adjacent atelectasis. Electronically Signed   By: Constance Holster M.D.   On: 04/26/2019 11:39    Labs: BMET Recent Labs  Lab 04/25/19 0355 04/26/19 0324 04/27/19 0240  NA 131* 133* 135  K 3.6 4.3 4.4  CL 98 109 106  CO2 17* 14* 13*  GLUCOSE 99 84 58*  BUN 21 33* 44*  CREATININE 1.02* 2.08* 2.52*   CALCIUM 8.9 7.3* 7.8*  PHOS  --   --  5.4*   CBC Recent Labs  Lab 04/25/19 0355 04/26/19 0324 04/27/19 0240  WBC 7.8 19.2* 20.6*  NEUTROABS  --   --  18.5*  HGB 13.1 11.4* 10.4*  HCT 40.0 33.3* 31.6*  MCV 85.3 83.9 84.5  PLT 166 220 144*    Medications:    . morphine   Intravenous Q4H      Elmarie Shiley, MD 04/27/2019, 9:50 AM

## 2019-04-27 NOTE — Progress Notes (Signed)
[facility] Progress Note     Subjective: CC-  Patient states that she has less abdominal pain, but she is still very tender on exam. More confused this morning. Denies n/v. Passing some flatus. No BM since admission. WBC remains elevated 20.6, afebrile. Nephrology following for AKI.  Objective: Vital signs in last 24 hours: Temp:  [97.9 F (36.6 C)-98.9 F (37.2 C)] 97.9 F (36.6 C) (09/29 0427) Pulse Rate:  [99-108] 105 (09/29 0953) Resp:  [10-17] 17 (09/29 0953) BP: (92-119)/(61-67) 119/61 (09/29 0010) SpO2:  [93 %-97 %] 93 % (09/29 0953) Last BM Date: (PTA)  Intake/Output from previous day: 09/28 0701 - 09/29 0700 In: 1114.7 [I.V.:1014.7; IV Piggyback:100] Out: 550 [Urine:550] Intake/Output this shift: No intake/output data recorded.  PE: Gen:  Alert but confused, NAD HEENT: EOM's intact, pupils equal and round Card: tachy Pulm:  Rate and effort normal Abd: well healed midline and RLQincisions,mostlysoft,mild distension, diffusely tender but mostly in lower abdominal quadrants L>R,no peritonitis Skin: no rashes noted, warm and dry  Lab Results:  Recent Labs    04/26/19 0324 04/27/19 0240  WBC 19.2* 20.6*  HGB 11.4* 10.4*  HCT 33.3* 31.6*  PLT 220 144*   BMET Recent Labs    04/26/19 0324 04/27/19 0240  NA 133* 135  K 4.3 4.4  CL 109 106  CO2 14* 13*  GLUCOSE 84 58*  BUN 33* 44*  CREATININE 2.08* 2.52*  CALCIUM 7.3* 7.8*   PT/INR No results for input(s): LABPROT, INR in the last 72 hours. CMP     Component Value Date/Time   NA 135 04/27/2019 0240   K 4.4 04/27/2019 0240   CL 106 04/27/2019 0240   CO2 13 (L) 04/27/2019 0240   GLUCOSE 58 (L) 04/27/2019 0240   BUN 44 (H) 04/27/2019 0240   CREATININE 2.52 (H) 04/27/2019 0240   CALCIUM 7.8 (L) 04/27/2019 0240   PROT 6.1 (L) 04/25/2019 0355   ALBUMIN 1.6 (L) 04/27/2019 0240   AST 46 (H) 04/25/2019 0355   ALT 33 04/25/2019 0355   ALKPHOS 237 (H) 04/25/2019 0355   BILITOT  1.7 (H) 04/25/2019 0355   GFRNONAA 17 (L) 04/27/2019 0240   GFRAA 19 (L) 04/27/2019 0240   Lipase     Component Value Date/Time   LIPASE 15 04/25/2019 0355       Studies/Results: Ct Abdomen Pelvis Wo Contrast  Result Date: 04/26/2019 CLINICAL DATA:  Abdominal distension. EXAM: CT ABDOMEN AND PELVIS WITHOUT CONTRAST TECHNIQUE: Multidetector CT imaging of the abdomen and pelvis was performed following the standard protocol without IV contrast. COMPARISON:  CT dated 04/25/2019. FINDINGS: Lower chest: There are small bilateral pleural effusions with adjacent atelectasis.The heart size is relatively normal. Aortic calcifications and coronary artery calcifications are noted. Hepatobiliary: The liver is normal. There is hyperdense material within the gallbladder lumen likely representing vicarious excretion of contrast from the prior contrast enhanced examination.There is no biliary ductal dilation. Pancreas: Normal contours without ductal dilatation. No peripancreatic fluid collection. Spleen: No splenic laceration or hematoma. Adrenals/Urinary Tract: --Adrenal glands: No adrenal hemorrhage. --Right kidney/ureter: There is a persistent patchy nephrogram without evidence for hydronephrosis. --Left kidney/ureter: There is a persistent patchy nephrogram without hydronephrosis. --Urinary bladder: Unremarkable. Stomach/Bowel: --Stomach/Duodenum: There is oral contrast in the stomach and esophagus. There is a small to moderate-sized hiatal hernia. --Small bowel: There are dilated loops of small bowel with air-fluid levels measuring up to approximately 4 cm. --Colon: There is scattered colonic diverticula. There appears to be diffuse wall thickening  of the sigmoid colon --Appendix: The appendix is not reliably identified. Vascular/Lymphatic: Atherosclerotic calcification is present within the non-aneurysmal abdominal aorta, without hemodynamically significant stenosis. There is likely moderate to severe stenosis  of the left common iliac artery secondary to advanced atherosclerotic disease. --No retroperitoneal lymphadenopathy. --No mesenteric lymphadenopathy. --No pelvic or inguinal lymphadenopathy. Reproductive: Unremarkable Other: Again identified is a questionable fluid collection in the left hemipelvis with a overall very similar appearance when compared to prior study. Evaluation this collection is limited by both lack of IV and oral contrast. There is a small volume of abdominal ascites. There is fat stranding within the patient's presacral region. There is a trace amount of pelvic free fluid. Musculoskeletal. Again noted is posterior fusion hardware through the visualized lower lumbar spine. There is a subacute fracture involving the sacrum. There are bilateral sacral insufficiency fractures. IMPRESSION: 1. Study is again very limited secondary to extensive streak artifact from the patient's lumbar fusion hardware in addition to a lack of IV contrast. 2. Again noted is a potential fluid collection or abscess in the left hemipelvis, with a similar distribution in size when compared to prior study. This is not well evaluated in the absence of IV contrast. 3. Diffuse wall thickening of the sigmoid colon raises concern for diverticulitis or colitis. 4. Bilateral acute to subacute sacral insufficiency fractures are noted. 5. Small volume abdominal ascites. 6. Patchy persistent bilateral nephrograms concerning for acute tubular necrosis. Correlation with laboratory studies is recommended. 7. Small bilateral pleural effusions with adjacent atelectasis. Electronically Signed   By: Constance Holster M.D.   On: 04/26/2019 11:39    Anti-infectives: Anti-infectives (From admission, onward)   Start     Dose/Rate Route Frequency Ordered Stop   04/27/19 1000  cefTRIAXone (ROCEPHIN) 2 g in sodium chloride 0.9 % 100 mL IVPB     2 g 200 mL/hr over 30 Minutes Intravenous Every 24 hours 04/27/19 0859     04/27/19 1000   metroNIDAZOLE (FLAGYL) IVPB 500 mg     500 mg 100 mL/hr over 60 Minutes Intravenous Every 8 hours 04/27/19 0859     04/26/19 2200  piperacillin-tazobactam (ZOSYN) IVPB 3.375 g  Status:  Discontinued     3.375 g 12.5 mL/hr over 240 Minutes Intravenous Every 12 hours 04/26/19 1253 04/27/19 0858   04/26/19 1800  aztreonam (AZACTAM) 1 g in sodium chloride 0.9 % 100 mL IVPB  Status:  Discontinued     1 g 200 mL/hr over 30 Minutes Intravenous Every 12 hours 04/26/19 0805 04/26/19 1253   04/25/19 2030  ciprofloxacin (CIPRO) IVPB 200 mg  Status:  Discontinued     200 mg 100 mL/hr over 60 Minutes Intravenous Every 12 hours 04/25/19 1109 04/25/19 1640   04/25/19 2030  aztreonam (AZACTAM) 1 g in sodium chloride 0.9 % 100 mL IVPB  Status:  Discontinued     1 g 200 mL/hr over 30 Minutes Intravenous Every 8 hours 04/25/19 1651 04/26/19 0805   04/25/19 1730  aztreonam (AZACTAM) 2 g in sodium chloride 0.9 % 100 mL IVPB  Status:  Discontinued     2 g 200 mL/hr over 30 Minutes Intravenous  Once 04/25/19 1640 04/25/19 1651   04/25/19 1600  metroNIDAZOLE (FLAGYL) IVPB 500 mg  Status:  Discontinued     500 mg 100 mL/hr over 60 Minutes Intravenous Every 8 hours 04/25/19 1109 04/26/19 1253   04/25/19 0730  ciprofloxacin (CIPRO) IVPB 400 mg     400 mg 200 mL/hr over 60 Minutes  Intravenous  Once 04/25/19 0729 04/25/19 0921   04/25/19 0730  metroNIDAZOLE (FLAGYL) IVPB 500 mg     500 mg 100 mL/hr over 60 Minutes Intravenous  Once 04/25/19 0729 04/25/19 5638       Assessment/Plan Constipation Tobacco abuse AKI - Cr 2.52, nephrology following  Pelvic fluid collection Suspect perforated diverticulitis - 1 week of worsening lower abdominal pain, 1 prior episode in early summer that resolved without intervention - she has never had a colonoscopy - CTA 9/27 showed11.4 x 2.4 x 2.1 cm collection of fluid and gas in theleft pelvis that appears extraluminal with rim enhancement, etiology difficult to discern  but could be diverticular disease or possible colonic neoplasm - IR states there is currently nothing to drain. ?possible ascites in pelvis. Recommend repeat CT scan with oral and IV contrast - CEA 4.2  ID -cipro 9/27 x1, flagyl 9/27>> , aztreonam 9/27>>9/28, zosyn 9/28>>9/29, rocephin 9/29>> VTE -SCDs, lovenox FEN -IVF, NPO Foley -wick Follow up -TBD  Plan: Patient with little to no improvement. Continue bowel rest and broad spectrum antibiotics today. If she does not start to improve will likely consider surgery in 1-2 days. I will contact her husband today.   LOS: 2 days    Franne Forts , [facility] 04/27/2019, 10:51 AM Pager: [phone] Mon-Thurs 7:00 am-4:30 pm Fri 7:00 am -11:30 AM Sat-Sun 7:00 am-11:30 am

## 2019-04-28 ENCOUNTER — Encounter (HOSPITAL_COMMUNITY)
Admission: EM | Disposition: A | Discharge status: Home Health | Payer: Self-pay | Source: Home / Self Care | Attending: Internal Medicine

## 2019-04-28 ENCOUNTER — Inpatient Hospital Stay (HOSPITAL_COMMUNITY): Payer: Medicare HMO | Admitting: Certified Registered Nurse Anesthetist

## 2019-04-28 ENCOUNTER — Encounter (HOSPITAL_COMMUNITY): Payer: Self-pay | Admitting: Certified Registered Nurse Anesthetist

## 2019-04-28 HISTORY — PX: GASTROSTOMY: SHX5249

## 2019-04-28 HISTORY — PX: LAPAROSCOPIC SIGMOID COLECTOMY: SHX5928

## 2019-04-28 HISTORY — PX: LAPAROTOMY: SHX154

## 2019-04-28 HISTORY — PX: COLOSTOMY: SHX63

## 2019-04-28 LAB — RENAL FUNCTION PANEL
Albumin: 1.7 g/dL — ABNORMAL LOW (ref 3.5–5.0)
Anion gap: 13 (ref 5–15)
BUN: 40 mg/dL — ABNORMAL HIGH (ref 8–23)
CO2: 22 mmol/L (ref 22–32)
Calcium: 8.1 mg/dL — ABNORMAL LOW (ref 8.9–10.3)
Chloride: 108 mmol/L (ref 98–111)
Creatinine, Ser: 1.8 mg/dL — ABNORMAL HIGH (ref 0.44–1.00)
GFR calc Af Amer: 29 mL/min — ABNORMAL LOW (ref >60)
GFR calc non Af Amer: 25 mL/min — ABNORMAL LOW (ref >60)
Glucose, Bld: 100 mg/dL — ABNORMAL HIGH (ref 70–99)
Phosphorus: 3.9 mg/dL (ref 2.5–4.6)
Potassium: 4.2 mmol/L (ref 3.5–5.1)
Sodium: 143 mmol/L (ref 135–145)

## 2019-04-28 LAB — CBC WITH DIFFERENTIAL/PLATELET
Abs Immature Granulocytes: 0 10*3/uL (ref 0.00–0.07)
Basophils Absolute: 0 10*3/uL (ref 0.0–0.1)
Basophils Relative: 0 %
Eosinophils Absolute: 0 10*3/uL (ref 0.0–0.5)
Eosinophils Relative: 0 %
HCT: 28.7 % — ABNORMAL LOW (ref 36.0–46.0)
Hemoglobin: 9.9 g/dL — ABNORMAL LOW (ref 12.0–15.0)
Lymphocytes Relative: 4 %
Lymphs Abs: 1 10*3/uL (ref 0.7–4.0)
MCH: 28.5 pg (ref 26.0–34.0)
MCHC: 34.5 g/dL (ref 30.0–36.0)
MCV: 82.7 fL (ref 80.0–100.0)
Monocytes Absolute: 0.5 10*3/uL (ref 0.1–1.0)
Monocytes Relative: 2 %
Neutro Abs: 24.4 10*3/uL — ABNORMAL HIGH (ref 1.7–7.7)
Neutrophils Relative %: 94 %
Platelets: 137 10*3/uL — ABNORMAL LOW (ref 150–400)
RBC: 3.47 MIL/uL — ABNORMAL LOW (ref 3.87–5.11)
RDW: 18.6 % — ABNORMAL HIGH (ref 11.5–15.5)
WBC: 26 10*3/uL — ABNORMAL HIGH (ref 4.0–10.5)
nRBC: 0 /100 WBC
nRBC: 0.1 % (ref 0.0–0.2)

## 2019-04-28 LAB — HEMOGLOBIN AND HEMATOCRIT, BLOOD
HCT: 21.7 % — ABNORMAL LOW (ref 36.0–46.0)
Hemoglobin: 7.6 g/dL — ABNORMAL LOW (ref 12.0–15.0)

## 2019-04-28 LAB — BASIC METABOLIC PANEL
Anion gap: 14 (ref 5–15)
BUN: 40 mg/dL — ABNORMAL HIGH (ref 8–23)
CO2: 22 mmol/L (ref 22–32)
Calcium: 8.1 mg/dL — ABNORMAL LOW (ref 8.9–10.3)
Chloride: 109 mmol/L (ref 98–111)
Creatinine, Ser: 1.87 mg/dL — ABNORMAL HIGH (ref 0.44–1.00)
GFR calc Af Amer: 28 mL/min — ABNORMAL LOW (ref >60)
GFR calc non Af Amer: 24 mL/min — ABNORMAL LOW (ref >60)
Glucose, Bld: 102 mg/dL — ABNORMAL HIGH (ref 70–99)
Potassium: 4.4 mmol/L (ref 3.5–5.1)
Sodium: 145 mmol/L (ref 135–145)

## 2019-04-28 LAB — PROTIME-INR
INR: 1.5 — ABNORMAL HIGH (ref 0.8–1.2)
Prothrombin Time: 17.5 seconds — ABNORMAL HIGH (ref 11.4–15.2)

## 2019-04-28 LAB — GLUCOSE, CAPILLARY
Glucose-Capillary: 181 mg/dL — ABNORMAL HIGH (ref 70–99)
Glucose-Capillary: 95 mg/dL (ref 70–99)

## 2019-04-28 LAB — ABO/RH: ABO/RH(D): O NEG

## 2019-04-28 LAB — LACTIC ACID, PLASMA: Lactic Acid, Venous: 1.8 mmol/L (ref 0.5–1.9)

## 2019-04-28 LAB — PREPARE RBC (CROSSMATCH)

## 2019-04-28 LAB — APTT: aPTT: 27 seconds (ref 24–36)

## 2019-04-28 LAB — PREALBUMIN: Prealbumin: <5 mg/dL — ABNORMAL LOW (ref 18–38)

## 2019-04-28 SURGERY — LAPAROTOMY, EXPLORATORY
Anesthesia: General | Site: Abdomen

## 2019-04-28 MED ORDER — METHOCARBAMOL 1000 MG/10ML IJ SOLN
500.0000 mg | Freq: Three times a day (TID) | INTRAVENOUS | Status: DC | PRN
  Filled 2019-04-28: qty 5

## 2019-04-28 MED ORDER — PHENYLEPHRINE 40 MCG/ML (10ML) SYRINGE FOR IV PUSH (FOR BLOOD PRESSURE SUPPORT)
PREFILLED_SYRINGE | INTRAVENOUS | Status: DC | PRN
  Administered 2019-04-28: 80 ug via INTRAVENOUS

## 2019-04-28 MED ORDER — ACETAMINOPHEN 10 MG/ML IV SOLN
INTRAVENOUS | Status: AC
  Administered 2019-04-28: 15:00:00 1000 mg via INTRAVENOUS
  Filled 2019-04-28: qty 100

## 2019-04-28 MED ORDER — CHLORHEXIDINE GLUCONATE CLOTH 2 % EX PADS
6.0000 | MEDICATED_PAD | Freq: Every day | CUTANEOUS | Status: DC
  Administered 2019-04-28 – 2019-05-16 (×16): 6 via TOPICAL

## 2019-04-28 MED ORDER — SODIUM CHLORIDE 0.9 % IV SOLN
INTRAVENOUS | Status: DC | PRN
  Administered 2019-04-28: 12:00:00 via INTRAVENOUS

## 2019-04-28 MED ORDER — ROCURONIUM BROMIDE 10 MG/ML (PF) SYRINGE
PREFILLED_SYRINGE | INTRAVENOUS | Status: DC | PRN
  Administered 2019-04-28: 50 mg via INTRAVENOUS

## 2019-04-28 MED ORDER — CHLORHEXIDINE GLUCONATE CLOTH 2 % EX PADS: 6.0000 | MEDICATED_PAD | Freq: Once | CUTANEOUS | Status: AC

## 2019-04-28 MED ORDER — DEXAMETHASONE SODIUM PHOSPHATE 10 MG/ML IJ SOLN
INTRAMUSCULAR | Status: DC | PRN
  Administered 2019-04-28: 5 mg via INTRAVENOUS

## 2019-04-28 MED ORDER — FENTANYL CITRATE (PF) 100 MCG/2ML IJ SOLN
INTRAMUSCULAR | Status: AC
  Administered 2019-04-28: 25 ug via INTRAVENOUS
  Filled 2019-04-28: qty 2

## 2019-04-28 MED ORDER — SODIUM CHLORIDE 0.9 % IV SOLN
INTRAVENOUS | Status: DC | PRN
  Administered 2019-04-28: 12:00:00 20 ug/min via INTRAVENOUS

## 2019-04-28 MED ORDER — FENTANYL CITRATE (PF) 250 MCG/5ML IJ SOLN
INTRAMUSCULAR | Status: AC
  Filled 2019-04-28: qty 5

## 2019-04-28 MED ORDER — SUCCINYLCHOLINE CHLORIDE 20 MG/ML IJ SOLN
INTRAMUSCULAR | Status: DC | PRN
  Administered 2019-04-28: 100 mg via INTRAVENOUS

## 2019-04-28 MED ORDER — SODIUM CHLORIDE 0.9% IV SOLUTION: Freq: Once | INTRAVENOUS | Status: DC

## 2019-04-28 MED ORDER — PROPOFOL 10 MG/ML IV BOLUS
INTRAVENOUS | Status: DC | PRN
  Administered 2019-04-28: 20 mg via INTRAVENOUS
  Administered 2019-04-28: 40 mg via INTRAVENOUS

## 2019-04-28 MED ORDER — IPRATROPIUM-ALBUTEROL 0.5-2.5 (3) MG/3ML IN SOLN
3.0000 mL | Freq: Two times a day (BID) | RESPIRATORY_TRACT | Status: DC
  Administered 2019-04-29 (×2): 3 mL via RESPIRATORY_TRACT
  Filled 2019-04-28 (×2): qty 3

## 2019-04-28 MED ORDER — ALBUMIN HUMAN 5 % IV SOLN
INTRAVENOUS | Status: DC | PRN
  Administered 2019-04-28: 13:00:00 via INTRAVENOUS

## 2019-04-28 MED ORDER — SODIUM CHLORIDE 0.9 % IV BOLUS
500.0000 mL | Freq: Once | INTRAVENOUS | Status: AC
  Administered 2019-04-28: 02:00:00 500 mL via INTRAVENOUS

## 2019-04-28 MED ORDER — HALOPERIDOL LACTATE 5 MG/ML IJ SOLN
0.5000 mg | Freq: Once | INTRAMUSCULAR | Status: AC
  Administered 2019-04-28: 0.5 mg via INTRAVENOUS
  Filled 2019-04-28: qty 1

## 2019-04-28 MED ORDER — ONDANSETRON HCL 4 MG/2ML IJ SOLN
INTRAMUSCULAR | Status: DC | PRN
  Administered 2019-04-28: 4 mg via INTRAVENOUS

## 2019-04-28 MED ORDER — FENTANYL CITRATE (PF) 100 MCG/2ML IJ SOLN
25.0000 ug | INTRAMUSCULAR | Status: DC | PRN
  Administered 2019-04-28 (×4): 25 ug via INTRAVENOUS

## 2019-04-28 MED ORDER — 0.9 % SODIUM CHLORIDE (POUR BTL) OPTIME
TOPICAL | Status: DC | PRN
  Administered 2019-04-28: 13:00:00 3000 mL
  Administered 2019-04-28: 2000 mL

## 2019-04-28 MED ORDER — LIDOCAINE 2% (20 MG/ML) 5 ML SYRINGE
INTRAMUSCULAR | Status: DC | PRN
  Administered 2019-04-28: 60 mg via INTRAVENOUS

## 2019-04-28 MED ORDER — FENTANYL CITRATE (PF) 250 MCG/5ML IJ SOLN
INTRAMUSCULAR | Status: DC | PRN
  Administered 2019-04-28: 75 ug via INTRAVENOUS
  Administered 2019-04-28: 25 ug via INTRAVENOUS

## 2019-04-28 MED ORDER — ACETAMINOPHEN 10 MG/ML IV SOLN
1000.0000 mg | Freq: Four times a day (QID) | INTRAVENOUS | Status: AC
  Administered 2019-04-28 – 2019-04-29 (×4): 1000 mg via INTRAVENOUS
  Filled 2019-04-28 (×3): qty 100

## 2019-04-28 MED ORDER — SUGAMMADEX SODIUM 200 MG/2ML IV SOLN
INTRAVENOUS | Status: DC | PRN
  Administered 2019-04-28: 50 mg via INTRAVENOUS
  Administered 2019-04-28: 100 mg via INTRAVENOUS

## 2019-04-28 MED ORDER — MORPHINE SULFATE (PF) 2 MG/ML IV SOLN
1.0000 mg | INTRAVENOUS | Status: DC | PRN
  Administered 2019-04-28 – 2019-04-30 (×5): 2 mg via INTRAVENOUS
  Administered 2019-04-30: 1 mg via INTRAVENOUS
  Administered 2019-04-30 – 2019-05-01 (×2): 2 mg via INTRAVENOUS
  Administered 2019-05-01: 20:00:00 1 mg via INTRAVENOUS
  Administered 2019-05-02: 16:00:00 2 mg via INTRAVENOUS
  Administered 2019-05-02 – 2019-05-03 (×3): 1 mg via INTRAVENOUS
  Filled 2019-04-28 (×13): qty 1

## 2019-04-28 SURGICAL SUPPLY — 97 items
APL PRP STRL LF DISP 70% ISPRP (MISCELLANEOUS) ×1
APPLIER CLIP 5 13 M/L LIGAMAX5 (MISCELLANEOUS)
APR CLP MED LRG 5 ANG JAW (MISCELLANEOUS)
BAG URINE DRAINAGE (UROLOGICAL SUPPLIES) ×3 IMPLANT
BLADE CLIPPER SURG (BLADE) IMPLANT
BLADE SURG 10 STRL SS (BLADE) ×3 IMPLANT
CANISTER SUCT 3000ML PPV (MISCELLANEOUS) ×3 IMPLANT
CATH GASTROSTOMY 20FR (CATHETERS) IMPLANT
CELLS DAT CNTRL 66122 CELL SVR (MISCELLANEOUS) IMPLANT
CHLORAPREP W/TINT 26 (MISCELLANEOUS) ×3 IMPLANT
CLIP APPLIE 5 13 M/L LIGAMAX5 (MISCELLANEOUS) IMPLANT
COVER MAYO STAND STRL (DRAPES) ×6 IMPLANT
COVER SURGICAL LIGHT HANDLE (MISCELLANEOUS) ×3 IMPLANT
COVER WAND RF STERILE (DRAPES) ×3 IMPLANT
DRAPE HALF SHEET 40X57 (DRAPES) ×3 IMPLANT
DRAPE LAPAROSCOPIC ABDOMINAL (DRAPES) ×3 IMPLANT
DRAPE UTILITY XL STRL (DRAPES) ×3 IMPLANT
DRAPE WARM FLUID 44X44 (DRAPES) ×3 IMPLANT
DRSG OPSITE POSTOP 4X10 (GAUZE/BANDAGES/DRESSINGS) ×2 IMPLANT
DRSG OPSITE POSTOP 4X8 (GAUZE/BANDAGES/DRESSINGS) IMPLANT
ELECT BLADE 6.5 EXT (BLADE) ×3 IMPLANT
ELECT CAUTERY BLADE 6.4 (BLADE) ×6 IMPLANT
ELECT REM PT RETURN 9FT ADLT (ELECTROSURGICAL) ×3
ELECTRODE REM PT RTRN 9FT ADLT (ELECTROSURGICAL) ×1 IMPLANT
GAUZE SPONGE 4X4 12PLY STRL (GAUZE/BANDAGES/DRESSINGS) ×3 IMPLANT
GEL ULTRASOUND 20GR AQUASONIC (MISCELLANEOUS) IMPLANT
GLOVE BIO SURGEON STRL SZ8 (GLOVE) ×6 IMPLANT
GLOVE BIOGEL PI IND STRL 7.0 (GLOVE) IMPLANT
GLOVE BIOGEL PI IND STRL 8 (GLOVE) ×2 IMPLANT
GLOVE BIOGEL PI INDICATOR 7.0 (GLOVE) ×6
GLOVE BIOGEL PI INDICATOR 8 (GLOVE) ×4
GLOVE SURG SS PI 6.5 STRL IVOR (GLOVE) ×6 IMPLANT
GOWN STRL REUS W/ TWL LRG LVL3 (GOWN DISPOSABLE) ×6 IMPLANT
GOWN STRL REUS W/ TWL XL LVL3 (GOWN DISPOSABLE) ×2 IMPLANT
GOWN STRL REUS W/TWL LRG LVL3 (GOWN DISPOSABLE) ×18
GOWN STRL REUS W/TWL XL LVL3 (GOWN DISPOSABLE) ×6
HANDLE SUCTION POOLE (INSTRUMENTS) ×1 IMPLANT
KIT BASIN OR (CUSTOM PROCEDURE TRAY) ×3 IMPLANT
KIT OSTOMY DRAINABLE 2.75 STR (WOUND CARE) ×3 IMPLANT
KIT TURNOVER KIT B (KITS) ×3 IMPLANT
LEGGING LITHOTOMY PAIR STRL (DRAPES) ×3 IMPLANT
LIGASURE IMPACT 36 18CM CVD LR (INSTRUMENTS) ×2 IMPLANT
LIGASURE SHORT ATLAS 20CM (ELECTROSURGICAL) ×3 IMPLANT
NS IRRIG 1000ML POUR BTL (IV SOLUTION) ×6 IMPLANT
PACK GENERAL/GYN (CUSTOM PROCEDURE TRAY) ×3 IMPLANT
PAD ARMBOARD 7.5X6 YLW CONV (MISCELLANEOUS) ×3 IMPLANT
PENCIL SMOKE EVACUATOR (MISCELLANEOUS) ×6 IMPLANT
RELOAD PROXIMATE 75MM BLUE (ENDOMECHANICALS) ×3 IMPLANT
RELOAD STAPLE 75 3.8 BLU REG (ENDOMECHANICALS) IMPLANT
RETRACTOR WND ALEXIS 18 MED (MISCELLANEOUS) IMPLANT
RTRCTR WOUND ALEXIS 18CM MED (MISCELLANEOUS)
SCISSORS LAP 5X35 DISP (ENDOMECHANICALS) ×3 IMPLANT
SET IRRIG TUBING LAPAROSCOPIC (IRRIGATION / IRRIGATOR) IMPLANT
SET TUBE SMOKE EVAC HIGH FLOW (TUBING) ×3 IMPLANT
SHEARS HARMONIC ACE PLUS 36CM (ENDOMECHANICALS) IMPLANT
SLEEVE ENDOPATH XCEL 5M (ENDOMECHANICALS) ×6 IMPLANT
SPECIMEN JAR LARGE (MISCELLANEOUS) IMPLANT
SPONGE LAP 18X18 RF (DISPOSABLE) ×3 IMPLANT
STAPLER GUN LINEAR PROX 60 (STAPLE) ×2 IMPLANT
STAPLER PROXIMATE 75MM BLUE (STAPLE) ×2 IMPLANT
STAPLER VISISTAT 35W (STAPLE) ×3 IMPLANT
SUCTION POOLE HANDLE (INSTRUMENTS) ×3
SUT ETHILON 2 0 FS 18 (SUTURE) ×3 IMPLANT
SUT MON AB 3-0 SH 27 (SUTURE)
SUT MON AB 3-0 SH27 (SUTURE) IMPLANT
SUT PDS AB 0 CT 36 (SUTURE) ×6 IMPLANT
SUT PDS AB 1 CTX 36 (SUTURE) IMPLANT
SUT PDS AB 1 TP1 96 (SUTURE) ×6 IMPLANT
SUT PROLENE 2 0 SH DA (SUTURE) IMPLANT
SUT SILK 2 0 TIES 10X30 (SUTURE) ×5 IMPLANT
SUT SILK 3 0 TIES 10X30 (SUTURE) ×3 IMPLANT
SUT VIC AB 2-0 SH 18 (SUTURE) ×6 IMPLANT
SUT VIC AB 3-0 SH 18 (SUTURE) ×3 IMPLANT
SUT VICRYL AB 2 0 TIES (SUTURE) ×3 IMPLANT
SUT VICRYL AB 3 0 TIES (SUTURE) ×3 IMPLANT
SYR 10ML LL (SYRINGE) ×3 IMPLANT
SYR 20ML LL LF (SYRINGE) ×3 IMPLANT
SYR BULB IRRIGATION 50ML (SYRINGE) ×3 IMPLANT
SYRINGE TOOMEY DISP (SYRINGE) ×3 IMPLANT
SYS LAPSCP GELPORT 120MM (MISCELLANEOUS)
SYSTEM LAPSCP GELPORT 120MM (MISCELLANEOUS) IMPLANT
TOWEL GREEN STERILE (TOWEL DISPOSABLE) ×6 IMPLANT
TOWEL GREEN STERILE FF (TOWEL DISPOSABLE) ×3 IMPLANT
TRAY FOLEY MTR SLVR 14FR STAT (SET/KITS/TRAYS/PACK) IMPLANT
TRAY FOLEY MTR SLVR 16FR STAT (SET/KITS/TRAYS/PACK) ×3 IMPLANT
TRAY LAPAROSCOPIC MC (CUSTOM PROCEDURE TRAY) ×3 IMPLANT
TRAY PROCTOSCOPIC FIBER OPTIC (SET/KITS/TRAYS/PACK) ×3 IMPLANT
TROCAR XCEL 12X100 BLDLESS (ENDOMECHANICALS) IMPLANT
TROCAR XCEL BLUNT TIP 100MML (ENDOMECHANICALS) IMPLANT
TROCAR XCEL NON-BLD 11X100MML (ENDOMECHANICALS) IMPLANT
TROCAR XCEL NON-BLD 5MMX100MML (ENDOMECHANICALS) ×3 IMPLANT
TUBE CONNECTING 12'X1/4 (SUCTIONS) ×2
TUBE CONNECTING 12X1/4 (SUCTIONS) ×4 IMPLANT
TUBE MIC GASTROSTOMY 16FR (TUBING) ×2 IMPLANT
UNDERPAD 30X30 (UNDERPADS AND DIAPERS) ×3 IMPLANT
WATER STERILE IRR 1000ML POUR (IV SOLUTION) ×3 IMPLANT
YANKAUER SUCT BULB TIP NO VENT (SUCTIONS) ×6 IMPLANT

## 2019-04-28 NOTE — Progress Notes (Signed)
Spoke with pt's husband Rush Landmark. This nurse let him know that pt went down to the OR for procedure. He was upset because he was not informed that she was going today. This nurse told him that schedules were adjusted in the OR, which allowed pt to go today. He stated that he signed consent for Thursday, Oct. 1st, not for today. This nurse told him that consent forms are not signed for specific days, only for consent on the procedure. This nurse also told him that the call was to update and inform him on the change in schedule. He stated that he would be up here soon and thanked this nurse for calling him.

## 2019-04-28 NOTE — Anesthesia Postprocedure Evaluation (Signed)
Anesthesia Post Note  Patient: Joanne Burke  Procedure(s) Performed: EXPLORATORY LAPAROTOMY (N/A Abdomen) SIGMOID COLECTOMY (N/A Abdomen) COLOSTOMY (N/A Abdomen) INSERTION OF GASTROSTOMY TUBE (N/A Abdomen)     Patient location during evaluation: PACU Anesthesia Type: General Level of consciousness: awake Pain management: pain level controlled Vital Signs Assessment: post-procedure vital signs reviewed and stable Respiratory status: spontaneous breathing Cardiovascular status: stable Postop Assessment: no apparent nausea or vomiting Anesthetic complications: no    Last Vitals:  Vitals:   04/28/19 1550 04/28/19 1600  BP: 111/62 (!) 113/57  Pulse: (!) 107   Resp: 14   Temp:  36.9 C  SpO2: 95%     Last Pain:  Vitals:   04/28/19 1600  TempSrc:   PainSc: Asleep                 Barabara Motz

## 2019-04-28 NOTE — Progress Notes (Signed)
Patient ID: Joanne Burke, female   DOB: July 07, 1931, 85 y.o.   MRN: [id number] [facility] Progress Note   Assessment/ Plan:   1.  Acute kidney injury: This appears to be bi-factorial-on presentation likely AKI from sepsis/ATN and exacerbated by contrast exposure.  Improving urine output noted overnight, labs pending from this morning to assess for renal recovery.  Fluids discontinued yesterday due to impending volume overload based on physical exam/chest x-ray findings in the setting of oliguria. 2.  Anion gap metabolic acidosis:  Secondary to AKI.  Status post isotonic sodium bicarbonate-monitor labs from today. 3.  Diverticulitis plus/minus pelvic abscess versus ascites:  With clinical decline, plans noted for exploratory laparotomy today with additional management based on findings. 4.  Hyponatremia: Possibly secondary to acute kidney injury and may indeed have baseline hyponatremia based on labs from earlier this year.  Sodium levels improving due to inadvertent fluid restriction with her current mental status. 5.  Debility/deconditioning 6.  Encephalopathy: Likely acute illness delirium versus effect of recent narcotic use/morphine.  Subjective:   Remains intermittently confused with delirium/delusions.  States that she "wants to talk to her husband because she is not ready to die".   Objective:   BP 139/88 (BP Location: Right Arm)   Pulse (!) 120   Temp 98.2 F (36.8 C) (Axillary)   Resp 16   Ht 5\' 2"  (1.575 m)   Wt 42.9 kg   SpO2 90%   BMI 17.30 kg/m   Intake/Output Summary (Last 24 hours) at 04/28/2019 0949 Last data filed at 04/28/2019 04/30/2019 Gross per 24 hour  Intake 1578.05 ml  Output 1400 ml  Net 178.05 ml   Weight change:   Physical Exam: Gen: Delirious resting in bed, intermittently moaning in pain. CVS: Pulse regular rhythm, normal rate, S1 and S2 normal Resp: Poor inspiratory effort with decreased breath sounds over bases, intermittent wheezing  audible Abd: Soft, flat, mild tenderness over lower quadrants. Ext: No lower extremity edema  Imaging: Ct Abdomen Pelvis Wo Contrast  Result Date: 04/26/2019 CLINICAL DATA:  Abdominal distension. EXAM: CT ABDOMEN AND PELVIS WITHOUT CONTRAST TECHNIQUE: Multidetector CT imaging of the abdomen and pelvis was performed following the standard protocol without IV contrast. COMPARISON:  CT dated 04/25/2019. FINDINGS: Lower chest: There are small bilateral pleural effusions with adjacent atelectasis.The heart size is relatively normal. Aortic calcifications and coronary artery calcifications are noted. Hepatobiliary: The liver is normal. There is hyperdense material within the gallbladder lumen likely representing vicarious excretion of contrast from the prior contrast enhanced examination.There is no biliary ductal dilation. Pancreas: Normal contours without ductal dilatation. No peripancreatic fluid collection. Spleen: No splenic laceration or hematoma. Adrenals/Urinary Tract: --Adrenal glands: No adrenal hemorrhage. --Right kidney/ureter: There is a persistent patchy nephrogram without evidence for hydronephrosis. --Left kidney/ureter: There is a persistent patchy nephrogram without hydronephrosis. --Urinary bladder: Unremarkable. Stomach/Bowel: --Stomach/Duodenum: There is oral contrast in the stomach and esophagus. There is a small to moderate-sized hiatal hernia. --Small bowel: There are dilated loops of small bowel with air-fluid levels measuring up to approximately 4 cm. --Colon: There is scattered colonic diverticula. There appears to be diffuse wall thickening of the sigmoid colon --Appendix: The appendix is not reliably identified. Vascular/Lymphatic: Atherosclerotic calcification is present within the non-aneurysmal abdominal aorta, without hemodynamically significant stenosis. There is likely moderate to severe stenosis of the left common iliac artery secondary to advanced atherosclerotic disease. --No  retroperitoneal lymphadenopathy. --No mesenteric lymphadenopathy. --No pelvic or inguinal lymphadenopathy. Reproductive: Unremarkable Other: Again identified is a  questionable fluid collection in the left hemipelvis with a overall very similar appearance when compared to prior study. Evaluation this collection is limited by both lack of IV and oral contrast. There is a small volume of abdominal ascites. There is fat stranding within the patient's presacral region. There is a trace amount of pelvic free fluid. Musculoskeletal. Again noted is posterior fusion hardware through the visualized lower lumbar spine. There is a subacute fracture involving the sacrum. There are bilateral sacral insufficiency fractures. IMPRESSION: 1. Study is again very limited secondary to extensive streak artifact from the patient's lumbar fusion hardware in addition to a lack of IV contrast. 2. Again noted is a potential fluid collection or abscess in the left hemipelvis, with a similar distribution in size when compared to prior study. This is not well evaluated in the absence of IV contrast. 3. Diffuse wall thickening of the sigmoid colon raises concern for diverticulitis or colitis. 4. Bilateral acute to subacute sacral insufficiency fractures are noted. 5. Small volume abdominal ascites. 6. Patchy persistent bilateral nephrograms concerning for acute tubular necrosis. Correlation with laboratory studies is recommended. 7. Small bilateral pleural effusions with adjacent atelectasis. Electronically Signed   By: Constance Holster M.D.   On: 04/26/2019 11:39   Dg Chest Port 1 View  Result Date: 04/27/2019 CLINICAL DATA:  Shortness of breath, desaturation EXAM: PORTABLE CHEST 1 VIEW COMPARISON:  None. FINDINGS: Patchy bilateral airspace disease noted concerning for pneumonia. Possible small left pleural effusion. Heart is normal size. No acute bony abnormality. Degenerative changes in the thoracic spine and left shoulder. IMPRESSION:  Patchy bilateral airspace opacities concerning for pneumonia. Possible small left effusion. Electronically Signed   By: Rolm Baptise M.D.   On: 04/27/2019 11:18    Labs: BMET Recent Labs  Lab 04/25/19 0355 04/26/19 0324 04/27/19 0240  NA 131* 133* 135  K 3.6 4.3 4.4  CL 98 109 106  CO2 17* 14* 13*  GLUCOSE 99 84 58*  BUN 21 33* 44*  CREATININE 1.02* 2.08* 2.52*  CALCIUM 8.9 7.3* 7.8*  PHOS  --   --  5.4*   CBC Recent Labs  Lab 04/25/19 0355 04/26/19 0324 04/27/19 0240  WBC 7.8 19.2* 20.6*  NEUTROABS  --   --  18.5*  HGB 13.1 11.4* 10.4*  HCT 40.0 33.3* 31.6*  MCV 85.3 83.9 84.5  PLT 166 220 144*    Medications:    . Chlorhexidine Gluconate Cloth  6 each Topical Daily  . ipratropium-albuterol  3 mL Nebulization Q6H      Elmarie Shiley, MD 04/28/2019, 9:49 AM

## 2019-04-28 NOTE — Transfer of Care (Signed)
Immediate Anesthesia Transfer of Care Note  Patient: Joanne Burke  Procedure(s) Performed: EXPLORATORY LAPAROTOMY (N/A Abdomen) SIGMOID COLECTOMY (N/A Abdomen) COLOSTOMY (N/A Abdomen) INSERTION OF GASTROSTOMY TUBE (N/A Abdomen)  Patient Location: PACU  Anesthesia Type:General  Level of Consciousness: drowsy and patient cooperative  Airway & Oxygen Therapy: Patient Spontanous Breathing and Patient connected to face mask oxygen  Post-op Assessment: Report given to RN, Post -op Vital signs reviewed and stable and Patient moving all extremities X 4  Post vital signs: Reviewed and stable  Last Vitals:  Vitals Value Taken Time  BP 144/56 04/28/19 1403  Temp    Pulse 93 04/28/19 1412  Resp 18 04/28/19 1412  SpO2 100 % 04/28/19 1412  Vitals shown include unvalidated device data.  Last Pain:  Vitals:   04/28/19 0756  TempSrc: Axillary  PainSc:       Patients Stated Pain Goal: 0 (09/32/67 1245)  Complications: No apparent anesthesia complications

## 2019-04-28 NOTE — Progress Notes (Signed)
Attempted rectal temp 100.6, pt was non-complaant and moved about during rectal temp. Temp possibility higher then recorded, with another RN's assistance gave Suppository Tylenol

## 2019-04-28 NOTE — Consult Note (Signed)
[place] Nurse ostomy consult note Pt had ileostomy surgery performed today.  Pt is currently in PACU. [place] team will see Friday to begin pouch change and educational sessions.  Julien Girt MSN, RN, [place], [place], [place]

## 2019-04-28 NOTE — Anesthesia Preprocedure Evaluation (Signed)
Anesthesia Evaluation  Patient identified by MRN, date of birth, ID band Patient awake    Reviewed: Allergy & Precautions, NPO status , Patient's Chart, lab work & pertinent test results  Airway Mallampati: II  TM Distance: >3 FB     Dental   Pulmonary Current Smoker,    breath sounds clear to auscultation       Cardiovascular negative cardio ROS   Rhythm:Regular Rate:Normal     Neuro/Psych    GI/Hepatic negative GI ROS, Neg liver ROS,   Endo/Other  negative endocrine ROS  Renal/GU negative Renal ROS     Musculoskeletal   Abdominal   Peds  Hematology negative hematology ROS (+)   Anesthesia Other Findings   Reproductive/Obstetrics                             Anesthesia Physical Anesthesia Plan  ASA: III  Anesthesia Plan: General   Post-op Pain Management:    Induction: Intravenous  PONV Risk Score and Plan: 2 and Ondansetron, Dexamethasone and Midazolam  Airway Management Planned: LMA  Additional Equipment:   Intra-op Plan:   Post-operative Plan:   Informed Consent: I have reviewed the patients History and Physical, chart, labs and discussed the procedure including the risks, benefits and alternatives for the proposed anesthesia with the patient or authorized representative who has indicated his/her understanding and acceptance.     Dental advisory given  Plan Discussed with: CRNA and Anesthesiologist  Anesthesia Plan Comments:         Anesthesia Quick Evaluation

## 2019-04-28 NOTE — Anesthesia Procedure Notes (Signed)
Procedure Name: Intubation Date/Time: 04/28/2019 12:05 PM Performed by: Larene Beach, CRNA Pre-anesthesia Checklist: Patient identified, Emergency Drugs available, Suction available and Patient being monitored Patient Re-evaluated:Patient Re-evaluated prior to induction Oxygen Delivery Method: Circle system utilized Preoxygenation: Pre-oxygenation with 100% oxygen Induction Type: IV induction Ventilation: Mask ventilation without difficulty Laryngoscope Size: Mac and 3 Grade View: Grade I Tube type: Oral Tube size: 7.0 mm Number of attempts: 1 Airway Equipment and Method: Stylet and Oral airway Placement Confirmation: ETT inserted through vocal cords under direct vision,  positive ETCO2 and breath sounds checked- equal and bilateral Secured at: 19 cm Tube secured with: Tape Dental Injury: Teeth and Oropharynx as per pre-operative assessment

## 2019-04-28 NOTE — Progress Notes (Signed)
Pt's axillary temp is 98.2. Will continue to monitor.

## 2019-04-28 NOTE — Progress Notes (Signed)
PROGRESS NOTE    Asa LenteJoann A Bohman  UEA:[id number]:[id number] DOB: 05-Nov-1930 DOA: 04/25/2019 PCP: Patient, No Pcp Per   Brief Narrative:  Patient is a 85 year old female with no significant past medical history who presents with abdominal pain.  She started having left lower quadrant pain about 9 days ago, decreased p.o. intake.  She has not seen a doctor for many years.  It was reported that she was in pretty good health and is ambulatory, quite independent.  When she presented she was hypertensive.  She was found to have acute kidney injury, lactic acidosis, leukocytosis.  CT imaging done in the emergency department showed possible diverticulitis, abscess versus fluid in the abdomen.  Surgery consulted.  Nephrology also following for AKI.  Plan for partial colectomy/colostomy for diverticulitis as per surgery today.  Assessment & Plan:   Principal Problem:   Diverticulitis of colon with perforation Active Problems:   Sepsis (HCC)   Abdominal pain/diverticulitis: Presented with decreased oral intake, left lower quadrant abdominal pain. CT abdomen/pelvis with oral contrast  Showed potential fluid collection or abscess in the left hemipelvis,diffuse wall thickening of the sigmoid colon concerning  for diverticulitis or colitis,small volume abdominal ascites. IR was consulted earlier for possible drainage of the abscess but IR states she is not a candidate for drainage. General surgery following and planning for partial colectomy and colostomy for diverticulitis  . Continue current Abx.Keep her NPO.  AKI : Creatinine was normal in 09/2018.AKI due to underlying abdominal infection, poor oral intake ,possibility of ATN after contrast exposure .CT showed patchy persistent bilateral nephrograms concerning for acute tubular necrosis.  Continue foley.  She has low urine output.Nephrology following.Kidney function has improved today.  Acute respiratory failure with hypoxia:CXR showed atchy bilateral airspace  opacities concerning for pneumonia.  Could be aspiration pneumonia or pulmonary edema .  Continue current antibiotics.  Fluids have been stopped.  Debility/deconditioning: Patient reports that she is quite ambulatory, independent and works on her yard.  Imaging showed bilateral acute to subacute sacral insufficiency fractures .Denied any hip pain. we will request for PT evaluation when appropriate.          DVT prophylaxis: SCD Code Status: Full Family Communication: Discussed with husband at bedside on 04/27/19 Disposition Plan: Undetermined at this point   Consultants: Surgery,nephrology  Procedures:None  Antimicrobials:  Anti-infectives (From admission, onward)   Start     Dose/Rate Route Frequency Ordered Stop   04/27/19 1000  [MAR Hold]  cefTRIAXone (ROCEPHIN) 2 g in sodium chloride 0.9 % 100 mL IVPB     (MAR Hold since Wed 04/28/2019 at 1033.Hold Reason: Transfer to a Procedural area.)   2 g 200 mL/hr over 30 Minutes Intravenous Every 24 hours 04/27/19 0859     04/27/19 1000  [MAR Hold]  metroNIDAZOLE (FLAGYL) IVPB 500 mg     (MAR Hold since Wed 04/28/2019 at 1033.Hold Reason: Transfer to a Procedural area.)   500 mg 100 mL/hr over 60 Minutes Intravenous Every 8 hours 04/27/19 0859     04/26/19 2200  piperacillin-tazobactam (ZOSYN) IVPB 3.375 g  Status:  Discontinued     3.375 g 12.5 mL/hr over 240 Minutes Intravenous Every 12 hours 04/26/19 1253 04/27/19 0858   04/26/19 1800  aztreonam (AZACTAM) 1 g in sodium chloride 0.9 % 100 mL IVPB  Status:  Discontinued     1 g 200 mL/hr over 30 Minutes Intravenous Every 12 hours 04/26/19 0805 04/26/19 1253   04/25/19 2030  ciprofloxacin (CIPRO) IVPB 200 mg  Status:  Discontinued     200 mg 100 mL/hr over 60 Minutes Intravenous Every 12 hours 04/25/19 1109 04/25/19 1640   04/25/19 2030  aztreonam (AZACTAM) 1 g in sodium chloride 0.9 % 100 mL IVPB  Status:  Discontinued     1 g 200 mL/hr over 30 Minutes Intravenous Every 8 hours  04/25/19 1651 04/26/19 0805   04/25/19 1730  aztreonam (AZACTAM) 2 g in sodium chloride 0.9 % 100 mL IVPB  Status:  Discontinued     2 g 200 mL/hr over 30 Minutes Intravenous  Once 04/25/19 1640 04/25/19 1651   04/25/19 1600  metroNIDAZOLE (FLAGYL) IVPB 500 mg  Status:  Discontinued     500 mg 100 mL/hr over 60 Minutes Intravenous Every 8 hours 04/25/19 1109 04/26/19 1253   04/25/19 0730  ciprofloxacin (CIPRO) IVPB 400 mg     400 mg 200 mL/hr over 60 Minutes Intravenous  Once 04/25/19 0729 04/25/19 0921   04/25/19 0730  metroNIDAZOLE (FLAGYL) IVPB 500 mg     500 mg 100 mL/hr over 60 Minutes Intravenous  Once 04/25/19 0729 04/25/19 0924      Subjective:  Patient seen and examined the bedside this morning hemodynamically stable but she was moaning with abdominal discomfort and pain today.  She was also little confused.  She said she wanted to be with her husband.  I discussed with general surgery and they are taking the patient to OR  Objective: Vitals:   04/28/19 0748 04/28/19 0756 04/28/19 0824 04/28/19 0831  BP:    139/88  Pulse:   (!) 114 (!) 120  Resp: Temp:  98.2 F (36.8 C)    TempSrc:  Axillary    SpO2:  96% 96% 90%  Weight:      Height:        Intake/Output Summary (Last 24 hours) at 04/28/2019 1150 Last data filed at 04/28/2019 4098 Gross per 24 hour  Intake 1257.92 ml  Output 1400 ml  Net -142.08 ml   Filed Weights   04/25/19 0336 04/25/19 1438  Weight: 40.8 kg 42.9 kg    Examination:  General exam: Elderly female, generalized weakness, in moderate distress due to abdominal pain  HEENT:Oral mucosa dry Respiratory system: Bilateral decreased air entry, coarse breathing sounds Cardiovascular system: Sinus tachycardia, no JVD, murmurs, rubs, gallops or clicks. Gastrointestinal system: Abdomen is nondistended, soft but has  severe tenderness. Normal bowel sounds heard. Central nervous system: Alert and awake. No focal neurological deficits.  Extremities: No edema, no clubbing ,no cyanosis, distal peripheral pulses palpable. Skin: No rashes, lesions or ulcers,no icterus ,no pallor    Data Reviewed: I have personally reviewed following labs and imaging studies  CBC: Recent Labs  Lab 04/25/19 0355 04/26/19 0324 04/27/19 0240 04/28/19 1007  WBC 7.8 19.2* 20.6* 26.0*  NEUTROABS  --   --  18.5* 24.4*  HGB 13.1 11.4* 10.4* 9.9*  HCT 40.0 33.3* 31.6* 28.7*  MCV 85.3 83.9 84.5 82.7  PLT 166 220 144* 137*   Basic Metabolic Panel: Recent Labs  Lab 04/25/19 0355 04/26/19 0324 04/27/19 0240 04/28/19 1007  NA 131* 133* 135 143  145  K 3.6 4.3 4.4 4.2  4.4  CL 98 109 106 108  109  CO2 17* 14* 13* 22  22  GLUCOSE 99 84 58* 100*  102*  BUN 21 33* 44* 40*  40*  CREATININE 1.02* 2.08* 2.52* 1.80*  1.87*  CALCIUM 8.9 7.3* 7.8* 8.1*  8.1*  MG  --   --  1.7  --   PHOS  --   --  5.4* 3.9   GFR: Estimated Creatinine Clearance: 15.2 mL/min (A) (by C-G formula based on SCr of 1.8 mg/dL (H)). Liver Function Tests: Recent Labs  Lab 04/25/19 0355 04/27/19 0240 04/28/19 1007  AST 46*  --   --   ALT 33  --   --   ALKPHOS 237*  --   --   BILITOT 1.7*  --   --   PROT 6.1*  --   --   ALBUMIN 2.4* 1.6* 1.7*   Recent Labs  Lab 04/25/19 0355  LIPASE 15   No results for input(s): AMMONIA in the last 168 hours. Coagulation Profile: No results for input(s): INR, PROTIME in the last 168 hours. Cardiac Enzymes: No results for input(s): CKTOTAL, CKMB, CKMBINDEX, TROPONINI in the last 168 hours. BNP (last 3 results) No results for input(s): PROBNP in the last 8760 hours. HbA1C: No results for input(s): HGBA1C in the last 72 hours. CBG: Recent Labs  Lab 04/27/19 2115 04/28/19 0007 04/28/19 1100  GLUCAP 60* 181* 95   Lipid Profile: No results for input(s): CHOL, HDL, LDLCALC, TRIG, CHOLHDL, LDLDIRECT in the last 72 hours. Thyroid Function Tests: No results for input(s): TSH, T4TOTAL, FREET4, T3FREE, THYROIDAB in  the last 72 hours. Anemia Panel: No results for input(s): VITAMINB12, FOLATE, FERRITIN, TIBC, IRON, RETICCTPCT in the last 72 hours. Sepsis Labs: Recent Labs  Lab 04/25/19 1700 04/25/19 2021 04/26/19 0810 04/28/19 1007  PROCALCITON 147.34  --   --   --   LATICACIDVEN 2.9* 1.6 1.6 1.8    Recent Results (from the past 240 hour(s))  SARS CORONAVIRUS 2 (TAT 6-24 HRS) Nasopharyngeal Nasopharyngeal Swab     Status: None   Collection Time: 04/25/19 10:57 AM   Specimen: Nasopharyngeal Swab  Result Value Ref Range Status   SARS Coronavirus 2 NEGATIVE NEGATIVE Final    Comment: (NOTE) SARS-CoV-2 target nucleic acids are NOT DETECTED. The SARS-CoV-2 RNA is generally detectable in upper and lower respiratory specimens during the acute phase of infection. Negative results do not preclude SARS-CoV-2 infection, do not rule out co-infections with other pathogens, and should not be used as the sole basis for treatment or other patient management decisions. Negative results must be combined with clinical observations, patient history, and epidemiological information. The expected result is Negative. Fact Sheet for Patients: SugarRoll.be Fact Sheet for Healthcare Providers: https://www.woods-mathews.com/ This test is not yet approved or cleared by the [place] FDA and  has been authorized for detection and/or diagnosis of SARS-CoV-2 by FDA under an Emergency Use Authorization (EUA). This EUA will remain  in effect (meaning this test can be used) for the duration of the COVID-19 declaration under Section 56 4(b)(1) of the Act, 21 U.S.C. section 360bbb-3(b)(1), unless the authorization is terminated or revoked sooner. Performed at [facility], [place] [address] [place], [place] [address]   MRSA PCR Screening     Status: None   Collection Time: 04/25/19  2:48 PM   Specimen: Nasal Mucosa; Nasopharyngeal  Result Value Ref Range Status   MRSA  by PCR NEGATIVE NEGATIVE Final    Comment:        The GeneXpert MRSA Assay (FDA approved for NASAL specimens only), is one component of a comprehensive MRSA colonization surveillance program. It is not intended to diagnose MRSA infection nor to guide or monitor treatment for MRSA infections. Performed at [facility], [place] [address] [place], [place]  [address]   Culture, blood (x 2)     Status: None (Preliminary result)   Collection Time: 04/25/19  5:05 PM   Specimen: BLOOD LEFT HAND  Result Value Ref Range Status   Specimen Description BLOOD LEFT HAND  Final   Special Requests   Final    AEROBIC BOTTLE ONLY Blood Culture results may not be optimal due to an inadequate volume of blood received in culture bottles   Culture   Final    NO GROWTH 3 DAYS Performed at [facility], [address] [place], [place] [address]    Report Status PENDING  Incomplete  Culture, blood (x 2)     Status: None (Preliminary result)   Collection Time: 04/25/19  5:12 PM   Specimen: BLOOD RIGHT HAND  Result Value Ref Range Status   Specimen Description BLOOD RIGHT HAND  Final   Special Requests AEROBIC BOTTLE ONLY Blood Culture adequate volume  Final   Culture   Final    NO GROWTH 3 DAYS Performed at [facility], [address], [place], [place] [address]    Report Status PENDING  Incomplete         Radiology Studies: Dg Chest Port 1 View  Result Date: 04/27/2019 CLINICAL DATA:  Shortness of breath, desaturation EXAM: PORTABLE CHEST 1 VIEW COMPARISON:  None. FINDINGS: Patchy bilateral airspace disease noted concerning for pneumonia. Possible small left pleural effusion. Heart is normal size. No acute bony abnormality. Degenerative changes in the thoracic spine and left shoulder. IMPRESSION: Patchy bilateral airspace opacities concerning for pneumonia. Possible small left effusion. Electronically Signed   By: Charlett Nose M.D.   On: 04/27/2019 11:18        Scheduled  Meds: . [MAR Hold] Chlorhexidine Gluconate Cloth  6 each Topical Daily  . [MAR Hold] ipratropium-albuterol  3 mL Nebulization Q6H   Continuous Infusions: . [MAR Hold] cefTRIAXone (ROCEPHIN)  IV 2 g (04/27/19 0915)  . [MAR Hold] metronidazole 500 mg (04/28/19 0251)     LOS: 3 days    Time spent:35 mins. More than 50% of that time was spent in counseling and/or coordination of care.      Burnadette Pop, MD [facility] Pager [phone]  If 7PM-7AM, please contact night-coverage [email or web address] Password [email or web address] 04/28/2019, 11:50 AM

## 2019-04-28 NOTE — Op Note (Signed)
Preoperative diagnosis: Sigmoid diverticulitis  Postop diagnosis: Severe sigmoid diverticulitis with multiple pelvic abscesses  Procedure: Exporter laparotomy with sigmoid colectomy, colostomy and Stamm gastrostomy tube placement Mobilization of splenic flexure  Surgeon: Harriette Bouillon, MD  Assistant: Rayburn PA  EBL: 200 cc  Drains: 19 round drain to pelvis  Specimen: Transverse, descending and sigmoid colon to pathology.    Indications for procedure patient is a 85-year-old female admitted last week for sigmoid diverticulitis.  Unfortunately, she is failed medical management has become more febrile more ill over the last 48 hours.  We talk with her husband and recommended sigmoid colectomy with colostomy given her advanced age, progression of disease despite maximal medical management and overall frailty.  Discussed complications of bleeding, infection, organ injury, death, DVT, and up to a 30% risk of  complications and a 5 to 7% risk of mortality. The procedure has been discussed with the patient.  Alternative therapies have been discussed with the patient.  Operative risks include bleeding,  Infection,  Organ injury,  Nerve injury,  Blood vessel injury,  DVT,  Pulmonary embolism,  Death,  And possible reoperation.  Medical management risks include worsening of present situation.  The success of the procedure is 50 -90 % at treating patients symptoms.  The patient understands and agrees to proceed.    Description of procedure: The patient was brought from the holding area to the operative room.  She was placed supine upon the OR table.  After induction of general esthesia, a Foley catheter was placed under sterile conditions.  Her abdomen was prepped and draped in sterile fashion timeout was performed.  She was already on preoperative antibiotics.  A midline incision was used.  Dissection was carried to the linea alba this was opened in the midline.  She had some lower nominal adhesions  taken down with the cautery.  Retractors were placed.  She had significant severe inflammation of her sigmoid colon down to the rectum.  She also had disease involved her descending colon and splenic flexure up to the distal transverse colon.  This was carefully mobilized along the white line of Toldt.  The ureters identified in left and preserved.  This was severely inflamed.  We divided the transverse colon with a G I a 75 stapler.  I then mobilized the omentum off the colon.  We then mobilized the splenic flexure and the descending colon down to the distal sigmoid colon.  She had dense inflammatory change which was difficult to dissect through.  She had multiple pelvic abscesses as well.  These were irrigated out with copious irrigation.  I transected the distal sigmoid colon right the pelvic brim.  This was passed off the field.  5 L of irrigation were used and suctioned out until clear.  Through separate stab incision a 19 round Blake drain was placed in the pelvis.  This secured the skin with 2-0 nylon.  We created a Stamm gastrostomy tube using a 16 French gastrostomy tube.  Through a separate stab incision the patient's left upper quadrant this was inserted.  Pursestring suture in the anterior gastric wall of 2-0 Vicryl was placed.  Gastrotomy was performed.  Gastrostomy tube was placed in the  stomach and the balloon was inflated.  This was secured to the skin with 2-0 nylon.  Stomach was secured to the undersurface of the fascia with 2-0 Vicryl. Right upper quadrant colostomy  was brought up through the abdominal wall.  The fascia was then closed with double-stranded PDS.  Skin packed open.  Colostomy in the right upper quadrant was matured with 3-0 Vicryl.  Wet-to-dry dressings placed.  All counts were found to be correct at this portion the case.  The patient was extubated taken recovery in stable condition.

## 2019-04-28 NOTE — Progress Notes (Signed)
CBG 181 now   AuxiliaryTemp 103.3  Pt refuses to allow any pt care. Combative when attempts are made to do any task. Screams, yells, grabs, hits, and pulls away when touched or awaken. Dr. Curly Rim, unable to give Suppository Tylenol.

## 2019-04-28 NOTE — Progress Notes (Signed)
[facility] Progress Note     Subjective: CC-  Patient tachycardic and febrile over night. Continues to have severe abdominal pain. Confused this morning.  Objective: Vital signs in last 24 hours: Temp:  [98.2 F (36.8 C)-103.3 F (39.6 C)] 98.2 F (36.8 C) (09/30 0756) Pulse Rate:  [37-120] 120 (09/30 0831) Resp:  [9-20] 16 (09/30 0831) BP: (104-141)/(57-88) 139/88 (09/30 0831) SpO2:  [90 %-100 %] 90 % (09/30 0831) FiO2 (%):  [21 %] 21 % (09/29 1546) Last BM Date: (pta)  Intake/Output from previous day: 09/29 0701 - 09/30 0700 In: 1580.9 [I.V.:748.2; IV Piggyback:832.7] Out: 1100 [Urine:1100] Intake/Output this shift: No intake/output data recorded.  PE: Gen: Alert but confused, NAD HEENT: EOM's intact, pupils equal and round Card:tachy Pulm:Rate andeffort normal ZOX:WRUEAbd:well healed midline and RLQincisions,mostlysoft,mild distension,diffusely tender Skin: no rashes noted, warm and dry  Lab Results:  Recent Labs    04/26/19 0324 04/27/19 0240  WBC 19.2* 20.6*  HGB 11.4* 10.4*  HCT 33.3* 31.6*  PLT 220 144*   BMET Recent Labs    04/26/19 0324 04/27/19 0240  NA 133* 135  K 4.3 4.4  CL 109 106  CO2 14* 13*  GLUCOSE 84 58*  BUN 33* 44*  CREATININE 2.08* 2.52*  CALCIUM 7.3* 7.8*   PT/INR No results for input(s): LABPROT, INR in the last 72 hours. CMP     Component Value Date/Time   NA 135 04/27/2019 0240   K 4.4 04/27/2019 0240   CL 106 04/27/2019 0240   CO2 13 (L) 04/27/2019 0240   GLUCOSE 58 (L) 04/27/2019 0240   BUN 44 (H) 04/27/2019 0240   CREATININE 2.52 (H) 04/27/2019 0240   CALCIUM 7.8 (L) 04/27/2019 0240   PROT 6.1 (L) 04/25/2019 0355   ALBUMIN 1.6 (L) 04/27/2019 0240   AST 46 (H) 04/25/2019 0355   ALT 33 04/25/2019 0355   ALKPHOS 237 (H) 04/25/2019 0355   BILITOT 1.7 (H) 04/25/2019 0355   GFRNONAA 17 (L) 04/27/2019 0240   GFRAA 19 (L) 04/27/2019 0240   Lipase     Component Value Date/Time   LIPASE 15  04/25/2019 0355       Studies/Results: Ct Abdomen Pelvis Wo Contrast  Result Date: 04/26/2019 CLINICAL DATA:  Abdominal distension. EXAM: CT ABDOMEN AND PELVIS WITHOUT CONTRAST TECHNIQUE: Multidetector CT imaging of the abdomen and pelvis was performed following the standard protocol without IV contrast. COMPARISON:  CT dated 04/25/2019. FINDINGS: Lower chest: There are small bilateral pleural effusions with adjacent atelectasis.The heart size is relatively normal. Aortic calcifications and coronary artery calcifications are noted. Hepatobiliary: The liver is normal. There is hyperdense material within the gallbladder lumen likely representing vicarious excretion of contrast from the prior contrast enhanced examination.There is no biliary ductal dilation. Pancreas: Normal contours without ductal dilatation. No peripancreatic fluid collection. Spleen: No splenic laceration or hematoma. Adrenals/Urinary Tract: --Adrenal glands: No adrenal hemorrhage. --Right kidney/ureter: There is a persistent patchy nephrogram without evidence for hydronephrosis. --Left kidney/ureter: There is a persistent patchy nephrogram without hydronephrosis. --Urinary bladder: Unremarkable. Stomach/Bowel: --Stomach/Duodenum: There is oral contrast in the stomach and esophagus. There is a small to moderate-sized hiatal hernia. --Small bowel: There are dilated loops of small bowel with air-fluid levels measuring up to approximately 4 cm. --Colon: There is scattered colonic diverticula. There appears to be diffuse wall thickening of the sigmoid colon --Appendix: The appendix is not reliably identified. Vascular/Lymphatic: Atherosclerotic calcification is present within the non-aneurysmal abdominal aorta, without hemodynamically significant stenosis. There is likely moderate  to severe stenosis of the left common iliac artery secondary to advanced atherosclerotic disease. --No retroperitoneal lymphadenopathy. --No mesenteric  lymphadenopathy. --No pelvic or inguinal lymphadenopathy. Reproductive: Unremarkable Other: Again identified is a questionable fluid collection in the left hemipelvis with a overall very similar appearance when compared to prior study. Evaluation this collection is limited by both lack of IV and oral contrast. There is a small volume of abdominal ascites. There is fat stranding within the patient's presacral region. There is a trace amount of pelvic free fluid. Musculoskeletal. Again noted is posterior fusion hardware through the visualized lower lumbar spine. There is a subacute fracture involving the sacrum. There are bilateral sacral insufficiency fractures. IMPRESSION: 1. Study is again very limited secondary to extensive streak artifact from the patient's lumbar fusion hardware in addition to a lack of IV contrast. 2. Again noted is a potential fluid collection or abscess in the left hemipelvis, with a similar distribution in size when compared to prior study. This is not well evaluated in the absence of IV contrast. 3. Diffuse wall thickening of the sigmoid colon raises concern for diverticulitis or colitis. 4. Bilateral acute to subacute sacral insufficiency fractures are noted. 5. Small volume abdominal ascites. 6. Patchy persistent bilateral nephrograms concerning for acute tubular necrosis. Correlation with laboratory studies is recommended. 7. Small bilateral pleural effusions with adjacent atelectasis. Electronically Signed   By: Constance Holster M.D.   On: 04/26/2019 11:39   Dg Chest Port 1 View  Result Date: 04/27/2019 CLINICAL DATA:  Shortness of breath, desaturation EXAM: PORTABLE CHEST 1 VIEW COMPARISON:  None. FINDINGS: Patchy bilateral airspace disease noted concerning for pneumonia. Possible small left pleural effusion. Heart is normal size. No acute bony abnormality. Degenerative changes in the thoracic spine and left shoulder. IMPRESSION: Patchy bilateral airspace opacities concerning for  pneumonia. Possible small left effusion. Electronically Signed   By: Rolm Baptise M.D.   On: 04/27/2019 11:18    Anti-infectives: Anti-infectives (From admission, onward)   Start     Dose/Rate Route Frequency Ordered Stop   04/27/19 1000  cefTRIAXone (ROCEPHIN) 2 g in sodium chloride 0.9 % 100 mL IVPB     2 g 200 mL/hr over 30 Minutes Intravenous Every 24 hours 04/27/19 0859     04/27/19 1000  metroNIDAZOLE (FLAGYL) IVPB 500 mg     500 mg 100 mL/hr over 60 Minutes Intravenous Every 8 hours 04/27/19 0859     04/26/19 2200  piperacillin-tazobactam (ZOSYN) IVPB 3.375 g  Status:  Discontinued     3.375 g 12.5 mL/hr over 240 Minutes Intravenous Every 12 hours 04/26/19 1253 04/27/19 0858   04/26/19 1800  aztreonam (AZACTAM) 1 g in sodium chloride 0.9 % 100 mL IVPB  Status:  Discontinued     1 g 200 mL/hr over 30 Minutes Intravenous Every 12 hours 04/26/19 0805 04/26/19 1253   04/25/19 2030  ciprofloxacin (CIPRO) IVPB 200 mg  Status:  Discontinued     200 mg 100 mL/hr over 60 Minutes Intravenous Every 12 hours 04/25/19 1109 04/25/19 1640   04/25/19 2030  aztreonam (AZACTAM) 1 g in sodium chloride 0.9 % 100 mL IVPB  Status:  Discontinued     1 g 200 mL/hr over 30 Minutes Intravenous Every 8 hours 04/25/19 1651 04/26/19 0805   04/25/19 1730  aztreonam (AZACTAM) 2 g in sodium chloride 0.9 % 100 mL IVPB  Status:  Discontinued     2 g 200 mL/hr over 30 Minutes Intravenous  Once 04/25/19 1640 04/25/19 1651  04/25/19 1600  metroNIDAZOLE (FLAGYL) IVPB 500 mg  Status:  Discontinued     500 mg 100 mL/hr over 60 Minutes Intravenous Every 8 hours 04/25/19 1109 04/26/19 1253   04/25/19 0730  ciprofloxacin (CIPRO) IVPB 400 mg     400 mg 200 mL/hr over 60 Minutes Intravenous  Once 04/25/19 0729 04/25/19 0921   04/25/19 0730  metroNIDAZOLE (FLAGYL) IVPB 500 mg     500 mg 100 mL/hr over 60 Minutes Intravenous  Once 04/25/19 0729 04/25/19 0086       Assessment/Plan Constipation Tobacco abuse AKI-  Cr 2.52 (9/30), nephrology following  Pelvic fluid collection Suspect perforated diverticulitis - 1 week of worsening lower abdominal pain, 1 prior episode in early summer that resolved without intervention - she has never had a colonoscopy - CTA9/27showed11.4 x 2.4 x 2.1 cm collection of fluid and gas in theleft pelvis that appears extraluminal with rim enhancement, etiology difficult to discern but could be diverticular disease or possible colonic neoplasm - IRstates there is currently nothing to drain. ?possible ascites in pelvis. - CEA 4.2  ID -cipro9/27 x1,flagyl9/27>> , aztreonam 9/27>>9/28, zosyn 9/28>>9/29, rocephin 9/29>> VTE -SCDs FEN -IVF, NPO Foley - foley Follow up -TBD  Plan: Labs pending.  Spoke with patient's husband yesterday afternoon regarding surgery, he agrees with proceeding. He understands that she is high risk for surgery including infection, prolonged intubation, requirement for multiple procedures, worsening of medical problems, death, DVT and pulmonary embolism, and cardiovascular events. Will plan for surgery today for exploratory laparotomy, sigmoid colectomy, colostomy, and gastrostomy tube placement.    LOS: 3 days    Franne Forts , [facility] 04/28/2019, 8:39 AM Pager: [phone] Mon-Thurs 7:00 am-4:30 pm Fri 7:00 am -11:30 AM Sat-Sun 7:00 am-11:30 am

## 2019-04-29 ENCOUNTER — Encounter (HOSPITAL_COMMUNITY): Payer: Self-pay | Admitting: Surgery

## 2019-04-29 LAB — CBC WITH DIFFERENTIAL/PLATELET
Abs Immature Granulocytes: 2.32 10*3/uL — ABNORMAL HIGH (ref 0.00–0.07)
Basophils Absolute: 0.2 10*3/uL — ABNORMAL HIGH (ref 0.0–0.1)
Basophils Relative: 1 %
Eosinophils Absolute: 0 10*3/uL (ref 0.0–0.5)
Eosinophils Relative: 0 %
HCT: 26.8 % — ABNORMAL LOW (ref 36.0–46.0)
Hemoglobin: 9.4 g/dL — ABNORMAL LOW (ref 12.0–15.0)
Immature Granulocytes: 10 %
Lymphocytes Relative: 5 %
Lymphs Abs: 1 10*3/uL (ref 0.7–4.0)
MCH: 30.3 pg (ref 26.0–34.0)
MCHC: 35.1 g/dL (ref 30.0–36.0)
MCV: 86.5 fL (ref 80.0–100.0)
Monocytes Absolute: 0.7 10*3/uL (ref 0.1–1.0)
Monocytes Relative: 3 %
Neutro Abs: 18.8 10*3/uL — ABNORMAL HIGH (ref 1.7–7.7)
Neutrophils Relative %: 81 %
Platelets: 95 10*3/uL — ABNORMAL LOW (ref 150–400)
RBC: 3.1 MIL/uL — ABNORMAL LOW (ref 3.87–5.11)
RDW: 15.4 % (ref 11.5–15.5)
WBC: 23 10*3/uL — ABNORMAL HIGH (ref 4.0–10.5)
nRBC: 0.4 % — ABNORMAL HIGH (ref 0.0–0.2)

## 2019-04-29 LAB — PREPARE FRESH FROZEN PLASMA

## 2019-04-29 LAB — RENAL FUNCTION PANEL
Albumin: 1.8 g/dL — ABNORMAL LOW (ref 3.5–5.0)
Anion gap: 12 (ref 5–15)
BUN: 54 mg/dL — ABNORMAL HIGH (ref 8–23)
CO2: 17 mmol/L — ABNORMAL LOW (ref 22–32)
Calcium: 7.1 mg/dL — ABNORMAL LOW (ref 8.9–10.3)
Chloride: 115 mmol/L — ABNORMAL HIGH (ref 98–111)
Creatinine, Ser: 2.08 mg/dL — ABNORMAL HIGH (ref 0.44–1.00)
GFR calc Af Amer: 24 mL/min — ABNORMAL LOW (ref >60)
GFR calc non Af Amer: 21 mL/min — ABNORMAL LOW (ref >60)
Glucose, Bld: 139 mg/dL — ABNORMAL HIGH (ref 70–99)
Phosphorus: 5.4 mg/dL — ABNORMAL HIGH (ref 2.5–4.6)
Potassium: 4.2 mmol/L (ref 3.5–5.1)
Sodium: 144 mmol/L (ref 135–145)

## 2019-04-29 LAB — BPAM FFP
Blood Product Expiration Date: 202010052359
ISSUE DATE / TIME: 202009302352
Unit Type and Rh: 6200

## 2019-04-29 MED ORDER — PIPERACILLIN-TAZOBACTAM IN DEX 2-0.25 GM/50ML IV SOLN
2.2500 g | Freq: Three times a day (TID) | INTRAVENOUS | Status: DC
  Administered 2019-04-29 – 2019-05-04 (×15): 2.25 g via INTRAVENOUS
  Filled 2019-04-29 (×18): qty 50

## 2019-04-29 MED ORDER — DEXTROSE-NACL 5-0.45 % IV SOLN
INTRAVENOUS | Status: AC
  Administered 2019-04-29: 11:00:00 990 mL via INTRAVENOUS
  Administered 2019-04-30: via INTRAVENOUS

## 2019-04-29 MED ORDER — ACETAMINOPHEN 10 MG/ML IV SOLN
1000.0000 mg | Freq: Four times a day (QID) | INTRAVENOUS | Status: AC
  Administered 2019-04-29 – 2019-04-30 (×4): 1000 mg via INTRAVENOUS
  Filled 2019-04-29 (×5): qty 100

## 2019-04-29 NOTE — Progress Notes (Signed)
Patient ID: Joanne Burke, female   DOB: 31-Dec-1930, 84 y.o.   MRN: [id number]    1 Day Post-Op  Subjective: Moaning, appears uncomfortable, pulling arms and legs in towards herself.  Will follow commands such as squeezing my hand, but does not otherwise interact or speak at all.  Objective: Vital signs in last 24 hours: Temp:  [96.8 F (36 C)-100.6 F (38.1 C)] 98.7 F (37.1 C) (10/01 0751) Pulse Rate:  [76-114] 99 (10/01 0751) Resp:  [11-23] 14 (10/01 0751) BP: (95-132)/(50-112) 123/65 (10/01 0751) SpO2:  [89 %-100 %] 91 % (10/01 0847) FiO2 (%):  [2 %] 2 % (09/30 2049) Last BM Date: (PTA)  Intake/Output from previous day: 09/30 0701 - 10/01 0700 In: 2466.4 [I.V.:900; Blood:930.3; IV Piggyback:636.1] Out: 1250 [Urine:650; Drains:450; Blood:150] Intake/Output this shift: Total I/O In: -  Out: 75 [Drains:75]  PE: HEENT: PERRL Heart: tachy Lungs: CTAB, on O2 at 2L, sats between 85-93% Abd: very tender, moans the whole during examination.  Midline wound is clean and minimal oozing.  JP drain still with bloody output, g-tube in place.  Colostomy in place and stoma is viable, no output yet. Ext: wrists in restraints, and pulls arms and legs in towards her body at times.   Lab Results:  Recent Labs    04/28/19 1007 04/28/19 1734 04/29/19 0707  WBC 26.0*  --  23.0*  HGB 9.9* 7.6* 9.4*  HCT 28.7* 21.7* 26.8*  PLT 137*  --  95*   BMET Recent Labs    04/28/19 1007 04/29/19 0707  NA 143  145 144  K 4.2  4.4 4.2  CL 108  109 115*  CO2 22  22 17*  GLUCOSE 100*  102* 139*  BUN 40*  40* 54*  CREATININE 1.80*  1.87* 2.08*  CALCIUM 8.1*  8.1* 7.1*   PT/INR Recent Labs    04/28/19 1734  LABPROT 17.5*  INR 1.5*   CMP     Component Value Date/Time   NA 144 04/29/2019 0707   K 4.2 04/29/2019 0707   CL 115 (H) 04/29/2019 0707   CO2 17 (L) 04/29/2019 0707   GLUCOSE 139 (H) 04/29/2019 0707   BUN 54 (H) 04/29/2019 0707   CREATININE 2.08 (H) 04/29/2019  0707   CALCIUM 7.1 (L) 04/29/2019 0707   PROT 6.1 (L) 04/25/2019 0355   ALBUMIN 1.8 (L) 04/29/2019 0707   AST 46 (H) 04/25/2019 0355   ALT 33 04/25/2019 0355   ALKPHOS 237 (H) 04/25/2019 0355   BILITOT 1.7 (H) 04/25/2019 0355   GFRNONAA 21 (L) 04/29/2019 0707   GFRAA 24 (L) 04/29/2019 0707   Lipase     Component Value Date/Time   LIPASE 15 04/25/2019 0355       Studies/Results: Dg Chest Port 1 View  Result Date: 04/27/2019 CLINICAL DATA:  Shortness of breath, desaturation EXAM: PORTABLE CHEST 1 VIEW COMPARISON:  None. FINDINGS: Patchy bilateral airspace disease noted concerning for pneumonia. Possible small left pleural effusion. Heart is normal size. No acute bony abnormality. Degenerative changes in the thoracic spine and left shoulder. IMPRESSION: Patchy bilateral airspace opacities concerning for pneumonia. Possible small left effusion. Electronically Signed   By: Rolm Baptise M.D.   On: 04/27/2019 11:18    Anti-infectives: Anti-infectives (From admission, onward)   Start     Dose/Rate Route Frequency Ordered Stop   04/27/19 1000  cefTRIAXone (ROCEPHIN) 2 g in sodium chloride 0.9 % 100 mL IVPB     2 g 200 mL/hr over  30 Minutes Intravenous Every 24 hours 04/27/19 0859     04/27/19 1000  metroNIDAZOLE (FLAGYL) IVPB 500 mg     500 mg 100 mL/hr over 60 Minutes Intravenous Every 8 hours 04/27/19 0859     04/26/19 2200  piperacillin-tazobactam (ZOSYN) IVPB 3.375 g  Status:  Discontinued     3.375 g 12.5 mL/hr over 240 Minutes Intravenous Every 12 hours 04/26/19 1253 04/27/19 0858   04/26/19 1800  aztreonam (AZACTAM) 1 g in sodium chloride 0.9 % 100 mL IVPB  Status:  Discontinued     1 g 200 mL/hr over 30 Minutes Intravenous Every 12 hours 04/26/19 0805 04/26/19 1253   04/25/19 2030  ciprofloxacin (CIPRO) IVPB 200 mg  Status:  Discontinued     200 mg 100 mL/hr over 60 Minutes Intravenous Every 12 hours 04/25/19 1109 04/25/19 1640   04/25/19 2030  aztreonam (AZACTAM) 1 g in  sodium chloride 0.9 % 100 mL IVPB  Status:  Discontinued     1 g 200 mL/hr over 30 Minutes Intravenous Every 8 hours 04/25/19 1651 04/26/19 0805   04/25/19 1730  aztreonam (AZACTAM) 2 g in sodium chloride 0.9 % 100 mL IVPB  Status:  Discontinued     2 g 200 mL/hr over 30 Minutes Intravenous  Once 04/25/19 1640 04/25/19 1651   04/25/19 1600  metroNIDAZOLE (FLAGYL) IVPB 500 mg  Status:  Discontinued     500 mg 100 mL/hr over 60 Minutes Intravenous Every 8 hours 04/25/19 1109 04/26/19 1253   04/25/19 0730  ciprofloxacin (CIPRO) IVPB 400 mg     400 mg 200 mL/hr over 60 Minutes Intravenous  Once 04/25/19 0729 04/25/19 0921   04/25/19 0730  metroNIDAZOLE (FLAGYL) IVPB 500 mg     500 mg 100 mL/hr over 60 Minutes Intravenous  Once 04/25/19 0729 04/25/19 2671       Assessment/Plan Constipation Tobacco abuse AKI-Cr 2.08, nephrology following  POD 1, s/p ex lap with sigmoid colectomy, colostomy, and Stamm gastrostomy tube placement, Dr. Luisa Hart 9/30 for expected severe diverticulitis with multiple pelvic abscesses - await pathology -patient does not look good today.  She is moaning and is not in her usual mental state.  Her vitals are stable.  Have d/w medicine.  Watchful waiting for now -minimize narcotics to avoid over sedation.  Will add IV tylenol to help with pain as patient seem very uncomfortable -continue to monitor hgb.  Received 2 pRBC and 1 FFP given yesterday after surgery.  hgb stable today, but will continue to follow closely as outputs still on the bloody side. -abdominal binder over abdomen to minimize risk of pulling g-tube output or any other drains. -BID WD dressing changes to midline wound -routine JP drain care -would hold on starting trickle TFs today given how poor patient appears and how much pain she seems to have. -WBC down to 23, but given significant purulent infection, will transition back to zosyn instead of Rocephin/Flagyl for better coverage  ID  -cipro9/27 x1,flagyl9/27>> 10/1, aztreonam 9/27>>9/28, zosyn 9/28>>9/29, rocephin 9/29>>10/1; zosyn10/1 --> VTE -SCDs FEN -IVF, NPO Foley - foley Follow up -TBD   LOS: 4 days    Letha Cape , [facility] 04/29/2019, 10:46 AM Pager: [phone]

## 2019-04-29 NOTE — Progress Notes (Signed)
Patient ID: Joanne Burke, female   DOB: 03/18/1931, 84 y.o.   MRN: [id number] [facility] Progress Note   Assessment/ Plan:   1.  Acute kidney injury: This appears to be bi-factorial-on presentation likely AKI from sepsis/ATN and exacerbated by contrast exposure.  Improving renal function noted overnight but likely affected by hemodynamic impact from surgery/blood loss with higher creatinine noted today.  Regardless, she is nonoliguric and without any acute electrolyte abnormalities prompting consideration of RRT. 2.  Anion gap metabolic acidosis:  Secondary to AKI.  Status post isotonic sodium bicarbonate-monitor labs from today. 3.    Severe sigmoid diverticulitis with multiple pelvic abscesses:  After failing attempted medical management, she underwent exploratory laparotomy yesterday with sigmoid colectomy, colostomy and gastrostomy tube placement.  Thereafter required PRBC/plasma infusions.. 4.  Hypernatremia: Due to limited intake and insensible losses.  Begin D5W. 5.  Debility/deconditioning 6.  Encephalopathy: Likely acute illness delirium versus effect of recent narcotic use/morphine.  Subjective:   Overnight events noted including surgical note and transfusions.   Objective:   BP 123/65 (BP Location: Right Arm)   Pulse 99   Temp 98.7 F (37.1 C) (Axillary)   Resp 14   Ht 5\' 2"  (1.575 m)   Wt 42.9 kg   SpO2 91%   BMI 17.30 kg/m   Intake/Output Summary (Last 24 hours) at 04/29/2019 0948 Last data filed at 04/29/2019 0530 Gross per 24 hour  Intake 2466.41 ml  Output 950 ml  Net 1516.41 ml   Weight change:   Physical Exam: Gen: Somnolent, difficult to arouse CVS: Pulse regular rhythm, normal rate, S1 and S2 normal Resp: Poor inspiratory effort with decreased breath sounds over bases, intermittent wheezing audible Abd: Colostomy bag in situ, gastrostomy tube in situ.  Ongoing dressing change of abdominal wound Ext: No lower extremity edema  Imaging: Dg  Chest Port 1 View  Result Date: 04/27/2019 CLINICAL DATA:  Shortness of breath, desaturation EXAM: PORTABLE CHEST 1 VIEW COMPARISON:  None. FINDINGS: Patchy bilateral airspace disease noted concerning for pneumonia. Possible small left pleural effusion. Heart is normal size. No acute bony abnormality. Degenerative changes in the thoracic spine and left shoulder. IMPRESSION: Patchy bilateral airspace opacities concerning for pneumonia. Possible small left effusion. Electronically Signed   By: Rolm Baptise M.D.   On: 04/27/2019 11:18    Labs: BMET Recent Labs  Lab 04/25/19 0355 04/26/19 0324 04/27/19 0240 04/28/19 1007 04/29/19 0707  NA 131* 133* 135 143  145 144  K 3.6 4.3 4.4 4.2  4.4 4.2  CL 98 109 106 108  109 115*  CO2 17* 14* 13* 22  22 17*  GLUCOSE 99 84 58* 100*  102* 139*  BUN 21 33* 44* 40*  40* 54*  CREATININE 1.02* 2.08* 2.52* 1.80*  1.87* 2.08*  CALCIUM 8.9 7.3* 7.8* 8.1*  8.1* 7.1*  PHOS  --   --  5.4* 3.9 5.4*   CBC Recent Labs  Lab 04/26/19 0324 04/27/19 0240 04/28/19 1007 04/28/19 1734 04/29/19 0707  WBC 19.2* 20.6* 26.0*  --  23.0*  NEUTROABS  --  18.5* 24.4*  --  18.8*  HGB 11.4* 10.4* 9.9* 7.6* 9.4*  HCT 33.3* 31.6* 28.7* 21.7* 26.8*  MCV 83.9 84.5 82.7  --  86.5  PLT 220 144* 137*  --  95*    Medications:    . sodium chloride   Intravenous Once  . Chlorhexidine Gluconate Cloth  6 each Topical Daily  . ipratropium-albuterol  3 mL Nebulization  BID   Zetta Bills, MD 04/29/2019, 9:48 AM

## 2019-04-29 NOTE — Progress Notes (Signed)
Pt was ordered to have Plasma & 2 units of RBC to be infused on the shift 7a-7p. Once all of the above was ready, it was found that the consent had not been signed. The pt's husband was called 3 times at the number provided, husband was unable to be reached. The granddaughter was contacted and verbal consent was giving in order to infused the order items.

## 2019-04-29 NOTE — Progress Notes (Signed)
Patient's husband wants to speak with MD regarding wife's condition s/p surgery.  MD paged.  Awaiting response.

## 2019-04-29 NOTE — Progress Notes (Signed)
PROGRESS NOTE    Joanne Burke  ZOX:[id number] DOB: 10-13-1930 DOA: 04/25/2019 PCP: Patient, No Pcp Per   Brief Narrative:  Patient is a 85 year old female with no significant past medical history who presents with abdominal pain.  She started having left lower quadrant pain about 9 days ago, decreased p.o. intake.  She has not seen a doctor for many years.  It was reported that she was in pretty good health and is ambulatory, quite independent.  When she presented she was hypertensive.  She was found to have acute kidney injury, lactic acidosis, leukocytosis.  CT imaging done in the emergency department showed possible diverticulitis, abscess versus fluid in the abdomen.  Surgery consulted.  Nephrology also following for AKI.  Plan for partial colectomy/colostomy for diverticulitis as per surgery today.  Assessment & Plan:   Principal Problem:   Diverticulitis of colon with perforation Active Problems:   Sepsis (HCC)   Abdominal pain/diverticulitis with multiple pelvic abscesses, status post exploratory laparotomy with sigmoid colectomy, colostomy and gastrostomy tube placement with mobilization of splenic flexure (POD 1): Presented with decreased oral intake, left lower quadrant abdominal pain. CT abdomen/pelvis with oral contrast  Showed potential fluid collection or abscess in the left hemipelvis,diffuse wall thickening of the sigmoid colon concerning  for diverticulitis or colitis,small volume abdominal ascites. IR was consulted earlier for possible drainage of the abscess but IR states she is not a candidate for drainage. Antibiotics: cipro9/27 x1,flagyl9/27>> 10/1, aztreonam 9/27>>9/28, zosyn 9/28>>9/29, rocephin 9/29>>10/1; zosyn10/1 --> currently.  Continue antibiotics Appears to be in pain with moaning, current pain regimen: IV Tylenol every 6 hours, morphine 1 to 2 mg every 2 hours as needed for severe pain.  Tina monitor  AKI : Improving.  Creatinine was normal in 09/2018.AKI  due to underlying abdominal infection, poor oral intake ,possibility of ATN after contrast exposure setting of hemodynamic impact.CT showed patchy persistent bilateral nephrograms concerning for acute tubular necrosis.  Continue foley.  Nephrology on board.  Continue to monitor  Anion gap metabolic acidosis secondary to AKI post isotonic sodium bicarb.  Continue to monitor  Hypernatremia started on D5W per nephrology due to limited intake and insensible losses.  Acute respiratory failure with hypoxia:CXR showed atchy bilateral airspace opacities concerning for pneumonia.  Could be aspiration pneumonia or pulmonary edema .  Continue current antibiotics.  Fluids have been stopped.  Debility/deconditioning: Spouse reports that she is quite ambulatory, independent and works on her yard.  Imaging showed bilateral acute to subacute sacral insufficiency fractures .Denied any hip pain. we will request for PT evaluation when appropriate.  Encephalopathy likely multifactorial suspect narcotic medication induced in setting of recent abdominal surgery and pain,  in elderly patient.  Continue antibiotics and restrict any medications that can alter mental status as best possible.         DVT prophylaxis: SCD Code Status: Full Family Communication: Discussed with husband at bedside today Disposition Plan: Undetermined at this point   Consultants: Surgery,nephrology  Procedures:None  Antimicrobials:  Anti-infectives (From admission, onward)   Start     Dose/Rate Route Frequency Ordered Stop   04/29/19 1400  piperacillin-tazobactam (ZOSYN) IVPB 2.25 g     2.25 g 100 mL/hr over 30 Minutes Intravenous Every 8 hours 04/29/19 1101     04/27/19 1000  cefTRIAXone (ROCEPHIN) 2 g in sodium chloride 0.9 % 100 mL IVPB  Status:  Discontinued     2 g 200 mL/hr over 30 Minutes Intravenous Every 24 hours 04/27/19 0859 04/29/19 1101  04/27/19 1000  metroNIDAZOLE (FLAGYL) IVPB 500 mg  Status:  Discontinued      500 mg 100 mL/hr over 60 Minutes Intravenous Every 8 hours 04/27/19 0859 04/29/19 1101   04/26/19 2200  piperacillin-tazobactam (ZOSYN) IVPB 3.375 g  Status:  Discontinued     3.375 g 12.5 mL/hr over 240 Minutes Intravenous Every 12 hours 04/26/19 1253 04/27/19 0858   04/26/19 1800  aztreonam (AZACTAM) 1 g in sodium chloride 0.9 % 100 mL IVPB  Status:  Discontinued     1 g 200 mL/hr over 30 Minutes Intravenous Every 12 hours 04/26/19 0805 04/26/19 1253   04/25/19 2030  ciprofloxacin (CIPRO) IVPB 200 mg  Status:  Discontinued     200 mg 100 mL/hr over 60 Minutes Intravenous Every 12 hours 04/25/19 1109 04/25/19 1640   04/25/19 2030  aztreonam (AZACTAM) 1 g in sodium chloride 0.9 % 100 mL IVPB  Status:  Discontinued     1 g 200 mL/hr over 30 Minutes Intravenous Every 8 hours 04/25/19 1651 04/26/19 0805   04/25/19 1730  aztreonam (AZACTAM) 2 g in sodium chloride 0.9 % 100 mL IVPB  Status:  Discontinued     2 g 200 mL/hr over 30 Minutes Intravenous  Once 04/25/19 1640 04/25/19 1651   04/25/19 1600  metroNIDAZOLE (FLAGYL) IVPB 500 mg  Status:  Discontinued     500 mg 100 mL/hr over 60 Minutes Intravenous Every 8 hours 04/25/19 1109 04/26/19 1253   04/25/19 0730  ciprofloxacin (CIPRO) IVPB 400 mg     400 mg 200 mL/hr over 60 Minutes Intravenous  Once 04/25/19 0729 04/25/19 0921   04/25/19 0730  metroNIDAZOLE (FLAGYL) IVPB 500 mg     500 mg 100 mL/hr over 60 Minutes Intravenous  Once 04/25/19 0729 04/25/19 0924      Subjective:  Patient seen and examined this morning.  She appeared to be in discomfort with moaning.  She is not able to provide any history today  Objective: Vitals:   04/29/19 0539 04/29/19 0751 04/29/19 0847 04/29/19 1227  BP: 125/61 123/65  125/65  Pulse: 97 99  96  Resp: Temp: 98.7 F (37.1 C) 98.7 F (37.1 C)  97.8 F (36.6 C)  TempSrc: Axillary Axillary  Axillary  SpO2: 96% (!) 89% 91% (!) 88%  Weight:      Height:        Intake/Output Summary  (Last 24 hours) at 04/29/2019 1634 Last data filed at 04/29/2019 1025 Gross per 24 hour  Intake 930.3 ml  Output 375 ml  Net 555.3 ml   Filed Weights   04/25/19 0336 04/25/19 1438  Weight: 40.8 kg 42.9 kg    Examination:  General exam: Elderly female, generalized weakness, in moderate distress due to abdominal pain, arousable to verbal stimuli HEENT:Oral mucosa dry Respiratory system: Bilateral decreased air entry, coarse breathing sounds Cardiovascular system: Sinus tachycardia, no JVD, murmurs, rubs, gallops or clicks. Gastrointestinal system: Postsurgical abdomen, colostomy Central nervous system: Not alert or oriented Extremities: No edema, no clubbing ,no cyanosis, distal peripheral pulses palpable. Skin: No rashes, lesions or ulcers,no icterus ,no pallor    Data Reviewed: I have personally reviewed following labs and imaging studies  CBC: Recent Labs  Lab 04/25/19 0355 04/26/19 0324 04/27/19 0240 04/28/19 1007 04/28/19 1734 04/29/19 0707  WBC 7.8 19.2* 20.6* 26.0*  --  23.0*  NEUTROABS  --   --  18.5* 24.4*  --  18.8*  HGB 13.1 11.4* 10.4* 9.9* 7.6*  9.4*  HCT 40.0 33.3* 31.6* 28.7* 21.7* 26.8*  MCV 85.3 83.9 84.5 82.7  --  86.5  PLT 166 220 144* 137*  --  95*   Basic Metabolic Panel: Recent Labs  Lab 04/25/19 0355 04/26/19 0324 04/27/19 0240 04/28/19 1007 04/29/19 0707  NA 131* 133* 135 143  145 144  K 3.6 4.3 4.4 4.2  4.4 4.2  CL 98 109 106 108  109 115*  CO2 17* 14* 13* 22  22 17*  GLUCOSE 99 84 58* 100*  102* 139*  BUN 21 33* 44* 40*  40* 54*  CREATININE 1.02* 2.08* 2.52* 1.80*  1.87* 2.08*  CALCIUM 8.9 7.3* 7.8* 8.1*  8.1* 7.1*  MG  --   --  1.7  --   --   PHOS  --   --  5.4* 3.9 5.4*   GFR: Estimated Creatinine Clearance: 13.1 mL/min (A) (by C-G formula based on SCr of 2.08 mg/dL (H)). Liver Function Tests: Recent Labs  Lab 04/25/19 0355 04/27/19 0240 04/28/19 1007 04/29/19 0707  AST 46*  --   --   --   ALT 33  --   --   --    ALKPHOS 237*  --   --   --   BILITOT 1.7*  --   --   --   PROT 6.1*  --   --   --   ALBUMIN 2.4* 1.6* 1.7* 1.8*   Recent Labs  Lab 04/25/19 0355  LIPASE 15   No results for input(s): AMMONIA in the last 168 hours. Coagulation Profile: Recent Labs  Lab 04/28/19 1734  INR 1.5*   Cardiac Enzymes: No results for input(s): CKTOTAL, CKMB, CKMBINDEX, TROPONINI in the last 168 hours. BNP (last 3 results) No results for input(s): PROBNP in the last 8760 hours. HbA1C: No results for input(s): HGBA1C in the last 72 hours. CBG: Recent Labs  Lab 04/27/19 2115 04/28/19 0007 04/28/19 1100  GLUCAP 60* 181* 95   Lipid Profile: No results for input(s): CHOL, HDL, LDLCALC, TRIG, CHOLHDL, LDLDIRECT in the last 72 hours. Thyroid Function Tests: No results for input(s): TSH, T4TOTAL, FREET4, T3FREE, THYROIDAB in the last 72 hours. Anemia Panel: No results for input(s): VITAMINB12, FOLATE, FERRITIN, TIBC, IRON, RETICCTPCT in the last 72 hours. Sepsis Labs: Recent Labs  Lab 04/25/19 1700 04/25/19 2021 04/26/19 0810 04/28/19 1007  PROCALCITON 147.34  --   --   --   LATICACIDVEN 2.9* 1.6 1.6 1.8    Recent Results (from the past 240 hour(s))  SARS CORONAVIRUS 2 (TAT 6-24 HRS) Nasopharyngeal Nasopharyngeal Swab     Status: None   Collection Time: 04/25/19 10:57 AM   Specimen: Nasopharyngeal Swab  Result Value Ref Range Status   SARS Coronavirus 2 NEGATIVE NEGATIVE Final    Comment: (NOTE) SARS-CoV-2 target nucleic acids are NOT DETECTED. The SARS-CoV-2 RNA is generally detectable in upper and lower respiratory specimens during the acute phase of infection. Negative results do not preclude SARS-CoV-2 infection, do not rule out co-infections with other pathogens, and should not be used as the sole basis for treatment or other patient management decisions. Negative results must be combined with clinical observations, patient history, and epidemiological information. The expected  result is Negative. Fact Sheet for Patients: HairSlick.no Fact Sheet for Healthcare Providers: quierodirigir.com This test is not yet approved or cleared by the [place] FDA and  has been authorized for detection and/or diagnosis of SARS-CoV-2 by FDA under an Emergency Use Authorization (EUA). This  EUA will remain  in effect (meaning this test can be used) for the duration of the COVID-19 declaration under Section 56 4(b)(1) of the Act, 21 U.S.C. section 360bbb-3(b)(1), unless the authorization is terminated or revoked sooner. Performed at [facility], [place] [address], [place], [place] [address]   MRSA PCR Screening     Status: None   Collection Time: 04/25/19  2:48 PM   Specimen: Nasal Mucosa; Nasopharyngeal  Result Value Ref Range Status   MRSA by PCR NEGATIVE NEGATIVE Final    Comment:        The GeneXpert MRSA Assay (FDA approved for NASAL specimens only), is one component of a comprehensive MRSA colonization surveillance program. It is not intended to diagnose MRSA infection nor to guide or monitor treatment for MRSA infections. Performed at [facility], [place] [address] [place], [place] [address]   Culture, blood (x 2)     Status: None (Preliminary result)   Collection Time: 04/25/19  5:05 PM   Specimen: BLOOD LEFT HAND  Result Value Ref Range Status   Specimen Description BLOOD LEFT HAND  Final   Special Requests   Final    AEROBIC BOTTLE ONLY Blood Culture results may not be optimal due to an inadequate volume of blood received in culture bottles   Culture   Final    NO GROWTH 4 DAYS Performed at [facility], [place] [address] [place], [place] [address]    Report Status PENDING  Incomplete  Culture, blood (x 2)     Status: None (Preliminary result)   Collection Time: 04/25/19  5:12 PM   Specimen: BLOOD RIGHT HAND  Result Value Ref Range Status   Specimen Description BLOOD RIGHT HAND   Final   Special Requests AEROBIC BOTTLE ONLY Blood Culture adequate volume  Final   Culture   Final    NO GROWTH 4 DAYS Performed at [facility], [place] [address], [place], [place] [address]    Report Status PENDING  Incomplete         Radiology Studies: No results found.      Scheduled Meds: . sodium chloride   Intravenous Once  . Chlorhexidine Gluconate Cloth  6 each Topical Daily  . ipratropium-albuterol  3 mL Nebulization BID   Continuous Infusions: . acetaminophen 1,000 mg (04/29/19 1350)  . dextrose 5 % and 0.45% NaCl 990 mL (04/29/19 1050)  . methocarbamol (ROBAXIN) IV    . piperacillin-tazobactam (ZOSYN)  IV 2.25 g (04/29/19 1351)     LOS: 4 days    Time spent:45 mins. More than 50% of that time was spent in counseling and/or coordination of care.      Harold Hedge, DO [facility] Pager [phone]  If 7PM-7AM, please contact night-coverage [email or web address] Password [facility] 04/29/2019, 4:34 PM

## 2019-04-30 LAB — BPAM RBC
Blood Product Expiration Date: 202010032359
Blood Product Expiration Date: 202010062359
ISSUE DATE / TIME: 202009302352
ISSUE DATE / TIME: 202010010255
Unit Type and Rh: 9500
Unit Type and Rh: 9500

## 2019-04-30 LAB — CULTURE, BLOOD (ROUTINE X 2)
Culture: NO GROWTH
Culture: NO GROWTH
Special Requests: ADEQUATE

## 2019-04-30 LAB — SURGICAL PATHOLOGY

## 2019-04-30 LAB — TYPE AND SCREEN
ABO/RH(D): O NEG
Antibody Screen: NEGATIVE
Unit division: 0
Unit division: 0

## 2019-04-30 LAB — RENAL FUNCTION PANEL
Albumin: 1.7 g/dL — ABNORMAL LOW (ref 3.5–5.0)
Anion gap: 8 (ref 5–15)
BUN: 65 mg/dL — ABNORMAL HIGH (ref 8–23)
CO2: 20 mmol/L — ABNORMAL LOW (ref 22–32)
Calcium: 7.5 mg/dL — ABNORMAL LOW (ref 8.9–10.3)
Chloride: 120 mmol/L — ABNORMAL HIGH (ref 98–111)
Creatinine, Ser: 1.77 mg/dL — ABNORMAL HIGH (ref 0.44–1.00)
GFR calc Af Amer: 30 mL/min — ABNORMAL LOW (ref >60)
GFR calc non Af Amer: 26 mL/min — ABNORMAL LOW (ref >60)
Glucose, Bld: 127 mg/dL — ABNORMAL HIGH (ref 70–99)
Phosphorus: 3.1 mg/dL (ref 2.5–4.6)
Potassium: 3.8 mmol/L (ref 3.5–5.1)
Sodium: 148 mmol/L — ABNORMAL HIGH (ref 135–145)

## 2019-04-30 LAB — GLUCOSE, CAPILLARY: Glucose-Capillary: 104 mg/dL — ABNORMAL HIGH (ref 70–99)

## 2019-04-30 MED ORDER — DEXTROSE-NACL 5-0.45 % IV SOLN
INTRAVENOUS | Status: DC
  Administered 2019-04-30: 14:00:00 via INTRAVENOUS

## 2019-04-30 MED ORDER — LORAZEPAM 2 MG/ML IJ SOLN
0.5000 mg | Freq: Four times a day (QID) | INTRAMUSCULAR | Status: DC | PRN
  Administered 2019-04-30 (×2): 0.5 mg via INTRAVENOUS
  Filled 2019-04-30 (×2): qty 1

## 2019-04-30 MED ORDER — DEXTROSE 5 % IV SOLN
INTRAVENOUS | Status: AC
  Administered 2019-04-30: 21:00:00 via INTRAVENOUS

## 2019-04-30 MED ORDER — IPRATROPIUM-ALBUTEROL 0.5-2.5 (3) MG/3ML IN SOLN: 3.0000 mL | Freq: Four times a day (QID) | RESPIRATORY_TRACT | Status: DC | PRN

## 2019-04-30 MED ORDER — LORAZEPAM 2 MG/ML IJ SOLN
INTRAMUSCULAR | Status: AC
  Filled 2019-04-30: qty 1

## 2019-04-30 MED ORDER — ACETAMINOPHEN 10 MG/ML IV SOLN
1000.0000 mg | Freq: Four times a day (QID) | INTRAVENOUS | Status: AC
  Administered 2019-04-30 – 2019-05-01 (×4): 1000 mg via INTRAVENOUS
  Filled 2019-04-30 (×4): qty 100

## 2019-04-30 NOTE — Progress Notes (Signed)
[facility] Progress Note  2 Days Post-Op  Subjective: CC-  Moaning and combative this morning. Will not speak or answer questions. No output from ostomy.   Objective: Vital signs in last 24 hours: Temp:  [97.8 F (36.6 C)-99.7 F (37.6 C)] 98 F (36.7 C) (10/02 0757) Pulse Rate:  [80-98] 87 (10/02 0757) Resp:  [9-20] 11 (10/02 0757) BP: (125-154)/(58-97) 151/76 (10/02 0757) SpO2:  [85 %-95 %] 89 % (10/02 0757) Last BM Date: (PTA, new colostomy)  Intake/Output from previous day: 10/01 0701 - 10/02 0700 In: 1038.7 [I.V.:563.7; IV Piggyback:400] Out: 910 [Urine:725; Drains:185] Intake/Output this shift: Total I/O In: 70 [Other:70] Out: -   PE: Gen: moaning and combative Heart: tachy Lungs: CTAB, on O2 at 2L, sats between 85-97% Abd: moaning in pain, guarding with abdominal palpation.  Midline wound is pink/clean with no bleeding.  JP drain more serosanguinous today, g-tube in place.  Colostomy viable with no air or stool in pouch    Lab Results:  Recent Labs    04/28/19 1007 04/28/19 1734 04/29/19 0707  WBC 26.0*  --  23.0*  HGB 9.9* 7.6* 9.4*  HCT 28.7* 21.7* 26.8*  PLT 137*  --  95*   BMET Recent Labs    04/28/19 1007 04/29/19 0707  NA 143  145 144  K 4.2  4.4 4.2  CL 108  109 115*  CO2 22  22 17*  GLUCOSE 100*  102* 139*  BUN 40*  40* 54*  CREATININE 1.80*  1.87* 2.08*  CALCIUM 8.1*  8.1* 7.1*   PT/INR Recent Labs    04/28/19 1734  LABPROT 17.5*  INR 1.5*   CMP     Component Value Date/Time   NA 144 04/29/2019 0707   K 4.2 04/29/2019 0707   CL 115 (H) 04/29/2019 0707   CO2 17 (L) 04/29/2019 0707   GLUCOSE 139 (H) 04/29/2019 0707   BUN 54 (H) 04/29/2019 0707   CREATININE 2.08 (H) 04/29/2019 0707   CALCIUM 7.1 (L) 04/29/2019 0707   PROT 6.1 (L) 04/25/2019 0355   ALBUMIN 1.8 (L) 04/29/2019 0707   AST 46 (H) 04/25/2019 0355   ALT 33 04/25/2019 0355   ALKPHOS 237 (H) 04/25/2019 0355   BILITOT 1.7 (H) 04/25/2019  0355   GFRNONAA 21 (L) 04/29/2019 0707   GFRAA 24 (L) 04/29/2019 0707   Lipase     Component Value Date/Time   LIPASE 15 04/25/2019 0355       Studies/Results: No results found.  Anti-infectives: Anti-infectives (From admission, onward)   Start     Dose/Rate Route Frequency Ordered Stop   04/29/19 1400  piperacillin-tazobactam (ZOSYN) IVPB 2.25 g     2.25 g 100 mL/hr over 30 Minutes Intravenous Every 8 hours 04/29/19 1101     04/27/19 1000  cefTRIAXone (ROCEPHIN) 2 g in sodium chloride 0.9 % 100 mL IVPB  Status:  Discontinued     2 g 200 mL/hr over 30 Minutes Intravenous Every 24 hours 04/27/19 0859 04/29/19 1101   04/27/19 1000  metroNIDAZOLE (FLAGYL) IVPB 500 mg  Status:  Discontinued     500 mg 100 mL/hr over 60 Minutes Intravenous Every 8 hours 04/27/19 0859 04/29/19 1101   04/26/19 2200  piperacillin-tazobactam (ZOSYN) IVPB 3.375 g  Status:  Discontinued     3.375 g 12.5 mL/hr over 240 Minutes Intravenous Every 12 hours 04/26/19 1253 04/27/19 0858   04/26/19 1800  aztreonam (AZACTAM) 1 g in sodium chloride 0.9 % 100 mL IVPB  Status:  Discontinued     1 g 200 mL/hr over 30 Minutes Intravenous Every 12 hours 04/26/19 0805 04/26/19 1253   04/25/19 2030  ciprofloxacin (CIPRO) IVPB 200 mg  Status:  Discontinued     200 mg 100 mL/hr over 60 Minutes Intravenous Every 12 hours 04/25/19 1109 04/25/19 1640   04/25/19 2030  aztreonam (AZACTAM) 1 g in sodium chloride 0.9 % 100 mL IVPB  Status:  Discontinued     1 g 200 mL/hr over 30 Minutes Intravenous Every 8 hours 04/25/19 1651 04/26/19 0805   04/25/19 1730  aztreonam (AZACTAM) 2 g in sodium chloride 0.9 % 100 mL IVPB  Status:  Discontinued     2 g 200 mL/hr over 30 Minutes Intravenous  Once 04/25/19 1640 04/25/19 1651   04/25/19 1600  metroNIDAZOLE (FLAGYL) IVPB 500 mg  Status:  Discontinued     500 mg 100 mL/hr over 60 Minutes Intravenous Every 8 hours 04/25/19 1109 04/26/19 1253   04/25/19 0730  ciprofloxacin (CIPRO) IVPB  400 mg     400 mg 200 mL/hr over 60 Minutes Intravenous  Once 04/25/19 0729 04/25/19 0921   04/25/19 0730  metroNIDAZOLE (FLAGYL) IVPB 500 mg     500 mg 100 mL/hr over 60 Minutes Intravenous  Once 04/25/19 0729 04/25/19 9357       Assessment/Plan Constipation Tobacco abuse AKI-labs pending, nephrology following ABL anemia - Received 2 pRBC and 1 FFP 9/30, labs pending  POD 2, s/p ex lap with sigmoid colectomy, colostomy, and Stamm gastrostomy tube placement, Dr. Brantley Stage 9/30 for expected severe diverticulitis with multiple pelvic abscesses - await pathology - stoma viable - BID wet to dry dressing changes. Continue abdominal binder to protect G tube - monitor JP drainage, more serosanguninous than bloody today - await return in bowel function, hopefully can start trickle tube feedings soon - continue IV tylenol for pain control, want to limit narcotics as best as possible. Add IV ativan PRN anxiety - lab work is pending this morning  ID -cipro9/27 x1,flagyl9/27>> 10/1, aztreonam 9/27>>9/28, zosyn 9/28>>9/29, rocephin 9/29>>10/1; zosyn10/1 --> VTE -SCDs FEN -IVF, NPO Foley -foley Follow up -TBD    LOS: 5 days    Wellington Hampshire , [facility] 04/30/2019, 9:14 AM Pager: [phone] Mon-Thurs 7:00 am-4:30 pm Fri 7:00 am -11:30 AM Sat-Sun 7:00 am-11:30 am

## 2019-04-30 NOTE — Progress Notes (Signed)
Patient ID: Joanne Burke, female   DOB: May 18, 1931, 85 y.o.   MRN: [id number] [facility] Progress Note   Assessment/ Plan:   1.  Acute kidney injury: Secondary to ATN from sepsis and contrast nephropathy.  She appears to have been having some improving renal function with some stuttering/decline following need for emergent abdominal surgery.  Urine output marginal overnight and labs pending from today to assess for electrolyte/GFR.  Overall prognosis appears grim. 2.  Anion gap metabolic acidosis:  Secondary to AKI.  Monitor labs from today to decide on need for bicarbonate supplementation. 3.  Severe sigmoid diverticulitis with multiple pelvic abscesses:  After failing attempted medical management, she underwent exploratory laparotomy on 9/30 with sigmoid colectomy, colostomy and gastrostomy tube placement.  Poor outlook. 4.  Hypernatremia: Due to limited intake and insensible losses.  Status post D5W, await labs from this morning. 5.  Debility/deconditioning 6.  Encephalopathy: Multifactorial metabolic encephalopathy with acute illness delirium.  Subjective:   No acute events noted from overnight.   Objective:   BP (!) 151/76 (BP Location: Right Arm)   Pulse 87   Temp 98 F (36.7 C) (Axillary)   Resp 11   Ht 5\' 2"  (1.575 m)   Wt 42.9 kg   SpO2 (!) 89%   BMI 17.30 kg/m   Intake/Output Summary (Last 24 hours) at 04/30/2019 1243 Last data filed at 04/30/2019 0756 Gross per 24 hour  Intake 1108.74 ml  Output 835 ml  Net 273.74 ml   Weight change:   Physical Exam: Gen: Somnolent, intermittently moaning. CVS: Pulse regular rhythm, normal rate, S1 and S2 normal Resp: Coarse breath sounds bilaterally without distinct rales or rhonchi  Abd: Colostomy bag in situ, gastrostomy tube in situ.  Ongoing dressing change of abdominal wound Ext: No lower extremity edema.  Bruising of upper extremities.  Both hands in protective mittens.  Imaging: No results  found.  Labs: BMET Recent Labs  Lab 04/25/19 0355 04/26/19 0324 04/27/19 0240 04/28/19 1007 04/29/19 0707  NA 131* 133* 135 143  145 144  K 3.6 4.3 4.4 4.2  4.4 4.2  CL 98 109 106 108  109 115*  CO2 17* 14* 13* 22  22 17*  GLUCOSE 99 84 58* 100*  102* 139*  BUN 21 33* 44* 40*  40* 54*  CREATININE 1.02* 2.08* 2.52* 1.80*  1.87* 2.08*  CALCIUM 8.9 7.3* 7.8* 8.1*  8.1* 7.1*  PHOS  --   --  5.4* 3.9 5.4*   CBC Recent Labs  Lab 04/26/19 0324 04/27/19 0240 04/28/19 1007 04/28/19 1734 04/29/19 0707  WBC 19.2* 20.6* 26.0*  --  23.0*  NEUTROABS  --  18.5* 24.4*  --  18.8*  HGB 11.4* 10.4* 9.9* 7.6* 9.4*  HCT 33.3* 31.6* 28.7* 21.7* 26.8*  MCV 83.9 84.5 82.7  --  86.5  PLT 220 144* 137*  --  95*    Medications:    . sodium chloride   Intravenous Once  . Chlorhexidine Gluconate Cloth  6 each Topical Daily  . LORazepam       Elmarie Shiley, MD 04/30/2019, 12:43 PM

## 2019-04-30 NOTE — Plan of Care (Signed)
  Problem: Clinical Measurements: Goal: Will remain free from infection Outcome: Progressing Goal: Respiratory complications will improve Outcome: Progressing Goal: Cardiovascular complication will be avoided Outcome: Progressing   Problem: Nutrition: Goal: Adequate nutrition will be maintained Outcome: Progressing   Problem: Coping: Goal: Level of anxiety will decrease Outcome: Progressing   Problem: Skin Integrity: Goal: Risk for impaired skin integrity will decrease Outcome: Progressing

## 2019-04-30 NOTE — Consult Note (Signed)
[place] Nurse ostomy consult note: Initial stoma assessment Stoma type/location: RUQ colostomy (emergently created on 9/30, Dr. Brantley Stage) Stomal assessment/size: Slightly smaller than 1 and 3/4 inches, round, red, edematous, moist with os at center. Peristomal assessment: intact, multiple creases Treatment options for stomal/peristomal skin: convex skin barrier Output: None.  Ostomy pouching: 2pc. 2 and 3/4 inch pouching system.  Convex skin barrier from [employer] used. Education provided: None today.  Assessed with CCS PR B. Meuth after patient is given IV Ativan for combative behavior and confusion. Assisted with photograph of midline (clean, dry) that is currently being cared for using twice daily saline moistened gauze. See photo of both stoma and wound taken by Ms. Meuth today embedded in her note. Enrolled patient in [facility] program: No  WOC nursing team will follow, and will remain available to this patient, the nursing, srugery and medical teams.  Thanks, Maudie Flakes, MSN, RN, [place], Arther Abbott  Pager# [phone]

## 2019-04-30 NOTE — Progress Notes (Signed)
Dressing changed to abd incision.  Small amount of serosanguenous drainage noted.  Small amount of dark liquid stool starting to collect in ostomy bag.  Tolerated dressing change well.  Patient medicated twice this shift with Ativan with good effect.

## 2019-04-30 NOTE — Progress Notes (Signed)
I had a long discussion today with Mr. Doan regarding the patient's code status, amongst other things. He was very appreciative of the conversation and wishes to discuss it further with his daughters before making a decision. I will follow up with him tomorrow.   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  I will place the consult.   Harold Hedge, DO

## 2019-04-30 NOTE — Progress Notes (Signed)
PROGRESS NOTE    Joanne Burke  ZOX:[id number] DOB: 07-10-1931 DOA: 04/25/2019 PCP: Patient, No Pcp Per   Brief Narrative:  Patient is a 85 year old female with no significant past medical history who presents with abdominal pain.  She started having left lower quadrant pain about 9 days ago, decreased p.o. intake.  She has not seen a doctor for many years.  It was reported that she was in pretty good health and is ambulatory, quite independent.  When she presented she was hypertensive.  She was found to have acute kidney injury, lactic acidosis, leukocytosis.  CT imaging done in the emergency department showed possible diverticulitis, abscess versus fluid in the abdomen.  Surgery consulted.  Nephrology also following for AKI.  Plan for partial colectomy/colostomy for diverticulitis as per surgery today.  Assessment & Plan:   Principal Problem:   Diverticulitis of colon with perforation Active Problems:   Sepsis (HCC)   Abdominal pain/diverticulitis with multiple pelvic abscesses, status post exploratory laparotomy with sigmoid colectomy, colostomy and gastrostomy tube placement with mobilization of splenic flexure (POD 2): Presented with decreased oral intake, left lower quadrant abdominal pain. CT abdomen/pelvis with oral contrast  Showed potential fluid collection or abscess in the left hemipelvis,diffuse wall thickening of the sigmoid colon concerning  for diverticulitis or colitis,small volume abdominal ascites. IR was consulted earlier for possible drainage of the abscess but IR states she is not a candidate for drainage. Antibiotics: cipro9/27 x1,flagyl9/27>> 10/1, aztreonam 9/27>>9/28, zosyn 9/28>>9/29, rocephin 9/29>>10/1; zosyn10/1 --> currently.  Continue antibiotics Appears to be in pain with moaning, current pain regimen: IV Tylenol every 6 hours, morphine 1 to 2 mg every 2 hours as needed for severe pain.   Continue following with wound care.  AKI : secondary to ATN from  sepsis and CIN. Poor urine output overnight. Continue foley.  Nephrology on board.  Continue to monitor  Anion gap metabolic acidosis secondary to AKI post isotonic sodium bicarb. Follow up on labs today for further management.  Hypernatremia s/p D5W per nephrology due to limited intake and insensible losses.  Acute respiratory failure with hypoxia:CXR showed patchy bilateral airspace opacities concerning for pneumonia.  Could be aspiration pneumonia or pulmonary edema .  Continue Zosyn.    Debility/deconditioning: Spouse reports that she is quite ambulatory, independent and works on her yard.  Imaging showed bilateral acute to subacute sacral insufficiency fractures .Denied any hip pain. we will request for PT evaluation when appropriate.  Encephalopathy likely multifactorial suspect narcotic medication induced in setting of recent abdominal surgery and pain, benzodiazepine in elderly patient. Received ativan today for agitation. Continue antibiotics and restrict any medications that can alter mental status as best possible.         DVT prophylaxis: SCD Code Status: Full.  Family Communication: I will attempt to discuss code status with the husband today. Disposition Plan: Pending medical stability. Per surgery, patient has 1 in 3 chance surviving this hospitalization.    Consultants: Surgery, nephrology  Procedures:None  Antimicrobials:  Anti-infectives (From admission, onward)   Start     Dose/Rate Route Frequency Ordered Stop   04/29/19 1400  piperacillin-tazobactam (ZOSYN) IVPB 2.25 g     2.25 g 100 mL/hr over 30 Minutes Intravenous Every 8 hours 04/29/19 1101     04/27/19 1000  cefTRIAXone (ROCEPHIN) 2 g in sodium chloride 0.9 % 100 mL IVPB  Status:  Discontinued     2 g 200 mL/hr over 30 Minutes Intravenous Every 24 hours 04/27/19 0859 04/29/19 1101  04/27/19 1000  metroNIDAZOLE (FLAGYL) IVPB 500 mg  Status:  Discontinued     500 mg 100 mL/hr over 60 Minutes Intravenous  Every 8 hours 04/27/19 0859 04/29/19 1101   04/26/19 2200  piperacillin-tazobactam (ZOSYN) IVPB 3.375 g  Status:  Discontinued     3.375 g 12.5 mL/hr over 240 Minutes Intravenous Every 12 hours 04/26/19 1253 04/27/19 0858   04/26/19 1800  aztreonam (AZACTAM) 1 g in sodium chloride 0.9 % 100 mL IVPB  Status:  Discontinued     1 g 200 mL/hr over 30 Minutes Intravenous Every 12 hours 04/26/19 0805 04/26/19 1253   04/25/19 2030  ciprofloxacin (CIPRO) IVPB 200 mg  Status:  Discontinued     200 mg 100 mL/hr over 60 Minutes Intravenous Every 12 hours 04/25/19 1109 04/25/19 1640   04/25/19 2030  aztreonam (AZACTAM) 1 g in sodium chloride 0.9 % 100 mL IVPB  Status:  Discontinued     1 g 200 mL/hr over 30 Minutes Intravenous Every 8 hours 04/25/19 1651 04/26/19 0805   04/25/19 1730  aztreonam (AZACTAM) 2 g in sodium chloride 0.9 % 100 mL IVPB  Status:  Discontinued     2 g 200 mL/hr over 30 Minutes Intravenous  Once 04/25/19 1640 04/25/19 1651   04/25/19 1600  metroNIDAZOLE (FLAGYL) IVPB 500 mg  Status:  Discontinued     500 mg 100 mL/hr over 60 Minutes Intravenous Every 8 hours 04/25/19 1109 04/26/19 1253   04/25/19 0730  ciprofloxacin (CIPRO) IVPB 400 mg     400 mg 200 mL/hr over 60 Minutes Intravenous  Once 04/25/19 0729 04/25/19 0921   04/25/19 0730  metroNIDAZOLE (FLAGYL) IVPB 500 mg     500 mg 100 mL/hr over 60 Minutes Intravenous  Once 04/25/19 0729 04/25/19 0924      Subjective:  Patient seen and examined this morning sleeping comfortably. Has received ativan earlier today for agitation.   Objective: Vitals:   04/30/19 0631 04/30/19 0757 04/30/19 1231 04/30/19 1334  BP: (!) 133/58 (!) 151/76 (!) 139/97   Pulse: 80 87 84   Resp: (!) 9 11 14    Temp: 98.6 F (37 C) 98 F (36.7 C) 98 F (36.7 C)   TempSrc: Axillary Axillary Axillary   SpO2: 91% (!) 89% (!) 89%   Weight:    40.4 kg  Height:        Intake/Output Summary (Last 24 hours) at 04/30/2019 1417 Last data filed at  04/30/2019 0756 Gross per 24 hour  Intake 1108.74 ml  Output 835 ml  Net 273.74 ml   Filed Weights   04/25/19 0336 04/25/19 1438 04/30/19 1334  Weight: 40.8 kg 42.9 kg 40.4 kg    Examination:  General exam: Elderly female, generalized weakness and delirium HEENT:Oral mucosa dry Respiratory system: no acute respiratory distress Cardiovascular system: Sinus tachycardia, no JVD, murmurs, rubs, gallops or clicks. Gastrointestinal system: Postsurgical abdomen, colostomy Central nervous system: Not alert or oriented Extremities: No edema, no clubbing ,no cyanosis, distal peripheral pulses palpable. Skin: No rashes, lesions or ulcers,no icterus ,no pallor    Data Reviewed: I have personally reviewed following labs and imaging studies  CBC: Recent Labs  Lab 04/25/19 0355 04/26/19 0324 04/27/19 0240 04/28/19 1007 04/28/19 1734 04/29/19 0707  WBC 7.8 19.2* 20.6* 26.0*  --  23.0*  NEUTROABS  --   --  18.5* 24.4*  --  18.8*  HGB 13.1 11.4* 10.4* 9.9* 7.6* 9.4*  HCT 40.0 33.3* 31.6* 28.7* 21.7* 26.8*  MCV 85.3  83.9 84.5 82.7  --  86.5  PLT 166 220 144* 137*  --  95*   Basic Metabolic Panel: Recent Labs  Lab 04/25/19 0355 04/26/19 0324 04/27/19 0240 04/28/19 1007 04/29/19 0707  NA 131* 133* 135 143  145 144  K 3.6 4.3 4.4 4.2  4.4 4.2  CL 98 109 106 108  109 115*  CO2 17* 14* 13* 22  22 17*  GLUCOSE 99 84 58* 100*  102* 139*  BUN 21 33* 44* 40*  40* 54*  CREATININE 1.02* 2.08* 2.52* 1.80*  1.87* 2.08*  CALCIUM 8.9 7.3* 7.8* 8.1*  8.1* 7.1*  MG  --   --  1.7  --   --   PHOS  --   --  5.4* 3.9 5.4*   GFR: Estimated Creatinine Clearance: 12.4 mL/min (A) (by C-G formula based on SCr of 2.08 mg/dL (H)). Liver Function Tests: Recent Labs  Lab 04/25/19 0355 04/27/19 0240 04/28/19 1007 04/29/19 0707  AST 46*  --   --   --   ALT 33  --   --   --   ALKPHOS 237*  --   --   --   BILITOT 1.7*  --   --   --   PROT 6.1*  --   --   --   ALBUMIN 2.4* 1.6* 1.7* 1.8*    Recent Labs  Lab 04/25/19 0355  LIPASE 15   No results for input(s): AMMONIA in the last 168 hours. Coagulation Profile: Recent Labs  Lab 04/28/19 1734  INR 1.5*   Cardiac Enzymes: No results for input(s): CKTOTAL, CKMB, CKMBINDEX, TROPONINI in the last 168 hours. BNP (last 3 results) No results for input(s): PROBNP in the last 8760 hours. HbA1C: No results for input(s): HGBA1C in the last 72 hours. CBG: Recent Labs  Lab 04/27/19 2115 04/28/19 0007 04/28/19 1100 04/28/19 1308  GLUCAP 60* 181* 95 104*   Lipid Profile: No results for input(s): CHOL, HDL, LDLCALC, TRIG, CHOLHDL, LDLDIRECT in the last 72 hours. Thyroid Function Tests: No results for input(s): TSH, T4TOTAL, FREET4, T3FREE, THYROIDAB in the last 72 hours. Anemia Panel: No results for input(s): VITAMINB12, FOLATE, FERRITIN, TIBC, IRON, RETICCTPCT in the last 72 hours. Sepsis Labs: Recent Labs  Lab 04/25/19 1700 04/25/19 2021 04/26/19 0810 04/28/19 1007  PROCALCITON 147.34  --   --   --   LATICACIDVEN 2.9* 1.6 1.6 1.8    Recent Results (from the past 240 hour(s))  SARS CORONAVIRUS 2 (TAT 6-24 HRS) Nasopharyngeal Nasopharyngeal Swab     Status: None   Collection Time: 04/25/19 10:57 AM   Specimen: Nasopharyngeal Swab  Result Value Ref Range Status   SARS Coronavirus 2 NEGATIVE NEGATIVE Final    Comment: (NOTE) SARS-CoV-2 target nucleic acids are NOT DETECTED. The SARS-CoV-2 RNA is generally detectable in upper and lower respiratory specimens during the acute phase of infection. Negative results do not preclude SARS-CoV-2 infection, do not rule out co-infections with other pathogens, and should not be used as the sole basis for treatment or other patient management decisions. Negative results must be combined with clinical observations, patient history, and epidemiological information. The expected result is Negative. Fact Sheet for Patients: SugarRoll.be Fact Sheet  for Healthcare Providers: https://www.woods-mathews.com/ This test is not yet approved or cleared by the [place] FDA and  has been authorized for detection and/or diagnosis of SARS-CoV-2 by FDA under an Emergency Use Authorization (EUA). This EUA will remain  in effect (meaning this test  can be used) for the duration of the COVID-19 declaration under Section 56 4(b)(1) of the Act, 21 U.S.C. section 360bbb-3(b)(1), unless the authorization is terminated or revoked sooner. Performed at [facility], [address], [place], [place] [address]   MRSA PCR Screening     Status: None   Collection Time: 04/25/19  2:48 PM   Specimen: Nasal Mucosa; Nasopharyngeal  Result Value Ref Range Status   MRSA by PCR NEGATIVE NEGATIVE Final    Comment:        The GeneXpert MRSA Assay (FDA approved for NASAL specimens only), is one component of a comprehensive MRSA colonization surveillance program. It is not intended to diagnose MRSA infection nor to guide or monitor treatment for MRSA infections. Performed at [facility], [address], [place], [place] [address]   Culture, blood (x 2)     Status: None   Collection Time: 04/25/19  5:05 PM   Specimen: BLOOD LEFT HAND  Result Value Ref Range Status   Specimen Description BLOOD LEFT HAND  Final   Special Requests   Final    AEROBIC BOTTLE ONLY Blood Culture results may not be optimal due to an inadequate volume of blood received in culture bottles   Culture   Final    NO GROWTH 5 DAYS Performed at [facility], [address], [place], [place] [address]    Report Status 04/30/2019 FINAL  Final  Culture, blood (x 2)     Status: None   Collection Time: 04/25/19  5:12 PM   Specimen: BLOOD RIGHT HAND  Result Value Ref Range Status   Specimen Description BLOOD RIGHT HAND  Final   Special Requests AEROBIC BOTTLE ONLY Blood Culture adequate volume  Final   Culture   Final    NO GROWTH 5 DAYS Performed at  [facility], [address], [place], [place] [address]    Report Status 04/30/2019 FINAL  Final         Radiology Studies: No results found.      Scheduled Meds: . sodium chloride   Intravenous Once  . Chlorhexidine Gluconate Cloth  6 each Topical Daily  . LORazepam       Continuous Infusions: . acetaminophen 1,000 mg (04/30/19 0905)  . dextrose 5 % and 0.45% NaCl 75 mL/hr at 04/30/19 1413  . methocarbamol (ROBAXIN) IV    . piperacillin-tazobactam (ZOSYN)  IV 2.25 g (04/30/19 1402)     LOS: 5 days    Time spent:35 mins. More than 50% of that time was spent in counseling and/or coordination of care.      Jae Dire, DO [facility] Pager [phone]  If 7PM-7AM, please contact night-coverage [email or web address] Password [email or web address] 04/30/2019, 2:17 PM

## 2019-05-01 LAB — CBC
HCT: 24.9 % — ABNORMAL LOW (ref 36.0–46.0)
HCT: 27.1 % — ABNORMAL LOW (ref 36.0–46.0)
Hemoglobin: 9.1 g/dL — ABNORMAL LOW (ref 12.0–15.0)
Hemoglobin: 9.5 g/dL — ABNORMAL LOW (ref 12.0–15.0)
MCH: 31.6 pg (ref 26.0–34.0)
MCH: 30.7 pg (ref 26.0–34.0)
MCHC: 36.5 g/dL — ABNORMAL HIGH (ref 30.0–36.0)
MCHC: 35.1 g/dL (ref 30.0–36.0)
MCV: 86.5 fL (ref 80.0–100.0)
MCV: 87.7 fL (ref 80.0–100.0)
Platelets: 155 10*3/uL (ref 150–400)
Platelets: 174 10*3/uL (ref 150–400)
RBC: 2.88 MIL/uL — ABNORMAL LOW (ref 3.87–5.11)
RBC: 3.09 MIL/uL — ABNORMAL LOW (ref 3.87–5.11)
RDW: 16.5 % — ABNORMAL HIGH (ref 11.5–15.5)
RDW: 17.4 % — ABNORMAL HIGH (ref 11.5–15.5)
WBC: 38.5 10*3/uL — ABNORMAL HIGH (ref 4.0–10.5)
WBC: 45.8 10*3/uL — ABNORMAL HIGH (ref 4.0–10.5)
nRBC: 0.5 % — ABNORMAL HIGH (ref 0.0–0.2)
nRBC: 0.6 % — ABNORMAL HIGH (ref 0.0–0.2)

## 2019-05-01 LAB — RENAL FUNCTION PANEL
Albumin: 1.8 g/dL — ABNORMAL LOW (ref 3.5–5.0)
Anion gap: 11 (ref 5–15)
BUN: 58 mg/dL — ABNORMAL HIGH (ref 8–23)
CO2: 20 mmol/L — ABNORMAL LOW (ref 22–32)
Calcium: 7.7 mg/dL — ABNORMAL LOW (ref 8.9–10.3)
Chloride: 118 mmol/L — ABNORMAL HIGH (ref 98–111)
Creatinine, Ser: 1.55 mg/dL — ABNORMAL HIGH (ref 0.44–1.00)
GFR calc Af Amer: 35 mL/min — ABNORMAL LOW (ref >60)
GFR calc non Af Amer: 30 mL/min — ABNORMAL LOW (ref >60)
Glucose, Bld: 95 mg/dL (ref 70–99)
Phosphorus: 2.8 mg/dL (ref 2.5–4.6)
Potassium: 3.8 mmol/L (ref 3.5–5.1)
Sodium: 149 mmol/L — ABNORMAL HIGH (ref 135–145)

## 2019-05-01 MED ORDER — DEXTROSE 5 % IV SOLN
INTRAVENOUS | Status: DC
  Administered 2019-05-01: 16:00:00 via INTRAVENOUS

## 2019-05-01 NOTE — Progress Notes (Signed)
3 Days Post-Op   Subjective/Chief Complaint: Confused, wants water Husband in the room   Objective: Vital signs in last 24 hours: Temp:  [97.9 F (36.6 C)-98.4 F (36.9 C)] 98.4 F (36.9 C) (10/03 0032) Pulse Rate:  [72-89] 72 (10/03 0800) Resp:  [9-21] 15 (10/03 0800) BP: (101-177)/(55-97) 165/83 (10/03 0800) SpO2:  [89 %-100 %] 100 % (10/03 0800) Weight:  [40.4 kg] 40.4 kg (10/02 1334) Last BM Date: 04/30/19  Intake/Output from previous day: 10/02 0701 - 10/03 0700 In: 1611 [I.V.:289.6; IV Piggyback:251.4] Out: 160 [Drains:160] Intake/Output this shift: No intake/output data recorded.  Exam: Awake and alert, confused Abdomen soft, open wound clean, ostomy viable,' Drain serosang  Lab Results:  Recent Labs    04/30/19 1501 05/01/19 0725  WBC 38.5* 45.8*  HGB 9.1* 9.5*  HCT 24.9* 27.1*  PLT 155 174   BMET Recent Labs    04/30/19 1501 05/01/19 0725  NA 148* 149*  K 3.8 3.8  CL 120* 118*  CO2 20* 20*  GLUCOSE 127* 95  BUN 65* 58*  CREATININE 1.77* 1.55*  CALCIUM 7.5* 7.7*   PT/INR Recent Labs    04/28/19 1734  LABPROT 17.5*  INR 1.5*   ABG No results for input(s): PHART, HCO3 in the last 72 hours.  Invalid input(s): PCO2, PO2  Studies/Results: No results found.  Anti-infectives: Anti-infectives (From admission, onward)   Start     Dose/Rate Route Frequency Ordered Stop   04/29/19 1400  piperacillin-tazobactam (ZOSYN) IVPB 2.25 g     2.25 g 100 mL/hr over 30 Minutes Intravenous Every 8 hours 04/29/19 1101     04/27/19 1000  cefTRIAXone (ROCEPHIN) 2 g in sodium chloride 0.9 % 100 mL IVPB  Status:  Discontinued     2 g 200 mL/hr over 30 Minutes Intravenous Every 24 hours 04/27/19 0859 04/29/19 1101   04/27/19 1000  metroNIDAZOLE (FLAGYL) IVPB 500 mg  Status:  Discontinued     500 mg 100 mL/hr over 60 Minutes Intravenous Every 8 hours 04/27/19 0859 04/29/19 1101   04/26/19 2200  piperacillin-tazobactam (ZOSYN) IVPB 3.375 g  Status:   Discontinued     3.375 g 12.5 mL/hr over 240 Minutes Intravenous Every 12 hours 04/26/19 1253 04/27/19 0858   04/26/19 1800  aztreonam (AZACTAM) 1 g in sodium chloride 0.9 % 100 mL IVPB  Status:  Discontinued     1 g 200 mL/hr over 30 Minutes Intravenous Every 12 hours 04/26/19 0805 04/26/19 1253   04/25/19 2030  ciprofloxacin (CIPRO) IVPB 200 mg  Status:  Discontinued     200 mg 100 mL/hr over 60 Minutes Intravenous Every 12 hours 04/25/19 1109 04/25/19 1640   04/25/19 2030  aztreonam (AZACTAM) 1 g in sodium chloride 0.9 % 100 mL IVPB  Status:  Discontinued     1 g 200 mL/hr over 30 Minutes Intravenous Every 8 hours 04/25/19 1651 04/26/19 0805   04/25/19 1730  aztreonam (AZACTAM) 2 g in sodium chloride 0.9 % 100 mL IVPB  Status:  Discontinued     2 g 200 mL/hr over 30 Minutes Intravenous  Once 04/25/19 1640 04/25/19 1651   04/25/19 1600  metroNIDAZOLE (FLAGYL) IVPB 500 mg  Status:  Discontinued     500 mg 100 mL/hr over 60 Minutes Intravenous Every 8 hours 04/25/19 1109 04/26/19 1253   04/25/19 0730  ciprofloxacin (CIPRO) IVPB 400 mg     400 mg 200 mL/hr over 60 Minutes Intravenous  Once 04/25/19 0729 04/25/19 0921   04/25/19  0730  metroNIDAZOLE (FLAGYL) IVPB 500 mg     500 mg 100 mL/hr over 60 Minutes Intravenous  Once 04/25/19 0729 04/25/19 0924      Assessment/Plan: s/p Procedure(s): EXPLORATORY LAPAROTOMY (N/A) SIGMOID COLECTOMY (N/A) COLOSTOMY (N/A) INSERTION OF GASTROSTOMY TUBE (N/A)  WBC increasing for uncertain reason Will continue current IV antibiotics Hold on tube feeds Will allow sips of water Overall deconditioned  I discussed with her husband  LOS: 6 days    Abigail Miyamoto 05/01/2019

## 2019-05-01 NOTE — Progress Notes (Signed)
Visited with patient in response to Spiritual Consult. Consult was for prayer with patient and her husband but husband was not present at the time. Will pass consult on to in-coming on-call chaplain for follow up.  Rev. [place].

## 2019-05-01 NOTE — Progress Notes (Signed)
Patient ID: Joanne Burke, female   DOB: [birth year], 85 y.o.   MRN: [id number] [facility] Progress Note   Assessment/ Plan:   1.  Acute kidney injury: Secondary to ATN from sepsis and contrast nephropathy.  She appears to have been having some improving renal function with some stuttering/decline following need for emergent abdominal surgery.  Unclear if urine output was accurately charted overnight as it simply does not explain her improving BUN/creatinine.  Improving mental status/oral intake noted. 2.  Anion gap metabolic acidosis:  Secondary to AKI.  Monitor labs from today to decide on need for bicarbonate supplementation. 3.  Severe sigmoid diverticulitis with multiple pelvic abscesses:  After failing attempted medical management, she underwent exploratory laparotomy on 9/30 with sigmoid colectomy, colostomy and gastrostomy tube placement.  Poor outlook. 4.  Hypernatremia: Secondary to limited oral intake with previous encephalopathy and insensible losses.  Mental status improving as is her oral intake, supplement with D5 water at 125 cc/h  2 L (free water deficit around 3 L). 5.  Debility/deconditioning 6.  Encephalopathy: Multifactorial metabolic encephalopathy with acute illness delirium.  Subjective:   No acute events noted from overnight.   Objective:   BP (!) 165/83   Pulse 72   Temp 98.6 F (37 C) (Oral)   Resp 15   Ht 5\' 2"  (1.575 m)   Wt 40.4 kg   SpO2 100%   BMI 16.29 kg/m   Intake/Output Summary (Last 24 hours) at 05/01/2019 1031 Last data filed at 05/01/2019 0300 Gross per 24 hour  Intake 1541.03 ml  Output 160 ml  Net 1381.03 ml   Weight change:   Physical Exam: Gen: Awake/more alert, sitting up in bed drinking Ensure. CVS: Pulse regular rhythm, normal rate, S1 and S2 normal Resp: Coarse breath sounds bilaterally without distinct rales or rhonchi  Abd: Colostomy bag in situ, gastrostomy tube in situ.  Open abdominal wound Ext: No lower  extremity edema.  Bruising of upper extremities.  Imaging: No results found.  Labs: BMET Recent Labs  Lab 04/25/19 0355 04/26/19 0324 04/27/19 0240 04/28/19 1007 04/29/19 0707 04/30/19 1501 05/01/19 0725  NA 131* 133* 135 143  145 144 148* 149*  K 3.6 4.3 4.4 4.2  4.4 4.2 3.8 3.8  CL 98 109 106 108  109 115* 120* 118*  CO2 17* 14* 13* 22  22 17* 20* 20*  GLUCOSE 99 84 58* 100*  102* 139* 127* 95  BUN 21 33* 44* 40*  40* 54* 65* 58*  CREATININE 1.02* 2.08* 2.52* 1.80*  1.87* 2.08* 1.77* 1.55*  CALCIUM 8.9 7.3* 7.8* 8.1*  8.1* 7.1* 7.5* 7.7*  PHOS  --   --  5.4* 3.9 5.4* 3.1 2.8   CBC Recent Labs  Lab 04/27/19 0240 04/28/19 1007 04/28/19 1734 04/29/19 0707 04/30/19 1501 05/01/19 0725  WBC 20.6* 26.0*  --  23.0* 38.5* 45.8*  NEUTROABS 18.5* 24.4*  --  18.8*  --   --   HGB 10.4* 9.9* 7.6* 9.4* 9.1* 9.5*  HCT 31.6* 28.7* 21.7* 26.8* 24.9* 27.1*  MCV 84.5 82.7  --  86.5 86.5 87.7  PLT 144* 137*  --  95* 155 174    Medications:    . sodium chloride   Intravenous Once  . Chlorhexidine Gluconate Cloth  6 each Topical Daily   Elmarie Shiley, MD 05/01/2019, 10:31 AM

## 2019-05-01 NOTE — Progress Notes (Signed)
PROGRESS NOTE    Joanne Burke  ZOX:[id number] DOB: [birth year] DOA: 04/25/2019 PCP: Patient, No Pcp Per   Brief Narrative:  Patient is a 85 year old female with no significant past medical history who presents with abdominal pain.  She started having left lower quadrant pain about 9 days ago, decreased p.o. intake.  She has not seen a doctor for many years.  It was reported that she was in pretty good health and is ambulatory, quite independent.  When she presented she was hypertensive.  She was found to have acute kidney injury, lactic acidosis, leukocytosis.  CT imaging done in the emergency department showed possible diverticulitis, abscess versus fluid in the abdomen.  Surgery consulted.  Nephrology also following for AKI.  Underwent exploratory laparotomy with sigmoid colectomy, colostomy and gastrostomy tube placement with mobilization of splenic flexure  Assessment & Plan:   Principal Problem:   Diverticulitis of colon with perforation Active Problems:   Sepsis (HCC)   Abdominal pain/diverticulitis with multiple pelvic abscesses, status post exploratory laparotomy with sigmoid colectomy, colostomy and gastrostomy tube placement with mobilization of splenic flexure (POD 3):presented with decreased oral intake, left lower quadrant abdominal pain. CT abdomen/pelvis with oral contrast  Showed potential fluid collection or abscess in the left hemipelvis,diffuse wall thickening of the sigmoid colon concerning  for diverticulitis or colitis,small volume abdominal ascites. IR was consulted earlier for possible drainage of the abscess but IR stated not a candidate for drainage. Antibiotics: cipro9/27 x1,flagyl9/27>> 10/1, aztreonam 9/27>>9/28, zosyn 9/28>>9/29, rocephin 9/29>>10/1; zosyn10/1 --> currently.  Continue antibiotics Appears to be in pain with moaning, current pain regimen: IV Tylenol every 6 hours, morphine 1 to 2 mg every 2 hours as needed for severe pain.   Continue following  with wound care. Today is day 7 of antibiotics, currently on Zosyn, WBC increasing (46) however afebrile and does not appear toxic, this may be reactive. -will consider discontinuing antibiotics   AKI : secondary to ATN from sepsis and CIN. Poor urine output overnight. Continue foley.  Nephrology on board.  Continue to monitor  Non-anion gap metabolic acidosis likely from component of hyperchloremic acidosis and AKI received bicarb earlier in hospitalization.  Gently hydrated with D5W overnight and improvement in her acidosis and AKI.   - Nephrology on board and has started D5W at 125 cc /hour as patient has 3 L free water deficit.  Hypernatremia s/p D5W per nephrology due to limited intake and insensible losses.  As above  Acute respiratory failure with hypoxia: Improved CXR showed patchy bilateral airspace opacities concerning for pneumonia 9/29: Could be aspiration pneumonia or pulmonary edema.  Today is day 7 of antibiotics, currently on Zosyn without any signs of infection with the exception of an elevated white count as above however this is possibly reactive. -Consider discontinuing antibiotic as above  Debility/deconditioning: Spouse reports that she is quite ambulatory, independent and works on her yard.  Imaging showed bilateral acute to subacute sacral insufficiency fractures .Denied any hip pain. we will request for PT evaluation when appropriate.  Encephalopathy likely multifactorial significantly improved today, I was able to have a conversation with the patient which I have not been able to in the past.  Suspect narcotic medication induced in setting of recent abdominal surgery and pain, hypernatremia, benzodiazepine in elderly patient with AKI. Has received multiple doses of Ativan for agitation.  - restrict any medications that can alter mental status as best possible. -Keep window shades open and reorient at bedside before giving medication  DVT prophylaxis:  SCD Code Status: Full.  Had discussion with patient's husband yesterday.  Will readdress. Family Communication: We will discuss with family today Disposition Plan: Pending medical stability.    Consultants: Surgery, nephrology  Procedures:None  Antimicrobials:  Anti-infectives (From admission, onward)   Start     Dose/Rate Route Frequency Ordered Stop   04/29/19 1400  piperacillin-tazobactam (ZOSYN) IVPB 2.25 g     2.25 g 100 mL/hr over 30 Minutes Intravenous Every 8 hours 04/29/19 1101     04/27/19 1000  cefTRIAXone (ROCEPHIN) 2 g in sodium chloride 0.9 % 100 mL IVPB  Status:  Discontinued     2 g 200 mL/hr over 30 Minutes Intravenous Every 24 hours 04/27/19 0859 04/29/19 1101   04/27/19 1000  metroNIDAZOLE (FLAGYL) IVPB 500 mg  Status:  Discontinued     500 mg 100 mL/hr over 60 Minutes Intravenous Every 8 hours 04/27/19 0859 04/29/19 1101   04/26/19 2200  piperacillin-tazobactam (ZOSYN) IVPB 3.375 g  Status:  Discontinued     3.375 g 12.5 mL/hr over 240 Minutes Intravenous Every 12 hours 04/26/19 1253 04/27/19 0858   04/26/19 1800  aztreonam (AZACTAM) 1 g in sodium chloride 0.9 % 100 mL IVPB  Status:  Discontinued     1 g 200 mL/hr over 30 Minutes Intravenous Every 12 hours 04/26/19 0805 04/26/19 1253   04/25/19 2030  ciprofloxacin (CIPRO) IVPB 200 mg  Status:  Discontinued     200 mg 100 mL/hr over 60 Minutes Intravenous Every 12 hours 04/25/19 1109 04/25/19 1640   04/25/19 2030  aztreonam (AZACTAM) 1 g in sodium chloride 0.9 % 100 mL IVPB  Status:  Discontinued     1 g 200 mL/hr over 30 Minutes Intravenous Every 8 hours 04/25/19 1651 04/26/19 0805   04/25/19 1730  aztreonam (AZACTAM) 2 g in sodium chloride 0.9 % 100 mL IVPB  Status:  Discontinued     2 g 200 mL/hr over 30 Minutes Intravenous  Once 04/25/19 1640 04/25/19 1651   04/25/19 1600  metroNIDAZOLE (FLAGYL) IVPB 500 mg  Status:  Discontinued     500 mg 100 mL/hr over 60 Minutes Intravenous Every 8 hours 04/25/19 1109  04/26/19 1253   04/25/19 0730  ciprofloxacin (CIPRO) IVPB 400 mg     400 mg 200 mL/hr over 60 Minutes Intravenous  Once 04/25/19 0729 04/25/19 0921   04/25/19 0730  metroNIDAZOLE (FLAGYL) IVPB 500 mg     500 mg 100 mL/hr over 60 Minutes Intravenous  Once 04/25/19 0729 04/25/19 0924      Subjective:  Patient seen this morning initially moaning very loudly and agitated.  When I went to talk with the patient she was able to settle down and have a small conversation with me.  She stated that she was very scared to die here and that she did not want to die.  She also stated she was very thirsty and wished to have some ice chips which were brought to the room and communicated with the nurse.  Denied any pain. Objective: Vitals:   05/01/19 0227 05/01/19 0432 05/01/19 0800 05/01/19 1240  BP:  (!) 177/94 (!) 165/83 (!) 156/82  Pulse: 72 89 72 82  Resp: (!) 21 18 15 20   Temp:   98.6 F (37 C) 98.6 F (37 C)  TempSrc:   Oral Axillary  SpO2: 97% 100% 100% 100%  Weight:      Height:        Intake/Output Summary (Last 24 hours)  at 05/01/2019 1518 Last data filed at 05/01/2019 1240 Gross per 24 hour  Intake 1781.03 ml  Output 460 ml  Net 1321.03 ml   Filed Weights   04/25/19 0336 04/25/19 1438 04/30/19 1334  Weight: 40.8 kg 42.9 kg 40.4 kg    Examination:  General exam: Elderly female, frail, initially agitated HEENT: Very dry mucosa Respiratory system: no acute respiratory distress Cardiovascular system: Sinus tachycardia, no JVD, murmurs, rubs, gallops or clicks. Gastrointestinal system: Postsurgical abdomen, colostomy Central nervous system: Awake and alert Extremities: No edema, no clubbing ,no cyanosis, distal peripheral pulses palpable. Skin: No rashes, lesions or ulcers,no icterus ,no pallor    Data Reviewed: I have personally reviewed following labs and imaging studies  CBC: Recent Labs  Lab 04/27/19 0240 04/28/19 1007 04/28/19 1734 04/29/19 0707 04/30/19 1501  05/01/19 0725  WBC 20.6* 26.0*  --  23.0* 38.5* 45.8*  NEUTROABS 18.5* 24.4*  --  18.8*  --   --   HGB 10.4* 9.9* 7.6* 9.4* 9.1* 9.5*  HCT 31.6* 28.7* 21.7* 26.8* 24.9* 27.1*  MCV 84.5 82.7  --  86.5 86.5 87.7  PLT 144* 137*  --  95* 155 174   Basic Metabolic Panel: Recent Labs  Lab 04/27/19 0240 04/28/19 1007 04/29/19 0707 04/30/19 1501 05/01/19 0725  NA 135 143   145 144 148* 149*  K 4.4 4.2   4.4 4.2 3.8 3.8  CL 106 108   109 115* 120* 118*  CO2 13* 22   22 17* 20* 20*  GLUCOSE 58* 100*   102* 139* 127* 95  BUN 44* 40*   40* 54* 65* 58*  CREATININE 2.52* 1.80*   1.87* 2.08* 1.77* 1.55*  CALCIUM 7.8* 8.1*   8.1* 7.1* 7.5* 7.7*  MG 1.7  --   --   --   --   PHOS 5.4* 3.9 5.4* 3.1 2.8   GFR: Estimated Creatinine Clearance: 16.6 mL/min (A) (by C-G formula based on SCr of 1.55 mg/dL (H)). Liver Function Tests: Recent Labs  Lab 04/25/19 0355 04/27/19 0240 04/28/19 1007 04/29/19 0707 04/30/19 1501 05/01/19 0725  AST 46*  --   --   --   --   --   ALT 33  --   --   --   --   --   ALKPHOS 237*  --   --   --   --   --   BILITOT 1.7*  --   --   --   --   --   PROT 6.1*  --   --   --   --   --   ALBUMIN 2.4* 1.6* 1.7* 1.8* 1.7* 1.8*   Recent Labs  Lab 04/25/19 0355  LIPASE 15   No results for input(s): AMMONIA in the last 168 hours. Coagulation Profile: Recent Labs  Lab 04/28/19 1734  INR 1.5*   Cardiac Enzymes: No results for input(s): CKTOTAL, CKMB, CKMBINDEX, TROPONINI in the last 168 hours. BNP (last 3 results) No results for input(s): PROBNP in the last 8760 hours. HbA1C: No results for input(s): HGBA1C in the last 72 hours. CBG: Recent Labs  Lab 04/27/19 2115 04/28/19 0007 04/28/19 1100 04/28/19 1308  GLUCAP 60* 181* 95 104*   Lipid Profile: No results for input(s): CHOL, HDL, LDLCALC, TRIG, CHOLHDL, LDLDIRECT in the last 72 hours. Thyroid Function Tests: No results for input(s): TSH, T4TOTAL, FREET4, T3FREE, THYROIDAB in the last 72  hours. Anemia Panel: No results for input(s): VITAMINB12, FOLATE, FERRITIN,  TIBC, IRON, RETICCTPCT in the last 72 hours. Sepsis Labs: Recent Labs  Lab 04/25/19 1700 04/25/19 2021 04/26/19 0810 04/28/19 1007  PROCALCITON 147.34  --   --   --   LATICACIDVEN 2.9* 1.6 1.6 1.8    Recent Results (from the past 240 hour(s))  SARS CORONAVIRUS 2 (TAT 6-24 HRS) Nasopharyngeal Nasopharyngeal Swab     Status: None   Collection Time: 04/25/19 10:57 AM   Specimen: Nasopharyngeal Swab  Result Value Ref Range Status   SARS Coronavirus 2 NEGATIVE NEGATIVE Final    Comment: (NOTE) SARS-CoV-2 target nucleic acids are NOT DETECTED. The SARS-CoV-2 RNA is generally detectable in upper and lower respiratory specimens during the acute phase of infection. Negative results do not preclude SARS-CoV-2 infection, do not rule out co-infections with other pathogens, and should not be used as the sole basis for treatment or other patient management decisions. Negative results must be combined with clinical observations, patient history, and epidemiological information. The expected result is Negative. Fact Sheet for Patients: HairSlick.nohttps://www.fda.gov/media/138098/download Fact Sheet for Healthcare Providers: quierodirigir.comhttps://www.fda.gov/media/138095/download This test is not yet approved or cleared by the [place] FDA and  has been authorized for detection and/or diagnosis of SARS-CoV-2 by FDA under an Emergency Use Authorization (EUA). This EUA will remain  in effect (meaning this test can be used) for the duration of the COVID-19 declaration under Section 56 4(b)(1) of the Act, 21 U.S.C. section 360bbb-3(b)(1), unless the authorization is terminated or revoked sooner. Performed at [facility], [address] [place], [place] [address]   MRSA PCR Screening     Status: None   Collection Time: 04/25/19  2:48 PM   Specimen: Nasal Mucosa; Nasopharyngeal  Result Value Ref Range Status   MRSA by PCR  NEGATIVE NEGATIVE Final    Comment:        The GeneXpert MRSA Assay (FDA approved for NASAL specimens only), is one component of a comprehensive MRSA colonization surveillance program. It is not intended to diagnose MRSA infection nor to guide or monitor treatment for MRSA infections. Performed at [facility], [address] [place], [place] [address]   Culture, blood (x 2)     Status: None   Collection Time: 04/25/19  5:05 PM   Specimen: BLOOD LEFT HAND  Result Value Ref Range Status   Specimen Description BLOOD LEFT HAND  Final   Special Requests   Final    AEROBIC BOTTLE ONLY Blood Culture results may not be optimal due to an inadequate volume of blood received in culture bottles   Culture   Final    NO GROWTH 5 DAYS Performed at [facility], [address] [place], [place] [address]    Report Status 04/30/2019 FINAL  Final  Culture, blood (x 2)     Status: None   Collection Time: 04/25/19  5:12 PM   Specimen: BLOOD RIGHT HAND  Result Value Ref Range Status   Specimen Description BLOOD RIGHT HAND  Final   Special Requests AEROBIC BOTTLE ONLY Blood Culture adequate volume  Final   Culture   Final    NO GROWTH 5 DAYS Performed at [facility], [address] [place], [place] [address]    Report Status 04/30/2019 FINAL  Final         Radiology Studies: No results found.      Scheduled Meds:  sodium chloride   Intravenous Once   Chlorhexidine Gluconate Cloth  6 each Topical Daily  Continuous Infusions:  dextrose     methocarbamol (ROBAXIN) IV     piperacillin-tazobactam (ZOSYN)  IV 2.25 g (05/01/19 0702)     LOS: 6 days    Time spent: 45 mins. More than 50% of that time was spent in counseling and/or coordination of care.      Jae Dire, DO [facility] Pager [phone]  If 7PM-7AM, please contact night-coverage [email or web address] Password [email or web address] 05/01/2019, 3:18 PM

## 2019-05-02 LAB — CBC
HCT: 26.3 % — ABNORMAL LOW (ref 36.0–46.0)
Hemoglobin: 9.1 g/dL — ABNORMAL LOW (ref 12.0–15.0)
MCH: 31 pg (ref 26.0–34.0)
MCHC: 34.6 g/dL (ref 30.0–36.0)
MCV: 89.5 fL (ref 80.0–100.0)
Platelets: 277 10*3/uL (ref 150–400)
RBC: 2.94 MIL/uL — ABNORMAL LOW (ref 3.87–5.11)
RDW: 17.8 % — ABNORMAL HIGH (ref 11.5–15.5)
WBC: 49.5 10*3/uL — ABNORMAL HIGH (ref 4.0–10.5)
nRBC: 0.7 % — ABNORMAL HIGH (ref 0.0–0.2)

## 2019-05-02 LAB — RENAL FUNCTION PANEL
Albumin: 1.6 g/dL — ABNORMAL LOW (ref 3.5–5.0)
Anion gap: 10 (ref 5–15)
BUN: 33 mg/dL — ABNORMAL HIGH (ref 8–23)
CO2: 21 mmol/L — ABNORMAL LOW (ref 22–32)
Calcium: 7.3 mg/dL — ABNORMAL LOW (ref 8.9–10.3)
Chloride: 108 mmol/L (ref 98–111)
Creatinine, Ser: 0.95 mg/dL (ref 0.44–1.00)
GFR calc Af Amer: >60 mL/min (ref >60)
GFR calc non Af Amer: 54 mL/min — ABNORMAL LOW (ref >60)
Glucose, Bld: 173 mg/dL — ABNORMAL HIGH (ref 70–99)
Phosphorus: 2 mg/dL — ABNORMAL LOW (ref 2.5–4.6)
Potassium: 3.2 mmol/L — ABNORMAL LOW (ref 3.5–5.1)
Sodium: 139 mmol/L (ref 135–145)

## 2019-05-02 LAB — GLUCOSE, CAPILLARY
Glucose-Capillary: 100 mg/dL — ABNORMAL HIGH (ref 70–99)
Glucose-Capillary: 102 mg/dL — ABNORMAL HIGH (ref 70–99)

## 2019-05-02 LAB — MAGNESIUM: Magnesium: 1.8 mg/dL (ref 1.7–2.4)

## 2019-05-02 MED ORDER — K PHOS MONO-SOD PHOS DI & MONO 155-852-130 MG PO TABS
250.0000 mg | ORAL_TABLET | Freq: Once | ORAL | Status: DC
  Filled 2019-05-02: qty 1

## 2019-05-02 MED ORDER — POTASSIUM & SODIUM PHOSPHATES 280-160-250 MG PO PACK
1.0000 | PACK | Freq: Once | ORAL | Status: AC
  Administered 2019-05-03: 01:00:00 1 via ORAL
  Filled 2019-05-02: qty 1

## 2019-05-02 MED ORDER — ACETAMINOPHEN 10 MG/ML IV SOLN
1000.0000 mg | Freq: Four times a day (QID) | INTRAVENOUS | Status: AC
  Administered 2019-05-02 – 2019-05-03 (×4): 1000 mg via INTRAVENOUS
  Filled 2019-05-02 (×4): qty 100

## 2019-05-02 MED ORDER — POTASSIUM CHLORIDE 20 MEQ PO PACK
20.0000 meq | PACK | Freq: Once | ORAL | Status: AC
  Administered 2019-05-02: 14:00:00 20 meq via ORAL
  Filled 2019-05-02: qty 1

## 2019-05-02 MED ORDER — VITAL HIGH PROTEIN PO LIQD
1000.0000 mL | ORAL | Status: DC
  Administered 2019-05-02 – 2019-05-06 (×5): 1000 mL
  Filled 2019-05-02 (×6): qty 1000

## 2019-05-02 NOTE — Progress Notes (Signed)
Chaplain followed up on spiritual consult, noted pt. was asleep. RN Delana Meyer shared that husband has not been bedside due to his own illness, and that he had been in the ER overnight on the same day pt was admitted. Chaplain contacted husband by phone, who advised that indeed he had been having some heart issues.  Married 39 years, states wife is "love of his life", hoping she will get better, but "trusts God's will". Requested that chaplain not disturb patient for prayer at this time per doctor's recommendation that pt needs rest, and not to be roused. Husband expressed appreciation for call.  If husband or pt would benefit from another visit, please advise.    Luana Shu [phone]    05/02/19 1100  Clinical Encounter Type  Visited With Patient not available  Visit Type Initial;Spiritual support;Post-op  Referral From Chaplain  Consult/Referral To Chaplain  Spiritual Encounters  Spiritual Needs Emotional

## 2019-05-02 NOTE — Progress Notes (Signed)
PROGRESS NOTE    Joanne Burke  YFV:[id number] DOB: 07-Dec-1930 DOA: 04/25/2019 PCP: Patient, No Pcp Per   Brief Narrative:  Patient is a 85 year old female with no significant past medical history who presents with abdominal pain.  She started having left lower quadrant pain about 9 days ago, decreased p.o. intake.  She has not seen a doctor for many years.  It was reported that she was in pretty good health and is ambulatory, quite independent.  When she presented she was hypertensive.  She was found to have acute kidney injury, lactic acidosis, leukocytosis.  CT imaging done in the emergency department showed possible diverticulitis, abscess versus fluid in the abdomen.  Surgery consulted.  Nephrology also following for AKI.  Underwent exploratory laparotomy with sigmoid colectomy, colostomy and gastrostomy tube placement with mobilization of splenic flexure  Assessment & Plan:   Principal Problem:   Diverticulitis of colon with perforation Active Problems:   Sepsis (HCC)   Abdominal pain/diverticulitis with multiple pelvic abscesses, status post exploratory laparotomy with sigmoid colectomy, colostomy and gastrostomy tube placement with mobilization of splenic flexure (POD 3):presented with decreased oral intake, left lower quadrant abdominal pain. CT abdomen/pelvis with oral contrast  Showed potential fluid collection or abscess in the left hemipelvis,diffuse wall thickening of the sigmoid colon concerning  for diverticulitis or colitis,small volume abdominal ascites. IR was consulted earlier for possible drainage of the abscess but IR stated not a candidate for drainage. Antibiotics: cipro9/27 x1,flagyl9/27>> 10/1, aztreonam 9/27>>9/28, zosyn 9/28>>9/29, rocephin 9/29>>10/1; zosyn10/1 --> currently.  Continue antibiotics Appears to be in pain with moaning, current pain regimen: IV Tylenol every 6 hours, morphine 1 to 2 mg every 2 hours as needed for severe pain.   Continue following  with wound care. - Today is day 8 of antibiotics, currently on Zosyn, WBC increasing (46->49) however afebrile and does not appear toxic, this may be reactive? - if patient is having increased stool output, consider testing for C diff as she has had prolonged antibiotics.  - RD consulted for enteral/tube feeding initiation  Hypokalemia repleted. Monitor  Hypophosphatemia K phos  AKI : resolved. secondary to ATN from sepsis and CIN. Poor urine output overnight. Continue foley.  Nephrology signed off.  Continue to monitor  Non-anion gap metabolic acidosis likely from component of hyperchloremic acidosis and AKI received bicarb earlier in hospitalization.  Gently hydrated with D5W overnight and improvement in her acidosis and AKI.  Received D5W overnight. monitor  Hypernatremia s/p D5W per nephrology due to limited intake and insensible losses.  As above  Acute respiratory failure with hypoxia: Improved CXR showed patchy bilateral airspace opacities concerning for pneumonia 9/29: Could be aspiration pneumonia or pulmonary edema.  Today is day 7 of antibiotics, currently on Zosyn without any signs of infection with the exception of an elevated white count as above however this is possibly reactive. -antibiotics as above  Debility/deconditioning: Spouse reports that she is quite ambulatory, independent and works on her yard.  Imaging showed bilateral acute to subacute sacral insufficiency fractures .Denied any hip pain. we will request for PT evaluation when appropriate.  Encephalopathy likely multifactorial significantly improved today, I was able to have a conversation with the patient which I have not been able to in the past.  Suspect narcotic medication induced in setting of recent abdominal surgery and pain, hypernatremia, benzodiazepine in elderly patient with AKI. Has received multiple doses of Ativan for agitation.  - restrict any medications that can alter mental status as best possible.  -  Keep window shades open and reorient at bedside before giving medication     DVT prophylaxis: SCD Code Status: Full. Family Communication: discussed with husband at bedside Disposition Plan: Pending medical stability.    Consultants: Surgery, nephrology  Procedures:None  Antimicrobials:  Anti-infectives (From admission, onward)   Start     Dose/Rate Route Frequency Ordered Stop   04/29/19 1400  piperacillin-tazobactam (ZOSYN) IVPB 2.25 g     2.25 g 100 mL/hr over 30 Minutes Intravenous Every 8 hours 04/29/19 1101     04/27/19 1000  cefTRIAXone (ROCEPHIN) 2 g in sodium chloride 0.9 % 100 mL IVPB  Status:  Discontinued     2 g 200 mL/hr over 30 Minutes Intravenous Every 24 hours 04/27/19 0859 04/29/19 1101   04/27/19 1000  metroNIDAZOLE (FLAGYL) IVPB 500 mg  Status:  Discontinued     500 mg 100 mL/hr over 60 Minutes Intravenous Every 8 hours 04/27/19 0859 04/29/19 1101   04/26/19 2200  piperacillin-tazobactam (ZOSYN) IVPB 3.375 g  Status:  Discontinued     3.375 g 12.5 mL/hr over 240 Minutes Intravenous Every 12 hours 04/26/19 1253 04/27/19 0858   04/26/19 1800  aztreonam (AZACTAM) 1 g in sodium chloride 0.9 % 100 mL IVPB  Status:  Discontinued     1 g 200 mL/hr over 30 Minutes Intravenous Every 12 hours 04/26/19 0805 04/26/19 1253   04/25/19 2030  ciprofloxacin (CIPRO) IVPB 200 mg  Status:  Discontinued     200 mg 100 mL/hr over 60 Minutes Intravenous Every 12 hours 04/25/19 1109 04/25/19 1640   04/25/19 2030  aztreonam (AZACTAM) 1 g in sodium chloride 0.9 % 100 mL IVPB  Status:  Discontinued     1 g 200 mL/hr over 30 Minutes Intravenous Every 8 hours 04/25/19 1651 04/26/19 0805   04/25/19 1730  aztreonam (AZACTAM) 2 g in sodium chloride 0.9 % 100 mL IVPB  Status:  Discontinued     2 g 200 mL/hr over 30 Minutes Intravenous  Once 04/25/19 1640 04/25/19 1651   04/25/19 1600  metroNIDAZOLE (FLAGYL) IVPB 500 mg  Status:  Discontinued     500 mg 100 mL/hr over 60 Minutes  Intravenous Every 8 hours 04/25/19 1109 04/26/19 1253   04/25/19 0730  ciprofloxacin (CIPRO) IVPB 400 mg     400 mg 200 mL/hr over 60 Minutes Intravenous  Once 04/25/19 0729 04/25/19 0921   04/25/19 0730  metroNIDAZOLE (FLAGYL) IVPB 500 mg     500 mg 100 mL/hr over 60 Minutes Intravenous  Once 04/25/19 0729 04/25/19 [id number]      Subjective:  Patient seen at bedside awake and alert today and conversive.  States that she does not feel well and has generalized malaise.  Not have any specific pain or complaints otherwise. Objective: Vitals:   05/02/19 0325 05/02/19 0800 05/02/19 1124 05/02/19 1202  BP:  (!) 107/59 114/65   Pulse:      Resp:      Temp: 98.9 F (37.2 C) 97.7 F (36.5 C)  (!) 97.5 F (36.4 C)  TempSrc: Axillary Oral  Oral  SpO2:      Weight: 88 kg     Height:        Intake/Output Summary (Last 24 hours) at 05/02/2019 1849 Last data filed at 05/02/2019 1508 Gross per 24 hour  Intake 1668.7 ml  Output 2050 ml  Net -381.3 ml   Filed Weights   04/25/19 1438 04/30/19 1334 05/02/19 0325  Weight: 42.9 kg 40.4 kg 88 kg  Examination:  General exam: Elderly female, frail, awake and alert, responsive conversive HEENT: Moist oral mucosa Respiratory system: no acute respiratory distress Cardiovascular system: Sinus tachycardia, no JVD, murmurs, rubs, gallops or clicks. Gastrointestinal system: Postsurgical abdomen, colostomy Central nervous system: Awake and alert Extremities: No edema, no clubbing ,no cyanosis, distal peripheral pulses palpable. Skin: No rashes, lesions or ulcers,no icterus ,no pallor    Data Reviewed: I have personally reviewed following labs and imaging studies  CBC: Recent Labs  Lab 04/27/19 0240 04/28/19 1007 04/28/19 1734 04/29/19 0707 04/30/19 1501 05/01/19 0725 05/02/19 0805  WBC 20.6* 26.0*  --  23.0* 38.5* 45.8* 49.5*  NEUTROABS 18.5* 24.4*  --  18.8*  --   --   --   HGB 10.4* 9.9* 7.6* 9.4* 9.1* 9.5* 9.1*  HCT 31.6* 28.7*  21.7* 26.8* 24.9* 27.1* 26.3*  MCV 84.5 82.7  --  86.5 86.5 87.7 89.5  PLT 144* 137*  --  95* 155 174 277   Basic Metabolic Panel: Recent Labs  Lab 04/27/19 0240 04/28/19 1007 04/29/19 0707 04/30/19 1501 05/01/19 0725 05/02/19 0805  NA 135 143  145 144 148* 149* 139  K 4.4 4.2  4.4 4.2 3.8 3.8 3.2*  CL 106 108  109 115* 120* 118* 108  CO2 13* 22  22 17* 20* 20* 21*  GLUCOSE 58* 100*  102* 139* 127* 95 173*  BUN 44* 40*  40* 54* 65* 58* 33*  CREATININE 2.52* 1.80*  1.87* 2.08* 1.77* 1.55* 0.95  CALCIUM 7.8* 8.1*  8.1* 7.1* 7.5* 7.7* 7.3*  MG 1.7  --   --   --   --  1.8  PHOS 5.4* 3.9 5.4* 3.1 2.8 2.0*   GFR: Estimated Creatinine Clearance: 43.8 mL/min (by C-G formula based on SCr of 0.95 mg/dL). Liver Function Tests: Recent Labs  Lab 04/28/19 1007 04/29/19 0707 04/30/19 1501 05/01/19 0725 05/02/19 0805  ALBUMIN 1.7* 1.8* 1.7* 1.8* 1.6*   No results for input(s): LIPASE, AMYLASE in the last 168 hours. No results for input(s): AMMONIA in the last 168 hours. Coagulation Profile: Recent Labs  Lab 04/28/19 1734  INR 1.5*   Cardiac Enzymes: No results for input(s): CKTOTAL, CKMB, CKMBINDEX, TROPONINI in the last 168 hours. BNP (last 3 results) No results for input(s): PROBNP in the last 8760 hours. HbA1C: No results for input(s): HGBA1C in the last 72 hours. CBG: Recent Labs  Lab 04/27/19 2115 04/28/19 0007 04/28/19 1100 04/28/19 1308  GLUCAP 60* 181* 95 104*   Lipid Profile: No results for input(s): CHOL, HDL, LDLCALC, TRIG, CHOLHDL, LDLDIRECT in the last 72 hours. Thyroid Function Tests: No results for input(s): TSH, T4TOTAL, FREET4, T3FREE, THYROIDAB in the last 72 hours. Anemia Panel: No results for input(s): VITAMINB12, FOLATE, FERRITIN, TIBC, IRON, RETICCTPCT in the last 72 hours. Sepsis Labs: Recent Labs  Lab 04/25/19 2021 04/26/19 0810 04/28/19 1007  LATICACIDVEN 1.6 1.6 1.8    Recent Results (from the past 240 hour(s))  SARS  CORONAVIRUS 2 (TAT 6-24 HRS) Nasopharyngeal Nasopharyngeal Swab     Status: None   Collection Time: 04/25/19 10:57 AM   Specimen: Nasopharyngeal Swab  Result Value Ref Range Status   SARS Coronavirus 2 NEGATIVE NEGATIVE Final    Comment: (NOTE) SARS-CoV-2 target nucleic acids are NOT DETECTED. The SARS-CoV-2 RNA is generally detectable in upper and lower respiratory specimens during the acute phase of infection. Negative results do not preclude SARS-CoV-2 infection, do not rule out co-infections with other pathogens, and should not be  used as the sole basis for treatment or other patient management decisions. Negative results must be combined with clinical observations, patient history, and epidemiological information. The expected result is Negative. Fact Sheet for Patients: SugarRoll.be Fact Sheet for Healthcare Providers: https://www.woods-mathews.com/ This test is not yet approved or cleared by the [place] FDA and  has been authorized for detection and/or diagnosis of SARS-CoV-2 by FDA under an Emergency Use Authorization (EUA). This EUA will remain  in effect (meaning this test can be used) for the duration of the COVID-19 declaration under Section 56 4(b)(1) of the Act, 21 U.S.C. section 360bbb-3(b)(1), unless the authorization is terminated or revoked sooner. Performed at [facility], [place] [address], [place], [place] [address]   MRSA PCR Screening     Status: None   Collection Time: 04/25/19  2:48 PM   Specimen: Nasal Mucosa; Nasopharyngeal  Result Value Ref Range Status   MRSA by PCR NEGATIVE NEGATIVE Final    Comment:        The GeneXpert MRSA Assay (FDA approved for NASAL specimens only), is one component of a comprehensive MRSA colonization surveillance program. It is not intended to diagnose MRSA infection nor to guide or monitor treatment for MRSA infections. Performed at [facility], [place]  [address], [place], [place] [address]   Culture, blood (x 2)     Status: None   Collection Time: 04/25/19  5:05 PM   Specimen: BLOOD LEFT HAND  Result Value Ref Range Status   Specimen Description BLOOD LEFT HAND  Final   Special Requests   Final    AEROBIC BOTTLE ONLY Blood Culture results may not be optimal due to an inadequate volume of blood received in culture bottles   Culture   Final    NO GROWTH 5 DAYS Performed at [facility], [place] [address] [place], [place] [address]    Report Status 04/30/2019 FINAL  Final  Culture, blood (x 2)     Status: None   Collection Time: 04/25/19  5:12 PM   Specimen: BLOOD RIGHT HAND  Result Value Ref Range Status   Specimen Description BLOOD RIGHT HAND  Final   Special Requests AEROBIC BOTTLE ONLY Blood Culture adequate volume  Final   Culture   Final    NO GROWTH 5 DAYS Performed at [facility], [place] [address] [place], [place] [address]    Report Status 04/30/2019 FINAL  Final         Radiology Studies: No results found.      Scheduled Meds: . sodium chloride   Intravenous Once  . Chlorhexidine Gluconate Cloth  6 each Topical Daily  . feeding supplement (VITAL HIGH PROTEIN)  1,000 mL Per Tube Q24H   Continuous Infusions: . acetaminophen 1,000 mg (05/02/19 1200)  . methocarbamol (ROBAXIN) IV    . piperacillin-tazobactam (ZOSYN)  IV 2.25 g (05/02/19 1422)     LOS: 7 days    Time spent: 35 mins. More than 50% of that time was spent in counseling and/or coordination of care.      Harold Hedge, DO [facility] Pager [phone]  If 7PM-7AM, please contact night-coverage [email or web address] Password [facility] 05/02/2019, 6:49 PM

## 2019-05-02 NOTE — Progress Notes (Addendum)
Patient ID: Joanne Burke, female   DOB: [birth year], 85 y.o.   MRN: [id number] [facility] Progress Note   Assessment/ Plan:   1.  Acute kidney injury: Secondary to ATN from sepsis and contrast nephropathy.  She appears to have been having some improving renal function with some stuttering/decline following need for emergent abdominal surgery.  Labs this morning continue to indicate renal recovery with creatinine down to 0.9 and decent urine output overnight. 2.  Anion gap metabolic acidosis:  Secondary to AKI.  Improved status post fluids. 3.  Severe sigmoid diverticulitis with multiple pelvic abscesses:  After failing attempted medical management, she underwent exploratory laparotomy on 9/30 with sigmoid colectomy, colostomy and gastrostomy tube placement.  Poor outlook. 4.  Hypernatremia: Secondary to limited oral intake with previous encephalopathy and insensible losses.  Improving with increased oral intake and status post IV fluids.  Discontinue intravenous fluids at this time and allow for oral intake. 5.  Debility/deconditioning 6.  Encephalopathy: Multifactorial metabolic encephalopathy with acute illness delirium.  Will sign off at this time, please call with questions or concerns.  Subjective:   No acute events noted from overnight, complains of abdominal pain.   Objective:   BP (!) 107/59 (BP Location: Right Arm)   Pulse 72   Temp 97.7 F (36.5 C) (Oral)   Resp 20   Ht 5\' 2"  (1.575 m)   Wt 88 kg   SpO2 97%   BMI 35.48 kg/m   Intake/Output Summary (Last 24 hours) at 05/02/2019 0932 Last data filed at 05/02/2019 0400 Gross per 24 hour  Intake 1829.5 ml  Output 1450 ml  Net 379.5 ml   Weight change: 47.6 kg  Physical Exam: Gen: Comfortably sleeping in bed, complains of pain when awakened - confused CVS: Pulse regular rhythm, normal rate, S1 and S2 normal Resp: Coarse breath sounds bilaterally without distinct rales or rhonchi  Abd: Colostomy bag in  situ, gastrostomy tube in situ.  Open abdominal wound Ext: No lower extremity edema.  Bruising of upper extremities.  Imaging: No results found.  Labs: BMET Recent Labs  Lab 04/26/19 0324 04/27/19 0240 04/28/19 1007 04/29/19 0707 04/30/19 1501 05/01/19 0725 05/02/19 0805  NA 133* 135 143  145 144 148* 149* PENDING  K 4.3 4.4 4.2  4.4 4.2 3.8 3.8 3.2*  CL 109 106 108  109 115* 120* 118* 108  CO2 14* 13* 22  22 17* 20* 20* 21*  GLUCOSE 84 58* 100*  102* 139* 127* 95 173*  BUN 33* 44* 40*  40* 54* 65* 58* 33*  CREATININE 2.08* 2.52* 1.80*  1.87* 2.08* 1.77* 1.55* 0.95  CALCIUM 7.3* 7.8* 8.1*  8.1* 7.1* 7.5* 7.7* 7.3*  PHOS  --  5.4* 3.9 5.4* 3.1 2.8 2.0*   CBC Recent Labs  Lab 04/27/19 0240 04/28/19 1007 04/28/19 1734 04/29/19 0707 04/30/19 1501 05/01/19 0725  WBC 20.6* 26.0*  --  23.0* 38.5* 45.8*  NEUTROABS 18.5* 24.4*  --  18.8*  --   --   HGB 10.4* 9.9* 7.6* 9.4* 9.1* 9.5*  HCT 31.6* 28.7* 21.7* 26.8* 24.9* 27.1*  MCV 84.5 82.7  --  86.5 86.5 87.7  PLT 144* 137*  --  95* 155 174    Medications:    . sodium chloride   Intravenous Once  . Chlorhexidine Gluconate Cloth  6 each Topical Daily   Elmarie Shiley, MD 05/02/2019, 9:32 AM

## 2019-05-02 NOTE — Progress Notes (Signed)
4 Days Post-Op  Subjective: CC: Patient confused. No family in the room. Has begun having output from colostomy.   Objective: Vital signs in last 24 hours: Temp:  [97.5 F (36.4 C)-98.9 F (37.2 C)] 97.7 F (36.5 C) (10/04 0800) Pulse Rate:  [72-82] 72 (10/03 2357) Resp:  [15-20] 20 (10/03 2357) BP: (107-156)/(59-88) 107/59 (10/04 0800) SpO2:  [94 %-100 %] 97 % (10/03 2357) Weight:  [88 kg] 88 kg (10/04 0325) Last BM Date: 05/02/19  Intake/Output from previous day: 10/03 0701 - 10/04 0700 In: 1829.5 [P.O.:340; I.V.:1439.5; IV Piggyback:50] Out: 1750 [Urine:1250; Drains:500] Intake/Output this shift: No intake/output data recorded.  PE: Gen: moaning and combative Lungs: normal rate and effort Abd: Difficult to determine if patient has tenderness but she is without any guarding or rigidity. Midline wound is pink/clean with no bleeding. JP drain bloody serosanguinous today, g-tube in place. Colostomy bag filled with thin dark black stool.     Lab Results:  Recent Labs    04/30/19 1501 05/01/19 0725  WBC 38.5* 45.8*  HGB 9.1* 9.5*  HCT 24.9* 27.1*  PLT 155 174   BMET Recent Labs    04/30/19 1501 05/01/19 0725  NA 148* 149*  K 3.8 3.8  CL 120* 118*  CO2 20* 20*  GLUCOSE 127* 95  BUN 65* 58*  CREATININE 1.77* 1.55*  CALCIUM 7.5* 7.7*   PT/INR No results for input(s): LABPROT, INR in the last 72 hours. CMP     Component Value Date/Time   NA 149 (H) 05/01/2019 0725   K 3.8 05/01/2019 0725   CL 118 (H) 05/01/2019 0725   CO2 20 (L) 05/01/2019 0725   GLUCOSE 95 05/01/2019 0725   BUN 58 (H) 05/01/2019 0725   CREATININE 1.55 (H) 05/01/2019 0725   CALCIUM 7.7 (L) 05/01/2019 0725   PROT 6.1 (L) 04/25/2019 0355   ALBUMIN 1.8 (L) 05/01/2019 0725   AST 46 (H) 04/25/2019 0355   ALT 33 04/25/2019 0355   ALKPHOS 237 (H) 04/25/2019 0355   BILITOT 1.7 (H) 04/25/2019 0355   GFRNONAA 30 (L) 05/01/2019 0725   GFRAA 35 (L) 05/01/2019 0725   Lipase      Component Value Date/Time   LIPASE 15 04/25/2019 0355       Studies/Results: No results found.  Anti-infectives: Anti-infectives (From admission, onward)   Start     Dose/Rate Route Frequency Ordered Stop   04/29/19 1400  piperacillin-tazobactam (ZOSYN) IVPB 2.25 g     2.25 g 100 mL/hr over 30 Minutes Intravenous Every 8 hours 04/29/19 1101     04/27/19 1000  cefTRIAXone (ROCEPHIN) 2 g in sodium chloride 0.9 % 100 mL IVPB  Status:  Discontinued     2 g 200 mL/hr over 30 Minutes Intravenous Every 24 hours 04/27/19 0859 04/29/19 1101   04/27/19 1000  metroNIDAZOLE (FLAGYL) IVPB 500 mg  Status:  Discontinued     500 mg 100 mL/hr over 60 Minutes Intravenous Every 8 hours 04/27/19 0859 04/29/19 1101   04/26/19 2200  piperacillin-tazobactam (ZOSYN) IVPB 3.375 g  Status:  Discontinued     3.375 g 12.5 mL/hr over 240 Minutes Intravenous Every 12 hours 04/26/19 1253 04/27/19 0858   04/26/19 1800  aztreonam (AZACTAM) 1 g in sodium chloride 0.9 % 100 mL IVPB  Status:  Discontinued     1 g 200 mL/hr over 30 Minutes Intravenous Every 12 hours 04/26/19 0805 04/26/19 1253   04/25/19 2030  ciprofloxacin (CIPRO) IVPB 200 mg  Status:  Discontinued     200 mg 100 mL/hr over 60 Minutes Intravenous Every 12 hours 04/25/19 1109 04/25/19 1640   04/25/19 2030  aztreonam (AZACTAM) 1 g in sodium chloride 0.9 % 100 mL IVPB  Status:  Discontinued     1 g 200 mL/hr over 30 Minutes Intravenous Every 8 hours 04/25/19 1651 04/26/19 0805   04/25/19 1730  aztreonam (AZACTAM) 2 g in sodium chloride 0.9 % 100 mL IVPB  Status:  Discontinued     2 g 200 mL/hr over 30 Minutes Intravenous  Once 04/25/19 1640 04/25/19 1651   04/25/19 1600  metroNIDAZOLE (FLAGYL) IVPB 500 mg  Status:  Discontinued     500 mg 100 mL/hr over 60 Minutes Intravenous Every 8 hours 04/25/19 1109 04/26/19 1253   04/25/19 0730  ciprofloxacin (CIPRO) IVPB 400 mg     400 mg 200 mL/hr over 60 Minutes Intravenous  Once 04/25/19 0729 04/25/19  0921   04/25/19 0730  metroNIDAZOLE (FLAGYL) IVPB 500 mg     500 mg 100 mL/hr over 60 Minutes Intravenous  Once 04/25/19 0729 04/25/19 4665       Assessment/Plan Constipation Tobacco abuse AKI-Cr improving, 1.55, nephrology following ABL anemia - Received 2 pRBC and 1 FFP 9/30, Hgb stable at 9.5 on 10/3. Repeat AM labs. Black tarry stool in bag.   POD 4, s/p ex lap with sigmoid colectomy, colostomy, and Stamm gastrostomy tube placement, Dr. Luisa Hart 9/30 for expected severe diverticulitis with multiple pelvic abscesses - Pathology with diverticulitis  - Stoma viable - BID wet to dry dressing changes. Continue abdominal binder to protect G tube - monitor JP drainage - Continue IV tylenol for pain control, want to limit narcotics as best as possible. Add IV ativan PRN anxiety - CBC pending.   ID -cipro9/27 x1,flagyl9/27>>10/1, aztreonam 9/27>>9/28, zosyn 9/28>>9/29, rocephin 9/29>>10/1; zosyn10/1 --> (likely will need 10-14 days total of abx, however will discuss with MD duration of abx) VTE -SCDs, Hgb stable x 72 hours (last hgb 10/3). Plt 174. Await CBC given black stools before considering starting chemical prophylaxis.  FEN -IVF, NPO, trickle tube feeds Foley -foley  Follow up -TBD    LOS: 7 days    Jacinto Halim , [facility] 05/02/2019, 9:13 AM Pager: [phone]

## 2019-05-02 NOTE — Progress Notes (Addendum)
Brief Nutrition Note RD working remotely.  Consult received for enteral/tube feeding initiation and management.  Per consult plan is to begin trickle tube feeds today. Adult Enteral Nutrition Protocol initiated. Full assessment to follow.  Enteral Access: 16 Fr. G-tube placed 9/30 in OR  Noted phosphorus and potassium have been decreasing. Recommend monitoring potassium, phosphorus, and potassium and replacing as needed as patient is at risk for refeeding syndrome.  Admitting Dx: Pelvic fluid collection [R18.8]  Body mass index is 35.48 kg/m. Pt meets criteria for obesity class II based on current BMI.  Labs:  Recent Labs  Lab 04/27/19 0240  04/30/19 1501 05/01/19 0725 05/02/19 0805  NA 135   < > 148* 149* 139  K 4.4   < > 3.8 3.8 3.2*  CL 106   < > 120* 118* 108  CO2 13*   < > 20* 20* 21*  BUN 44*   < > 65* 58* 33*  CREATININE 2.52*   < > 1.77* 1.55* 0.95  CALCIUM 7.8*   < > 7.5* 7.7* 7.3*  MG 1.7  --   --   --  1.8  PHOS 5.4*   < > 3.1 2.8 2.0*  GLUCOSE 58*   < > 127* 95 173*   < > = values in this interval not displayed.    Willey Blade, MS, [facility], LDN Office: [phone] Pager: [phone] After Hours/Weekend Pager: [phone]

## 2019-05-03 LAB — RENAL FUNCTION PANEL
Albumin: 1.6 g/dL — ABNORMAL LOW (ref 3.5–5.0)
Anion gap: 11 (ref 5–15)
BUN: 25 mg/dL — ABNORMAL HIGH (ref 8–23)
CO2: 21 mmol/L — ABNORMAL LOW (ref 22–32)
Calcium: 7.6 mg/dL — ABNORMAL LOW (ref 8.9–10.3)
Chloride: 103 mmol/L (ref 98–111)
Creatinine, Ser: 0.75 mg/dL (ref 0.44–1.00)
GFR calc Af Amer: >60 mL/min (ref >60)
GFR calc non Af Amer: >60 mL/min (ref >60)
Glucose, Bld: 82 mg/dL (ref 70–99)
Phosphorus: 2.4 mg/dL — ABNORMAL LOW (ref 2.5–4.6)
Potassium: 4.3 mmol/L (ref 3.5–5.1)
Sodium: 135 mmol/L (ref 135–145)

## 2019-05-03 LAB — GLUCOSE, CAPILLARY
Glucose-Capillary: 99 mg/dL (ref 70–99)
Glucose-Capillary: 107 mg/dL — ABNORMAL HIGH (ref 70–99)
Glucose-Capillary: 99 mg/dL (ref 70–99)
Glucose-Capillary: 148 mg/dL — ABNORMAL HIGH (ref 70–99)
Glucose-Capillary: 105 mg/dL — ABNORMAL HIGH (ref 70–99)

## 2019-05-03 LAB — CBC
HCT: 27.8 % — ABNORMAL LOW (ref 36.0–46.0)
Hemoglobin: 9.8 g/dL — ABNORMAL LOW (ref 12.0–15.0)
MCH: 30.9 pg (ref 26.0–34.0)
MCHC: 35.3 g/dL (ref 30.0–36.0)
MCV: 87.7 fL (ref 80.0–100.0)
Platelets: 342 10*3/uL (ref 150–400)
RBC: 3.17 MIL/uL — ABNORMAL LOW (ref 3.87–5.11)
RDW: 17.7 % — ABNORMAL HIGH (ref 11.5–15.5)
WBC: 40.4 10*3/uL — ABNORMAL HIGH (ref 4.0–10.5)
nRBC: 0.5 % — ABNORMAL HIGH (ref 0.0–0.2)

## 2019-05-03 MED ORDER — TRAMADOL HCL 50 MG PO TABS: 100.0000 mg | ORAL_TABLET | Freq: Three times a day (TID) | ORAL | Status: DC | PRN

## 2019-05-03 MED ORDER — TRAMADOL HCL 50 MG PO TABS
50.0000 mg | ORAL_TABLET | Freq: Three times a day (TID) | ORAL | Status: DC | PRN
  Administered 2019-05-03 – 2019-05-14 (×9): 50 mg via ORAL
  Filled 2019-05-03 (×9): qty 1

## 2019-05-03 MED ORDER — ACETAMINOPHEN 500 MG PO TABS
1000.0000 mg | ORAL_TABLET | Freq: Three times a day (TID) | ORAL | Status: DC
  Administered 2019-05-03 – 2019-05-16 (×32): 1000 mg via ORAL
  Filled 2019-05-03 (×35): qty 2

## 2019-05-03 MED ORDER — LIP MEDEX EX OINT
TOPICAL_OINTMENT | CUTANEOUS | Status: DC | PRN
  Filled 2019-05-03: qty 7

## 2019-05-03 MED ORDER — BOOST / RESOURCE BREEZE PO LIQD CUSTOM
1.0000 | Freq: Three times a day (TID) | ORAL | Status: DC
  Administered 2019-05-03 – 2019-05-04 (×3): 1 via ORAL

## 2019-05-03 MED ORDER — MORPHINE SULFATE (PF) 2 MG/ML IV SOLN
1.0000 mg | INTRAVENOUS | Status: DC | PRN
  Administered 2019-05-03: 2 mg via INTRAVENOUS
  Administered 2019-05-03: 13:00:00 1 mg via INTRAVENOUS
  Administered 2019-05-04: 21:00:00 2 mg via INTRAVENOUS
  Administered 2019-05-15: 1 mg via INTRAVENOUS
  Administered 2019-05-16: 04:00:00 2 mg via INTRAVENOUS
  Filled 2019-05-03 (×5): qty 1

## 2019-05-03 NOTE — Progress Notes (Addendum)
Initial Nutrition Assessment  DOCUMENTATION CODES:   Obesity unspecified  INTERVENTION:   -Boost Breeze po TID, each supplement provides 250 kcal and 9 grams of protein -RD will follow for diet advancement and adjust supplement as appropriate -Continue trickle feedings of Vital High Protein @ 20 ml/hr via g-tube  Tube feeding regimen provides 480 kcal (31% of needs), 42 grams of protein, and 401 ml of H2O.   -If TF is advanced, initiate Vital 1.2 @ 20 ml/hr via g-tube and increase by 10 ml every 8 hours to goal rate of 55 ml/hr.   Tube feeding regimen provides 1584 kcal (100% of needs), 99 grams of protein, and 1071 ml of H2O.   NUTRITION DIAGNOSIS:   Increased nutrient needs related to post-op healing as evidenced by estimated needs.  GOAL:   Patient will meet greater than or equal to 90% of their needs  MONITOR:   PO intake, Supplement acceptance, Labs, Weight trends, TF tolerance, Skin, I & O's  REASON FOR ASSESSMENT:   Consult Assessment of nutrition requirement/status, Enteral/tube feeding initiation and management  ASSESSMENT:   Joanne Burke is a 85 y.o. female with no significant medical history presenting with abdominal pain.   She had abdominal pain in the summer and it resolved; she had swelling at the time.  About 9 days ago, she started with LLQ pain with n/v, decreased PO.  She has not been to the doctor in years but the pain was finally so bad that she had to come.  No fevers.  Pt admitted with diverticulitis of colon with perforation.   9/30- s/p Procedure: Exporter laparotomy with sigmoid colectomy, colostomy and Stamm gastrostomy tube placement Mobilization of splenic flexure 10/4- trickle TF started 10/5- advanced to clear liquids  Reviewed I/O's: -888 ml x 24 hours and +7.6 L since admission  UOP: 550 ml x 24 hours  Drain output: 340 ml x 24 hours  Colostomy output: 600 ml x 24 hours  Spoke with pt at bedside, who was pleasant, but confused  at time of visit. Pt also reports having difficulty hearing this RD, due to ears "being stuffed up". Pt reports tolerating water/ clear liquids well. Pt endorses her stomach hurts "a little", but was not aware that she was receiving TF via g-tube. Attempted to obtain nutrition history, however, unable to at this time. Husband was not present at time of visit.   Reviewed wt hx; noted increased in wt, to which edema may be contributing.   Trickle feedings of Vital High Protein @ 20 ml/hr infusing via g-tube. Regimen providing 480 kcal (31% of needs), 42 grams of protein, and 401 ml of H2O. Per general surgery notes, no plans to advance TF today.   General surgery requesting assistance with additional kcals and protein while pt on clear liquids. RD will add Boost Breeze supplements.   Albumin has a half-life of 21 days and is strongly affected by stress response and inflammatory process, therefore, do not expect to see an improvement in this lab value during acute hospitalization. When a patient presents with low albumin, it is likely skewed due to the acute inflammatory response.  Unless it is suspected that patient had poor PO intake or malnutrition prior to admission, then RD should not be consulted solely for low albumin. Note that low albumin is no longer used to diagnose malnutrition; [place] uses the new malnutrition guidelines published by the American Society for Parenteral and Enteral Nutrition (A.S.P.E.N.) and the Academy of Nutrition and  Dietetics (AND).    Labs reviewed: CBGS: 99-107, Phos: 2.4.  NUTRITION - FOCUSED PHYSICAL EXAM:    Most Recent Value  Orbital Region  No depletion  Upper Arm Region  Mild depletion  Thoracic and Lumbar Region  No depletion  Buccal Region  No depletion  Temple Region  Mild depletion  Clavicle Bone Region  No depletion  Clavicle and Acromion Bone Region  No depletion  Scapular Bone Region  No depletion  Dorsal Hand  No depletion  Patellar Region   Moderate depletion  Anterior Thigh Region  Moderate depletion  Posterior Calf Region  Moderate depletion  Edema (RD Assessment)  Moderate  Hair  Reviewed  Eyes  Reviewed  Mouth  Reviewed  Skin  Reviewed  Nails  Reviewed       Diet Order:   Diet Order            Diet clear liquid Room service appropriate? Yes with Assist; Fluid consistency: Thin  Diet effective now              EDUCATION NEEDS:   Not appropriate for education at this time  Skin:  Skin Assessment: Skin Integrity Issues: Skin Integrity Issues:: Incisions Incisions: closed abdomen  Last BM:  05/02/19 (via colostomy)  Height:   Ht Readings from Last 1 Encounters:  04/25/19 5\' 2"  (1.575 m)    Weight:   Wt Readings from Last 1 Encounters:  05/02/19 88 kg    Ideal Body Weight:  50 kg  BMI:  Body mass index is 35.48 kg/m.  Estimated Nutritional Needs:   Kcal:  1550-1750  Protein:  85-100 grams  Fluid:  > 1.5 L    Carlester Kasparek A. 07/02/19, RD, LDN, CDCES Registered Dietitian II Certified Diabetes Care and Education Specialist Pager: [phone] After hours Pager: [phone]

## 2019-05-03 NOTE — Consult Note (Signed)
[facility] Nurse ostomy follow up Stoma type/location: RLQ, ileostomy Stomal assessment/size: 2" round, pink, moist, budded, os at center Peristomal assessment: intact  Treatment options for stomal/peristomal skin: none,  Output black, liquid stool  Ostomy pouching: 2pc. 2 3/4" pouching system used, very close proximity to the midline incision.   Education provided: Patient continues to be confused, not appropriate for teaching.  I did explain surgical procedure and need for ostomy pouch.  She does not appear to fully comprehend teaching. She lives at home with her husband, unclear if he will be able to assist in her care. Enrolled patient in [facility] Start Discharge program: No WOC Nurse will follow along with you for continued support with ostomy teaching and care [place] MSN, [place], [place], [place], [place]

## 2019-05-03 NOTE — Plan of Care (Signed)

## 2019-05-03 NOTE — Progress Notes (Addendum)
5 Days Post-Op    CC: Recurrent/intolerable left lower quadrant abdominal pain  Subjective: Patient sitting up in bed is awake alert and quite oriented this a.m.  She knows where she is, the year, and the Software engineer.  Her midline incision looks good.  The drainage is serosanguineous.  She does have black drainage mostly liquid in the ostomy bag.  Her labs are stable.  She is tolerating the tube feeding at 20 cc/h.  She does have a little bit of nausea with it.  She seemed pretty comfortable when I was there.  She is getting IV Tylenol for pain, IV morphine, 1 mg x 3 yesterday  Objective: Vital signs in last 24 hours: Temp:  [97.5 F (36.4 C)-98.4 F (36.9 C)] 98.1 F (36.7 C) (10/05 0808) Pulse Rate:  [69-77] 69 (10/05 0015) Resp:  [18-21] 18 (10/05 0015) BP: (114-127)/(65-74) 127/69 (10/05 0015) SpO2:  [95 %-96 %] 95 % (10/05 0015) Last BM Date: 05/02/19 214 tube feeding 357 IV Urine 550 Drains 340 Stool 600 Afebrile vital signs are stable BMP stable albumin 1.6 WBC 40.4 Hemoglobin 9.8, hematocrit 27.8, platelets 342,000 CXR 9/29:  Patchy bilateral airspace opacities concerning for pneumonia; possible left pleural effusion  Intake/Output from previous day: 10/04 0701 - 10/05 0700 In: 601.7 [NG/GT:214.5; IV Piggyback:357.2] Out: 1490 [Urine:550; Drains:340; Stool:600] Intake/Output this shift: Total I/O In: -  Out: 400 [Urine:250; Drains:150]  General appearance: alert, cooperative and no distress Resp: clear to auscultation bilaterally and Anterior exam. GI: Abdomen still tender.  Midline incision looks good.  She has output through her colostomy.  Dark black tarry looking fluid though.  Ostomy looks fine.  JP drainage is serous.  Tolerating tube feeding at 20 cc/h.  Lab Results:  Recent Labs    05/02/19 0805 05/03/19 0348  WBC 49.5* 40.4*  HGB 9.1* 9.8*  HCT 26.3* 27.8*  PLT 277 342    BMET Recent Labs    05/02/19 0805 05/03/19 0348  NA 139 135  K 3.2* 4.3   CL 108 103  CO2 21* 21*  GLUCOSE 173* 82  BUN 33* 25*  CREATININE 0.95 0.75  CALCIUM 7.3* 7.6*   PT/INR No results for input(s): LABPROT, INR in the last 72 hours.  Recent Labs  Lab 04/29/19 0707 04/30/19 1501 05/01/19 0725 05/02/19 0805 05/03/19 0348  ALBUMIN 1.8* 1.7* 1.8* 1.6* 1.6*     Lipase     Component Value Date/Time   LIPASE 15 04/25/2019 0355     Medications: . sodium chloride   Intravenous Once  . Chlorhexidine Gluconate Cloth  6 each Topical Daily  . feeding supplement (VITAL HIGH PROTEIN)  1,000 mL Per Tube Q24H   . methocarbamol (ROBAXIN) IV    . piperacillin-tazobactam (ZOSYN)  IV 2.25 g (05/03/19 0659)    Assessment/Plan Sepsis Encephalopathy AKI-Cr  2.08(10/1)>>0.75(10/5) ABL anemia  -Received 2 pRBC and 1 FFP9/30, . Black tarry stool 10/4; H/H 9.4/26.8(10/1) >>9.8/27.8(10/5) Malnutrition -prealbumin pending Hx Constipation Tobacco abuse   Severe sigmoid diverticulitis with multiple pelvic abscesses; largest 11.4 x 2.4 x 2.1 POD 5,  exploratory laparotomy with sigmoid colectomy, colostomy, and Stamm gastrostomy tube placement, Dr. Brantley Stage 9/30 for expected severe diverticulitis with multiple pelvic abscesses - Pathology with diverticulitis  - Stoma viable - BID wet to dry dressing changes. Continue abdominal binder to protect G tube - monitor JP drainage - . Add IV ativan PRN anxiety  - Leukocytosis 23.0(10/1)>>45.8(10/3)>>49.5(10/4)>>40.4(10/5)  ID -cipro9/27 x1,flagyl9/27>>10/1, aztreonam 9/27>>9/28, zosyn 9/28>>9/29, rocephin 9/29>>10/1; zosyn10/1 >> (likely  will need 10-14 days total of abx, however will discuss with MD duration of abx) VTE -SCDs, Hgb stable x 72 hours (last hgb 10/3). Plt 174. Await CBC given black stools before considering starting chemical prophylaxis.  FEN -IVF, NPO, trickle tube feeds Foley -foley  Follow up -Dr. Luisa Hart Foley: In  Plan: Continue tube feeding at current rate.  I will add some  clear liquids to her diet, and see how she does with this.  She needs to be mobilized more.  She is moving about 600 on her incentive spirometry.  I told her to increase this and to do it 10 times every hour.  From our standpoint the Foley can come out, and she can have a bedside commode.  Will defer to Dr. Dairl Ponder.  She has PT, OT, out of bed, and ambulation order preop s.  Add p.o. Tylenol and tramadol for pain.  Dietitian consult consult to assist with malnutrition.  Prealbumin/CBC ordered for tomorrow.       LOS: 8 days    Deavion Strider 05/03/2019 [phone]

## 2019-05-03 NOTE — Progress Notes (Addendum)
PROGRESS NOTE    Joanne Burke  ZDG:[id number] DOB: 08-Apr-1931 DOA: 04/25/2019 PCP: Patient, No Pcp Per   Brief Narrative:  Patient is a 85 year old female with no significant past medical history who presents with abdominal pain.  She started having left lower quadrant pain about 9 days ago, decreased p.o. intake.  She has not seen a doctor for many years.  It was reported that she was in pretty good health and is ambulatory, quite independent.  When she presented she was hypertensive.  She was found to have acute kidney injury, lactic acidosis, leukocytosis.  CT imaging done in the emergency department showed possible diverticulitis, abscess versus fluid in the abdomen.  Surgery consulted.  Nephrology also following for AKI.  Underwent exploratory laparotomy with sigmoid colectomy, colostomy and gastrostomy tube placement with mobilization of splenic flexure  Assessment & Plan:   Principal Problem:   Diverticulitis of colon with perforation Active Problems:   Sepsis ([place])   Abdominal pain/diverticulitis with multiple pelvic abscesses, status post exploratory laparotomy with sigmoid colectomy, colostomy and gastrostomy tube placement with mobilization of splenic flexure (POD 3): Significantly improved clinically and seems at/near baseline mentally.  Presented with decreased oral intake, left lower quadrant abdominal pain. CT abdomen/pelvis with oral contrast  Showed potential fluid collection or abscess in the left hemipelvis,diffuse wall thickening of the sigmoid colon concerning  for diverticulitis or colitis,small volume abdominal ascites. IR was consulted earlier for possible drainage of the abscess but IR stated not a candidate for drainage. Antibiotics: cipro9/27 x1,flagyl9/27>> 10/1, aztreonam 9/27>>9/28, zosyn 9/28>>9/29, rocephin 9/29>>10/1; zosyn10/1 --> currently.  Continue following with wound care. - Today is day 9/14 of antibiotics, currently on Zosyn, WBC decreasing today  (49->40) afebrile and does not appear toxic - if patient is having increased stool output, consider testing for C diff as she has had prolonged antibiotics.  - RD consulted for enteral/tube feeding initiation - agree with void trial, will discontinue foley.  Bilateral upper extremity edema keep patient's extremities elevated at heart level  AKI : resolved. secondary to ATN from sepsis and CIN.   Nephrology signed off.  Continue to monitor.  Void trial  Non-anion gap metabolic acidosis likely from component of hyperchloremic acidosis and AKI received bicarb earlier in hospitalization.  Improvement in acidosis with D5 W.  Hypernatremia resolved  Acute respiratory failure with hypoxia: Resolved.  CXR showed patchy bilateral airspace opacities concerning for pneumonia 9/29: Could be aspiration pneumonia or pulmonary edema.  Today is day 9/14 of antibiotics, currently on Zosyn. -antibiotics as above  Debility/deconditioning: Spouse reports that she is quite ambulatory, independent and works on her yard.  Imaging showed bilateral acute to subacute sacral insufficiency fractures .Denied any hip pain. we will request for PT evaluation when appropriate. -PT  Encephalopathy likely multifactorial significantly improved today, I was able to have a conversation with the patient which I have not been able to in the past.  Suspect narcotic medication induced in setting of recent abdominal surgery and pain, hypernatremia, benzodiazepine in elderly patient with AKI. Has received multiple doses of Ativan for agitation.  - restrict any medications that can alter mental status as best possible. -Keep window shades open and reorient at bedside before giving medication     DVT prophylaxis: SCD Code Status: Full. Diet: Tube feeding with advance to clear liquids Disposition Plan: Pending medical stability.    Consultants: Surgery, nephrology  Procedures:None  Antimicrobials:  Anti-infectives (From  admission, onward)   Start     Dose/Rate Route  Frequency Ordered Stop   04/29/19 1400  piperacillin-tazobactam (ZOSYN) IVPB 2.25 g     2.25 g 100 mL/hr over 30 Minutes Intravenous Every 8 hours 04/29/19 1101     04/27/19 1000  cefTRIAXone (ROCEPHIN) 2 g in sodium chloride 0.9 % 100 mL IVPB  Status:  Discontinued     2 g 200 mL/hr over 30 Minutes Intravenous Every 24 hours 04/27/19 0859 04/29/19 1101   04/27/19 1000  metroNIDAZOLE (FLAGYL) IVPB 500 mg  Status:  Discontinued     500 mg 100 mL/hr over 60 Minutes Intravenous Every 8 hours 04/27/19 0859 04/29/19 1101   04/26/19 2200  piperacillin-tazobactam (ZOSYN) IVPB 3.375 g  Status:  Discontinued     3.375 g 12.5 mL/hr over 240 Minutes Intravenous Every 12 hours 04/26/19 1253 04/27/19 0858   04/26/19 1800  aztreonam (AZACTAM) 1 g in sodium chloride 0.9 % 100 mL IVPB  Status:  Discontinued     1 g 200 mL/hr over 30 Minutes Intravenous Every 12 hours 04/26/19 0805 04/26/19 1253   04/25/19 2030  ciprofloxacin (CIPRO) IVPB 200 mg  Status:  Discontinued     200 mg 100 mL/hr over 60 Minutes Intravenous Every 12 hours 04/25/19 1109 04/25/19 1640   04/25/19 2030  aztreonam (AZACTAM) 1 g in sodium chloride 0.9 % 100 mL IVPB  Status:  Discontinued     1 g 200 mL/hr over 30 Minutes Intravenous Every 8 hours 04/25/19 1651 04/26/19 0805   04/25/19 1730  aztreonam (AZACTAM) 2 g in sodium chloride 0.9 % 100 mL IVPB  Status:  Discontinued     2 g 200 mL/hr over 30 Minutes Intravenous  Once 04/25/19 1640 04/25/19 1651   04/25/19 1600  metroNIDAZOLE (FLAGYL) IVPB 500 mg  Status:  Discontinued     500 mg 100 mL/hr over 60 Minutes Intravenous Every 8 hours 04/25/19 1109 04/26/19 1253   04/25/19 0730  ciprofloxacin (CIPRO) IVPB 400 mg     400 mg 200 mL/hr over 60 Minutes Intravenous  Once 04/25/19 0729 04/25/19 0921   04/25/19 0730  metroNIDAZOLE (FLAGYL) IVPB 500 mg     500 mg 100 mL/hr over 60 Minutes Intravenous  Once 04/25/19 0729 04/25/19 [id number]       Subjective:  Patient admitted she felt much better today and denied complaints of malaise.  She admitted she felt uncomfortable with the abdominal binder on but understands that it is a necessity.  Denies any chest pain, shortness of breath, palpitations.  Objective: Vitals:   05/02/19 2350 05/03/19 0015 05/03/19 0808 05/03/19 1130  BP: 125/74 127/69    Pulse: 72 69    Resp: 18 18    Temp:   98.1 F (36.7 C) (!) 97.5 F (36.4 C)  TempSrc:   Oral Oral  SpO2: 96% 95%    Weight:      Height:        Intake/Output Summary (Last 24 hours) at 05/03/2019 1406 Last data filed at 05/03/2019 0935 Gross per 24 hour  Intake 601.7 ml  Output 1890 ml  Net -1288.3 ml   Filed Weights   04/25/19 1438 04/30/19 1334 05/02/19 0325  Weight: 42.9 kg 40.4 kg 88 kg    Examination:  General exam: Elderly female, frail, awake and alert and conversive HEENT: Moist oral mucosa Respiratory system: no acute respiratory distress Cardiovascular system: Sinus tachycardia, no JVD, murmurs, rubs, gallops or clicks. Gastrointestinal system: Postsurgical abdomen, colostomy with output Central nervous system: Awake and alert Extremities: Bilateral upper extremities  with edema Skin: Ecchymosis in location of IVs    Data Reviewed: I have personally reviewed following labs and imaging studies  CBC: Recent Labs  Lab 04/27/19 0240 04/28/19 1007  04/29/19 0707 04/30/19 1501 05/01/19 0725 05/02/19 0805 05/03/19 0348  WBC 20.6* 26.0*  --  23.0* 38.5* 45.8* 49.5* 40.4*  NEUTROABS 18.5* 24.4*  --  18.8*  --   --   --   --   HGB 10.4* 9.9*   < > 9.4* 9.1* 9.5* 9.1* 9.8*  HCT 31.6* 28.7*   < > 26.8* 24.9* 27.1* 26.3* 27.8*  MCV 84.5 82.7  --  86.5 86.5 87.7 89.5 87.7  PLT 144* 137*  --  95* 155 174 277 342   < > = values in this interval not displayed.   Basic Metabolic Panel: Recent Labs  Lab 04/27/19 0240  04/29/19 0707 04/30/19 1501 05/01/19 0725 05/02/19 0805 05/03/19 0348  NA 135   < >  144 148* 149* 139 135  K 4.4   < > 4.2 3.8 3.8 3.2* 4.3  CL 106   < > 115* 120* 118* 108 103  CO2 13*   < > 17* 20* 20* 21* 21*  GLUCOSE 58*   < > 139* 127* 95 173* 82  BUN 44*   < > 54* 65* 58* 33* 25*  CREATININE 2.52*   < > 2.08* 1.77* 1.55* 0.95 0.75  CALCIUM 7.8*   < > 7.1* 7.5* 7.7* 7.3* 7.6*  MG 1.7  --   --   --   --  1.8  --   PHOS 5.4*   < > 5.4* 3.1 2.8 2.0* 2.4*   < > = values in this interval not displayed.   GFR: Estimated Creatinine Clearance: 52 mL/min (by C-G formula based on SCr of 0.75 mg/dL). Liver Function Tests: Recent Labs  Lab 04/29/19 0707 04/30/19 1501 05/01/19 0725 05/02/19 0805 05/03/19 0348  ALBUMIN 1.8* 1.7* 1.8* 1.6* 1.6*   No results for input(s): LIPASE, AMYLASE in the last 168 hours. No results for input(s): AMMONIA in the last 168 hours. Coagulation Profile: Recent Labs  Lab 04/28/19 1734  INR 1.5*   Cardiac Enzymes: No results for input(s): CKTOTAL, CKMB, CKMBINDEX, TROPONINI in the last 168 hours. BNP (last 3 results) No results for input(s): PROBNP in the last 8760 hours. HbA1C: No results for input(s): HGBA1C in the last 72 hours. CBG: Recent Labs  Lab 05/02/19 2014 05/02/19 2347 05/03/19 0416 05/03/19 0733 05/03/19 1128  GLUCAP 102* 100* 99 107* 99   Lipid Profile: No results for input(s): CHOL, HDL, LDLCALC, TRIG, CHOLHDL, LDLDIRECT in the last 72 hours. Thyroid Function Tests: No results for input(s): TSH, T4TOTAL, FREET4, T3FREE, THYROIDAB in the last 72 hours. Anemia Panel: No results for input(s): VITAMINB12, FOLATE, FERRITIN, TIBC, IRON, RETICCTPCT in the last 72 hours. Sepsis Labs: Recent Labs  Lab 04/28/19 1007  LATICACIDVEN 1.8    Recent Results (from the past 240 hour(s))  SARS CORONAVIRUS 2 (TAT 6-24 HRS) Nasopharyngeal Nasopharyngeal Swab     Status: None   Collection Time: 04/25/19 10:57 AM   Specimen: Nasopharyngeal Swab  Result Value Ref Range Status   SARS Coronavirus 2 NEGATIVE NEGATIVE Final     Comment: (NOTE) SARS-CoV-2 target nucleic acids are NOT DETECTED. The SARS-CoV-2 RNA is generally detectable in upper and lower respiratory specimens during the acute phase of infection. Negative results do not preclude SARS-CoV-2 infection, do not rule out co-infections with other pathogens, and should  not be used as the sole basis for treatment or other patient management decisions. Negative results must be combined with clinical observations, patient history, and epidemiological information. The expected result is Negative. Fact Sheet for Patients: HairSlick.no Fact Sheet for Healthcare Providers: quierodirigir.com This test is not yet approved or cleared by the [place] FDA and  has been authorized for detection and/or diagnosis of SARS-CoV-2 by FDA under an Emergency Use Authorization (EUA). This EUA will remain  in effect (meaning this test can be used) for the duration of the COVID-19 declaration under Section 56 4(b)(1) of the Act, 21 U.S.C. section 360bbb-3(b)(1), unless the authorization is terminated or revoked sooner. Performed at [facility], [address] [place], [place] [address]   MRSA PCR Screening     Status: None   Collection Time: 04/25/19  2:48 PM   Specimen: Nasal Mucosa; Nasopharyngeal  Result Value Ref Range Status   MRSA by PCR NEGATIVE NEGATIVE Final    Comment:        The GeneXpert MRSA Assay (FDA approved for NASAL specimens only), is one component of a comprehensive MRSA colonization surveillance program. It is not intended to diagnose MRSA infection nor to guide or monitor treatment for MRSA infections. Performed at [facility], [address], [place], [place] [address]   Culture, blood (x 2)     Status: None   Collection Time: 04/25/19  5:05 PM   Specimen: BLOOD LEFT HAND  Result Value Ref Range Status   Specimen Description BLOOD LEFT HAND  Final   Special  Requests   Final    AEROBIC BOTTLE ONLY Blood Culture results may not be optimal due to an inadequate volume of blood received in culture bottles   Culture   Final    NO GROWTH 5 DAYS Performed at [facility], [address] [place], [place] [address]    Report Status 04/30/2019 FINAL  Final  Culture, blood (x 2)     Status: None   Collection Time: 04/25/19  5:12 PM   Specimen: BLOOD RIGHT HAND  Result Value Ref Range Status   Specimen Description BLOOD RIGHT HAND  Final   Special Requests AEROBIC BOTTLE ONLY Blood Culture adequate volume  Final   Culture   Final    NO GROWTH 5 DAYS Performed at [facility], [address], [place], [place] [address]    Report Status 04/30/2019 FINAL  Final         Radiology Studies: No results found.      Scheduled Meds: . sodium chloride   Intravenous Once  . acetaminophen  1,000 mg Oral Q8H  . Chlorhexidine Gluconate Cloth  6 each Topical Daily  . feeding supplement  1 Container Oral TID BM  . feeding supplement (VITAL HIGH PROTEIN)  1,000 mL Per Tube Q24H   Continuous Infusions: . methocarbamol (ROBAXIN) IV    . piperacillin-tazobactam (ZOSYN)  IV 2.25 g (05/03/19 0659)     LOS: 8 days    Time spent: 30 mins. More than 50% of that time was spent in counseling and/or coordination of care.      Jae Dire, DO [facility] Pager [phone]  If 7PM-7AM, please contact night-coverage [email or web address] Password [email or web address] 05/03/2019, 2:06 PM

## 2019-05-03 NOTE — Evaluation (Signed)
Physical Therapy Evaluation Patient Details Name: TIKESHA MORT MRN: [id number] DOB: [birth year] Today's Date: 05/03/2019   History of Present Illness  85 y.o. female with no significant medical history presenting with abdominal pain. Pt admitted for diverticulitis of colon with perforation, sepsis. On 04/28/19 she underwent sigmoid colectomy, colostomy and Stamm gastrostomy tube placement.    Clinical Impression  Pt admitted with above diagnosis. PTA pt lived at home with husband, active and independent. On eval she required mod assist bed mobility. Eval/mobility significantly limited by pain. Pt unable to progress beyond EOB.  Pt currently with functional limitations due to the deficits listed below (see PT Problem List). Pt will benefit from skilled PT to increase their independence and safety with mobility to allow discharge to the venue listed below.       Follow Up Recommendations SNF    Equipment Recommendations  Rolling walker with 5" wheels    Recommendations for Other Services       Precautions / Restrictions Precautions Precautions: Fall;Other (comment) Precaution Comments: bulb drain, colostomy, feeding tube Restrictions Weight Bearing Restrictions: No      Mobility  Bed Mobility Overal bed mobility: Needs Assistance Bed Mobility: Rolling;Sidelying to Sit;Sit to Sidelying Rolling: Mod assist Sidelying to sit: Mod assist;HOB elevated     Sit to sidelying: Mod assist;HOB elevated General bed mobility comments: +rail, cues for sequencing. Pt required immediate return to supine due to pain.  Transfers                 General transfer comment: unable due to pain. Pt reports she transfered to the bathroom with nursing yesterday. Unsure of the validity of the information as pt has been confused.  Ambulation/Gait             General Gait Details: unable  Stairs            Wheelchair Mobility    Modified Rankin (Stroke Patients Only)        Balance                                             Pertinent Vitals/Pain Pain Assessment: 0-10 Pain Score: 9  Pain Location: abdomen Pain Descriptors / Indicators: Grimacing;Guarding;Discomfort;Moaning Pain Intervention(s): Limited activity within patient's tolerance;Repositioned;Patient requesting pain meds-RN notified    Home Living Family/patient expects to be discharged to:: Private residence Living Arrangements: Spouse/significant other Available Help at Discharge: Family;Available 24 hours/day Type of Home: House Home Access: Ramped entrance     Home Layout: One level Home Equipment: None      Prior Function Level of Independence: Independent         Comments: Active, driving, mowing the yard     Hand Dominance        Extremity/Trunk Assessment   Upper Extremity Assessment Upper Extremity Assessment: Generalized weakness    Lower Extremity Assessment Lower Extremity Assessment: Generalized weakness       Communication   Communication: No difficulties  Cognition Arousal/Alertness: Awake/alert Behavior During Therapy: WFL for tasks assessed/performed Overall Cognitive Status: Impaired/Different from baseline Area of Impairment: Orientation;Memory;Following commands;Problem solving                 Orientation Level: Disoriented to;Time   Memory: Decreased short-term memory Following Commands: Follows one step commands with increased time     Problem Solving: Slow processing;Difficulty sequencing General Comments: mentation improving. Aware  that she is confused.      General Comments General comments (skin integrity, edema, etc.): Pt feels abdominal binder is too tight but states the doctor told her it needed to be that tight.    Exercises     Assessment/Plan    PT Assessment Patient needs continued PT services  PT Problem List Decreased strength;Decreased mobility;Decreased activity tolerance;Decreased  cognition;Pain;Decreased knowledge of use of DME;Decreased balance       PT Treatment Interventions DME instruction;Therapeutic activities;Gait training;Therapeutic exercise;Patient/family education;Balance training;Functional mobility training    PT Goals (Current goals can be found in the Care Plan section)  Acute Rehab PT Goals Patient Stated Goal: decrease pain PT Goal Formulation: With patient Time For Goal Achievement: 05/17/19 Potential to Achieve Goals: Good    Frequency Min 3X/week   Barriers to discharge        Co-evaluation               AM-PAC PT "6 Clicks" Mobility  Outcome Measure Help needed turning from your back to your side while in a flat bed without using bedrails?: A Lot Help needed moving from lying on your back to sitting on the side of a flat bed without using bedrails?: A Lot Help needed moving to and from a bed to a chair (including a wheelchair)?: A Lot Help needed standing up from a chair using your arms (e.g., wheelchair or bedside chair)?: A Lot Help needed to walk in hospital room?: Total Help needed climbing 3-5 steps with a railing? : Total 6 Click Score: 10    End of Session   Activity Tolerance: Patient limited by pain Patient left: in bed;with call bell/phone within reach;with bed alarm set Nurse Communication: Mobility status PT Visit Diagnosis: Other abnormalities of gait and mobility (R26.89);Pain;Muscle weakness (generalized) (M62.81)    Time: 1010-1026 PT Time Calculation (min) (ACUTE ONLY): 16 min   Charges:   PT Evaluation $PT Eval Moderate Complexity: 1 Mod          Lorrin Goodell, PT  Office # [phone] Pager [phone]   Lorriane Shire 05/03/2019, 10:56 AM

## 2019-05-03 NOTE — Progress Notes (Signed)
Ambulated patient to chair. Tolerated ambulation poorly and was picked up into chair. Patient stayed in the chair for 15 minutes and experienced excruciating pain.    Oneita Kras, RN

## 2019-05-04 LAB — CBC
HCT: 25.8 % — ABNORMAL LOW (ref 36.0–46.0)
Hemoglobin: 8.8 g/dL — ABNORMAL LOW (ref 12.0–15.0)
MCH: 30.3 pg (ref 26.0–34.0)
MCHC: 34.1 g/dL (ref 30.0–36.0)
MCV: 89 fL (ref 80.0–100.0)
Platelets: 435 10*3/uL — ABNORMAL HIGH (ref 150–400)
RBC: 2.9 MIL/uL — ABNORMAL LOW (ref 3.87–5.11)
RDW: 18.1 % — ABNORMAL HIGH (ref 11.5–15.5)
WBC: 35 10*3/uL — ABNORMAL HIGH (ref 4.0–10.5)
nRBC: 0.2 % (ref 0.0–0.2)

## 2019-05-04 LAB — RENAL FUNCTION PANEL
Albumin: 1.6 g/dL — ABNORMAL LOW (ref 3.5–5.0)
Anion gap: 8 (ref 5–15)
BUN: 19 mg/dL (ref 8–23)
CO2: 21 mmol/L — ABNORMAL LOW (ref 22–32)
Calcium: 7.2 mg/dL — ABNORMAL LOW (ref 8.9–10.3)
Chloride: 105 mmol/L (ref 98–111)
Creatinine, Ser: 0.62 mg/dL (ref 0.44–1.00)
GFR calc Af Amer: >60 mL/min (ref >60)
GFR calc non Af Amer: >60 mL/min (ref >60)
Glucose, Bld: 85 mg/dL (ref 70–99)
Phosphorus: 3.1 mg/dL (ref 2.5–4.6)
Potassium: 3.8 mmol/L (ref 3.5–5.1)
Sodium: 134 mmol/L — ABNORMAL LOW (ref 135–145)

## 2019-05-04 LAB — GLUCOSE, CAPILLARY
Glucose-Capillary: 122 mg/dL — ABNORMAL HIGH (ref 70–99)
Glucose-Capillary: 83 mg/dL (ref 70–99)
Glucose-Capillary: 96 mg/dL (ref 70–99)

## 2019-05-04 LAB — PREALBUMIN: Prealbumin: 20.9 mg/dL (ref 18–38)

## 2019-05-04 MED ORDER — ADULT MULTIVITAMIN W/MINERALS CH
1.0000 | ORAL_TABLET | Freq: Every day | ORAL | Status: DC
  Administered 2019-05-04 – 2019-05-16 (×13): 1 via ORAL
  Filled 2019-05-04 (×13): qty 1

## 2019-05-04 MED ORDER — PIPERACILLIN-TAZOBACTAM 3.375 G IVPB
3.3750 g | Freq: Three times a day (TID) | INTRAVENOUS | Status: DC
  Administered 2019-05-04 – 2019-05-07 (×9): 3.375 g via INTRAVENOUS
  Filled 2019-05-04 (×9): qty 50

## 2019-05-04 MED ORDER — ENSURE ENLIVE PO LIQD
237.0000 mL | Freq: Three times a day (TID) | ORAL | Status: DC
  Administered 2019-05-04 – 2019-05-06 (×3): 237 mL via ORAL

## 2019-05-04 MED ORDER — SACCHAROMYCES BOULARDII 250 MG PO CAPS
250.0000 mg | ORAL_CAPSULE | Freq: Two times a day (BID) | ORAL | Status: DC
  Administered 2019-05-04 – 2019-05-16 (×25): 250 mg via ORAL
  Filled 2019-05-04 (×22): qty 1

## 2019-05-04 MED ORDER — METHOCARBAMOL 500 MG PO TABS
500.0000 mg | ORAL_TABLET | Freq: Three times a day (TID) | ORAL | Status: DC
  Administered 2019-05-04 – 2019-05-16 (×33): 500 mg via ORAL
  Filled 2019-05-04 (×35): qty 1

## 2019-05-04 NOTE — Evaluation (Signed)
Occupational Therapy Evaluation Patient Details Name: Joanne Burke MRN: [id number] DOB: [birth year] Today's Date: 05/04/2019    History of Present Illness 85 y.o. female with no significant medical history presenting with abdominal pain. Pt admitted for diverticulitis of colon with perforation, sepsis. On 04/28/19 she underwent sigmoid colectomy, colostomy and Stamm gastrostomy tube placement.   Clinical Impression   Pt admitted with the above.   Pt able to get to chair this day and she was Very proud of herself.  Pt understands she will need A at home so will need SNF. Pt currently with functional limitations due to the deficits listed below (see OT Problem List).  Pt will benefit from skilled OT to increase their safety and independence with ADL and functional mobility for ADL to facilitate discharge to venue listed below.      Follow Up Recommendations  SNF    Equipment Recommendations  None recommended by OT    Recommendations for Other Services       Precautions / Restrictions Precautions Precautions: Fall Precaution Comments: bulb drain, colostomy, feeding tube Restrictions Weight Bearing Restrictions: No      Mobility Bed Mobility Overal bed mobility: Needs Assistance Bed Mobility: Rolling;Sidelying to Sit;Sit to Sidelying Rolling: Mod assist Sidelying to sit: Mod assist;HOB elevated          Transfers Overall transfer level: Needs assistance Equipment used: 1 person hand held assist Transfers: Sit to/from [facility] Sit to Stand: Mod assist Stand pivot transfers: Mod assist       General transfer comment: needed encouragement but was very proud of getting to chair!    Balance Overall balance assessment: Needs assistance Sitting-balance support: Single extremity supported Sitting balance-Leahy Scale: Fair     Standing balance support: Single extremity supported Standing balance-Leahy Scale: Poor                            ADL either performed or assessed with clinical judgement   ADL Overall ADL's : Needs assistance/impaired Eating/Feeding: Minimal assistance;Sitting   Grooming: Minimal assistance;Sitting   Upper Body Bathing: Minimal assistance;Sitting   Lower Body Bathing: Maximal assistance;Sit to/from stand;Cueing for safety;Cueing for compensatory techniques;Cueing for sequencing;+2 for physical assistance;+2 for safety/equipment   Upper Body Dressing : Minimal assistance;Sitting   Lower Body Dressing: Maximal assistance;Sit to/from stand;Cueing for safety;+2 for physical assistance;+2 for safety/equipment   Toilet Transfer: Maximal assistance;Stand-pivot;Cueing for safety;Cueing for sequencing Toilet Transfer Details (indicate cue type and reason): bed to chair                              Pertinent Vitals/Pain Pain Score: 5  Pain Location: abdomen Pain Descriptors / Indicators: Grimacing;Guarding;Discomfort Pain Intervention(s): Limited activity within patient's tolerance;Monitored during session;Repositioned     Hand Dominance     Extremity/Trunk Assessment Upper Extremity Assessment Upper Extremity Assessment: Generalized weakness           Communication Communication Communication: No difficulties   Cognition Arousal/Alertness: Awake/alert Behavior During Therapy: WFL for tasks assessed/performed Overall Cognitive Status: Within Functional Limits for tasks assessed                                                Home Living Family/patient expects to be discharged to:: Skilled nursing facility Living Arrangements:  Spouse/significant other Available Help at Discharge: Family;Available 24 hours/day Type of Home: House Home Access: Ramped entrance     Home Layout: One level         Bathroom Toilet: Standard     Home Equipment: None          Prior Functioning/Environment Level of Independence: Independent        Comments:  Active, driving, mowing the yard        OT Problem List: Decreased strength;Decreased activity tolerance;Impaired balance (sitting and/or standing);Decreased safety awareness;Decreased knowledge of use of DME or AE;Pain      OT Treatment/Interventions: Self-care/ADL training;Patient/family education;DME and/or AE instruction;Therapeutic activities    OT Goals(Current goals can be found in the care plan section) Acute Rehab OT Goals Patient Stated Goal: decrease pain OT Goal Formulation: With patient Time For Goal Achievement: 05/18/19 Potential to Achieve Goals: Good ADL Goals Pt Will Perform Grooming: with supervision;standing Pt Will Perform Lower Body Dressing: with min assist;sit to/from stand Pt Will Transfer to Toilet: with min assist;bedside commode Pt Will Perform Toileting - Clothing Manipulation and hygiene: with supervision;sit to/from stand  OT Frequency: Min 2X/week   Barriers to Burke/C: Decreased caregiver support             AM-PAC OT "6 Clicks" Daily Activity     Outcome Measure Help from another person eating meals?: A Little Help from another person taking care of personal grooming?: A Little Help from another person toileting, which includes using toliet, bedpan, or urinal?: A Lot Help from another person bathing (including washing, rinsing, drying)?: A Lot Help from another person to put on and taking off regular upper body clothing?: A Little Help from another person to put on and taking off regular lower body clothing?: A Lot 6 Click Score: 15   End of Session Equipment Utilized During Treatment: Rolling walker  Activity Tolerance: Patient tolerated treatment well Patient left: in chair;with call bell/phone within reach;with chair alarm set;with nursing/sitter in room  OT Visit Diagnosis: Unsteadiness on feet (R26.81);Other abnormalities of gait and mobility (R26.89);Muscle weakness (generalized) (M62.81);History of falling (Z91.81)                Time:  3662-9476 OT Time Calculation (min): 25 min Charges:  OT General Charges $OT Visit: 1 Visit OT Evaluation $OT Eval Moderate Complexity: 1 Mod OT Treatments $Self Care/Home Management : 8-22 mins  Lise Auer, OT Acute Rehabilitation Services [phone] Office- [phone]     Joanne Burke, Joanne Burke 05/04/2019, 3:24 PM

## 2019-05-04 NOTE — Progress Notes (Addendum)
6 Days Post-Op    CC: Recurrent/intolerable left lower quadrant abdominal pain  Subjective: Patient seems clear this a.m.  She remembers drinking a lot of fluids and some soup for supper last evening.  She remembers Jell-O this a.m. but not anything else.  Her ostomy looks good and she fluid in her ostomy bag.  Currently physical therapy is recommending skilled nursing facility. Objective: Vital signs in last 24 hours: Temp:  [97.5 F (36.4 C)-98 F (36.7 C)] 98 F (36.7 C) (10/06 0416) Pulse Rate:  [77-80] 78 (10/06 0843) Resp:  [15-20] 15 (10/06 0413) BP: (128-133)/(67-73) 133/70 (10/06 0413) SpO2:  [97 %-99 %] 99 % (10/06 0843) Weight:  [87 kg] 87 kg (10/06 0544) Last BM Date: 05/02/19 50 p.o. recorded 195 IV recorded 650 urine Drain to 60 BM x1 Afebrile vital signs are stable Sodium is 134 creatinine 0.62 WBC 35,000 H/H8 0./25.8 Pain: Tylenol 1 g IV x2 yesterday, Tylenol 1 g p.o. x2 morphine 1 mg x 2 and 2 mg x 1 none listed since 1639 yesterday all tramadol x1 yesterday  Intake/Output from previous day: 10/05 0701 - 10/06 0700 In: 445.4 [P.O.:50; IV Piggyback:195.4] Out: 911 [Urine:650; Drains:260; Stool:1] Intake/Output this shift: No intake/output data recorded.  General appearance: alert, cooperative and no distress Resp: clear to auscultation bilaterally and She told me she is working on her incentive spirometry GI: Soft, sore, midline incision looks good.  Ostomy is pink with output from the ostomy.  The IR drain is serosanguineous.  Her gastrostomy tube looks fine and she is tolerating tube feedings at 20 cc/h.  Lab Results:  Recent Labs    05/03/19 0348 05/04/19 0210  WBC 40.4* 35.0*  HGB 9.8* 8.8*  HCT 27.8* 25.8*  PLT 342 435*    BMET Recent Labs    05/03/19 0348 05/04/19 0210  NA 135 134*  K 4.3 3.8  CL 103 105  CO2 21* 21*  GLUCOSE 82 85  BUN 25* 19  CREATININE 0.75 0.62  CALCIUM 7.6* 7.2*   PT/INR No results for input(s): LABPROT,  INR in the last 72 hours.  Recent Labs  Lab 04/30/19 1501 05/01/19 0725 05/02/19 0805 05/03/19 0348 05/04/19 0210  ALBUMIN 1.7* 1.8* 1.6* 1.6* 1.6*     Lipase     Component Value Date/Time   LIPASE 15 04/25/2019 0355     Medications: . sodium chloride   Intravenous Once  . acetaminophen  1,000 mg Oral Q8H  . Chlorhexidine Gluconate Cloth  6 each Topical Daily  . feeding supplement  1 Container Oral TID BM  . feeding supplement (VITAL HIGH PROTEIN)  1,000 mL Per Tube Q24H   . methocarbamol (ROBAXIN) IV    . piperacillin-tazobactam (ZOSYN)  IV 2.25 g (05/04/19 0505)    Assessment/Plan Sepsis Encephalopathy AKI-Cr  2.08(10/1)>>0.75(10/5) >> renal is signing off. ABL anemia  -Received 2 pRBC and 1 FFP9/30,. Black tarry stool 10/4;H/H 9.4/26.8(10/1) >>9.8/27.8(10/5) Malnutrition -prealbumin 20.9  Hx Constipation Tobacco abuse   Severe sigmoid diverticulitis with multiple pelvic abscesses; largest 11.4 x 2.4 x 2.1 POD#6, exploratory laparotomy with sigmoid colectomy, colostomy, and Stamm gastrostomy tube placement, Dr. Luisa Hart 9/30 for expected severe diverticulitis with multiple pelvic abscesses -Pathology with diverticulitis -Stoma viable - BID wet to dry dressing changes. Continue abdominal binder to protect G tube - monitor JP drainage -. Add IV ativan PRN anxiety  - Leukocytosis 23.0(10/1)>>45.8(10/3)>>49.5(10/4)>>40.4(10/5)>>35.0(10/6)  ID -cipro9/27 x1,flagyl9/27>>10/1, aztreonam 9/27>>9/28, zosyn 9/28>>9/29, rocephin 9/29>>10/1; zosyn10/1 >>(likely will need 10-14 days total of abx,  however will discuss with MD duration of abx) VTE -SCDs, Hgb stable x 72 hours (last hgb 10/3). Plt 174. Await CBC given black stools before considering starting chemical prophylaxis. FEN -IVF, clear liquids, trickle tube feeds Foley -foley  Follow up -Dr. Brantley Stage Foley: In   Plan: I will leave the tube feedings at 20 cc/h put her on a soft diet.   Continue to mobilize.  She said she had a lot of pain when she got up in the chair.  But she is not getting very much for pain.  Her leukocytosis is improving.  I added probiotic today.  Recheck CBC tomorrow.  She is on daily BMPs.       LOS: 9 days    Joanne Burke 05/04/2019 [phone]

## 2019-05-04 NOTE — Progress Notes (Addendum)
Nutrition Follow-up  RD working remotely.  DOCUMENTATION CODES:   Obesity unspecified  INTERVENTION:   -D/c Boost Breeze po TID, each supplement provides 250 kcal and 9 grams of protein -MVI with minerals daily -Ensure Enlive po TID, each supplement provides 350 kcal and 20 grams of protein -RD will follow for diet advancement and adjust supplement as appropriate -Continue trickle feedings of Vital High Protein @ 20 ml/hr via g-tube  Tube feeding regimen provides 480 kcal (31% of needs), 42 grams of protein, and 401 ml of H2O.   -If TF is advanced, initiate Vital 1.2 @ 20 ml/hr via g-tube and increase by 10 ml every 8 hours to goal rate of 55 ml/hr.   Tube feeding regimen provides 1584 kcal (100% of needs), 99 grams of protein, and 1071 ml of H2O.   NUTRITION DIAGNOSIS:   Increased nutrient needs related to post-op healing as evidenced by estimated needs.  Ongoing  GOAL:   Patient will meet greater than or equal to 90% of their needs  Progressing   MONITOR:   PO intake, Supplement acceptance, Labs, Weight trends, TF tolerance, Skin, I & O's  REASON FOR ASSESSMENT:   Consult Assessment of nutrition requirement/status, Enteral/tube feeding initiation and management  ASSESSMENT:   Joanne Burke is a 85 y.o. female with no significant medical history presenting with abdominal pain.   She had abdominal pain in the summer and it resolved; she had swelling at the time.  About 9 days ago, she started with LLQ pain with n/v, decreased PO.  She has not been to the doctor in years but the pain was finally so bad that she had to come.  No fevers.  9/30- s/p Procedure: Exporter laparotomy with sigmoid colectomy, colostomy and Stamm gastrostomy tube placement Mobilization of splenic flexure 10/4- trickle TF started 10/5- advanced to clear liquids 10/6- advanced to full liquids  Reviewed I/O's: -466 ml x 24 hours and +7.1 L since admission  UOP: 650 ml x 24 hours  Drain  output: 260 ml x 24 hours  Trickle feedings of Vital High Protein continue @ 20 ml/hr infusing via g-tube. Regimen providing 480 kcal (31% of needs), 42 grams of protein, and 401 ml of H2O. Per general surgery notes, no plans to advance TF today.   Pt tolerating liquids well, however, no meal completion records documented to assess. Pt has accepted some Boost Breeze supplements.   Labs reviewed: Na: 134, CBGS: 83-148.   Diet Order:   Diet Order            Diet full liquid Room service appropriate? Yes; Fluid consistency: Thin  Diet effective now              EDUCATION NEEDS:   Not appropriate for education at this time  Skin:  Skin Assessment: Skin Integrity Issues: Skin Integrity Issues:: Incisions Incisions: closed abdomen  Last BM:  05/04/19 (1 ml output via colostomy)  Height:   Ht Readings from Last 1 Encounters:  04/25/19 5\' 2"  (1.575 m)    Weight:   Wt Readings from Last 1 Encounters:  05/04/19 87 kg    Ideal Body Weight:  50 kg  BMI:  Body mass index is 35.08 kg/m.  Estimated Nutritional Needs:   Kcal:  1550-1750  Protein:  85-100 grams  Fluid:  > 1.5 L    Florrie Ramires A. Jimmye Norman, RD, LDN, [place] Registered Dietitian II Certified Diabetes Care and Education Specialist Pager: [phone] After hours Pager: [phone]

## 2019-05-04 NOTE — NC FL2 (Signed)
[place] MEDICAID FL2 LEVEL OF CARE SCREENING TOOL     IDENTIFICATION  Patient Name: Joanne Burke Birthdate: 01/06/1931 Sex: female Admission Date (Current Location): 04/25/2019  [facility] and [place] Number:  Producer, television/film/video and Address:  [facility], [address], [place], [place] [address]      Provider Number: [id number]  Attending Physician Name and Address:  Jae Dire, MD  Relative Name and Phone Number:  Chrissie Noa spouse, [phone]    Current Level of Care: Hospital Recommended Level of Care: Skilled Nursing Facility Prior Approval Number:    Date Approved/Denied:   PASRR Number: [id number]  Discharge Plan: SNF    Current Diagnoses: Patient Active Problem List   Diagnosis Date Noted  . Diverticulitis of colon with perforation 04/25/2019  . Sepsis (HCC) 04/25/2019    Orientation RESPIRATION BLADDER Height & Weight     Self, Time, Situation, Place  Normal Incontinent Weight: 191 lb 12.8 oz (87 kg) Height:  5\' 2"  (157.5 cm)  BEHAVIORAL SYMPTOMS/MOOD NEUROLOGICAL BOWEL NUTRITION STATUS      Continent, Colostomy Feeding tube  AMBULATORY STATUS COMMUNICATION OF NEEDS Skin   Extensive Assist Verbally Surgical wounds(Closed incision on abdomen)                       Personal Care Assistance Level of Assistance  Bathing, Feeding, Dressing Bathing Assistance: Maximum assistance Feeding assistance: Limited assistance Dressing Assistance: Limited assistance     Functional Limitations Info  Sight, Hearing, Speech Sight Info: Adequate Hearing Info: Impaired Speech Info: Adequate    SPECIAL CARE FACTORS FREQUENCY  PT (By licensed PT), OT (By licensed OT)     PT Frequency: 5x OT Frequency: 5x            Contractures Contractures Info: Not present    Additional Factors Info  Code Status, Allergies Code Status Info: Full Allergies Info: Codeine           Current Medications (05/04/2019):  This is the  current hospital active medication list Current Facility-Administered Medications  Medication Dose Route Frequency Provider Last Rate Last Dose  . 0.9 %  sodium chloride infusion (Manually program via Guardrails IV Fluids)   Intravenous Once Cornett, Thomas, MD      . acetaminophen (TYLENOL) tablet 1,000 mg  1,000 mg Oral Q8H 07/04/2019, PA-C   1,000 mg at 05/04/19 1223  . Chlorhexidine Gluconate Cloth 2 % PADS 6 each  6 each Topical Daily Rayburn, Kelly A, PA-C   6 each at 05/04/19 1000  . dextrose 50 % solution 50 mL  1 ampule Intravenous PRN Rayburn, Kelly A, PA-C   50 mL at 04/27/19 2335  . feeding supplement (BOOST / RESOURCE BREEZE) liquid 1 Container  1 Container Oral TID BM 2336, MD   1 Container at 05/04/19 [phone]  . feeding supplement (VITAL HIGH PROTEIN) liquid 1,000 mL  1,000 mL Per Tube Q24H 3903, PA-C   1,000 mL at 05/03/19 1239  . ipratropium-albuterol (DUONEB) 0.5-2.5 (3) MG/3ML nebulizer solution 3 mL  3 mL Nebulization Q6H PRN 07/03/19, MD      . lip balm (CARMEX) ointment   Topical PRN Jonah Blue, RN      . LORazepam (ATIVAN) injection 0.5 mg  0.5 mg Intravenous Q6H PRN Meuth, Brooke A, PA-C   0.5 mg at 04/30/19 1530  . methocarbamol (ROBAXIN) tablet 500 mg  500 mg Oral TID 06/30/19,  PA-C   500 mg at 05/04/19 1223  . morphine 2 MG/ML injection 1-2 mg  1-2 mg Intravenous Q2H PRN Earnstine Regal, PA-C   2 mg at 05/03/19 1639  . piperacillin-tazobactam (ZOSYN) IVPB 2.25 g  2.25 g Intravenous Q8H Saverio Danker, PA-C 100 mL/hr at 05/04/19 0505 2.25 g at 05/04/19 0505  . saccharomyces boulardii (FLORASTOR) capsule 250 mg  250 mg Oral BID Earnstine Regal, PA-C   250 mg at 05/04/19 1223  . traMADol (ULTRAM) tablet 50 mg  50 mg Oral Q8H PRN Earnstine Regal, PA-C   50 mg at 05/03/19 1542     Discharge Medications: Please see discharge summary for a list of discharge medications.  Relevant Imaging Results:  Relevant Lab  Results:   Additional Information SSN: [id number]            COVID negative on 9/27  Benard Halsted, [place]

## 2019-05-04 NOTE — Progress Notes (Addendum)
PROGRESS NOTE    Joanne Burke  HEN:[id number] DOB: Mar 29, 1931 DOA: 04/25/2019 PCP: Patient, No Pcp Per   Brief Narrative:  Patient is a 85 year old female with no significant past medical history who presents with abdominal pain.  She started having left lower quadrant pain about 9 days ago, decreased p.o. intake.  She has not seen a doctor for many years.  It was reported that she was in pretty good health and is ambulatory, quite independent.  When she presented she was hypertensive.  She was found to have acute kidney injury, lactic acidosis, leukocytosis.  CT imaging done in the emergency department showed possible diverticulitis, abscess versus fluid in the abdomen.  Surgery consulted.  Nephrology also following for AKI.  Underwent exploratory laparotomy with sigmoid colectomy, colostomy and gastrostomy tube placement with mobilization of splenic flexure  Assessment & Plan:   Principal Problem:   Diverticulitis of colon with perforation Active Problems:   Sepsis (HCC)   Abdominal pain/diverticulitis with multiple pelvic abscesses, status post exploratory laparotomy with sigmoid colectomy, colostomy and gastrostomy tube placement with mobilization of splenic flexure (POD 3): Significantly improved clinically and seems at/near baseline mentally.  Presented with decreased oral intake, left lower quadrant abdominal pain. CT abdomen/pelvis with oral contrast  Showed potential fluid collection or abscess in the left hemipelvis,diffuse wall thickening of the sigmoid colon concerning  for diverticulitis or colitis,small volume abdominal ascites. IR was consulted earlier for possible drainage of the abscess but IR stated not a candidate for drainage. Antibiotics: cipro9/27 x1,flagyl9/27>> 10/1, aztreonam 9/27>>9/28, zosyn 9/28>>9/29, rocephin 9/29>>10/1; zosyn10/1 --> currently.  Continue following with wound care. - Today is day 10 of antibiotics, currently on Zosyn, WBC decreasing afebrile and  does not appear toxic - if patient is having increased stool output ostomy, consider testing for C diff as she has had prolonged antibiotics.  -Nutrition per RD -PT on board  Bilateral upper extremity edema improving.  Keep patient's extremities elevated at heart level  AKI : resolved. secondary to ATN from sepsis and CIN.   Nephrology signed off.  Successfully passed voiding trial  Non-anion gap metabolic acidosis likely from component of hyperchloremic acidosis and AKI received bicarb earlier in hospitalization.  Improvement in acidosis with D5 W.  Hypernatremia resolved  Acute respiratory failure with hypoxia: Resolved.  CXR showed patchy bilateral airspace opacities concerning for pneumonia 9/29: Could be aspiration pneumonia or pulmonary edema.  Today is day 9/14 of antibiotics, currently on Zosyn. -antibiotics as above  Debility/deconditioning: Spouse reports that she is quite ambulatory, independent and works on her yard.  Imaging showed bilateral acute to subacute sacral insufficiency fractures .Denied any hip pain. we will request for PT evaluation when appropriate. -PT  Encephalopathy likely multifactorial significantly improved today, I was able to have a conversation with the patient which I have not been able to in the past.  Suspect narcotic medication induced in setting of recent abdominal surgery and pain, hypernatremia, benzodiazepine in elderly patient with AKI. Has received multiple doses of Ativan for agitation.  - restrict any medications that can alter mental status as best possible. -Keep window shades open and reorient at bedside before giving medication     DVT prophylaxis: SCD Code Status: Full. Diet: Per RT Discussed with spouse over the phone Disposition Plan: Pending medical stability.    Consultants: Surgery, nephrology  Procedures:None  Antimicrobials:  Anti-infectives (From admission, onward)   Start     Dose/Rate Route Frequency Ordered Stop    05/04/19 1400  piperacillin-tazobactam (  ZOSYN) IVPB 3.375 g     3.375 g 12.5 mL/hr over 240 Minutes Intravenous Every 8 hours 05/04/19 1304     04/29/19 1400  piperacillin-tazobactam (ZOSYN) IVPB 2.25 g  Status:  Discontinued     2.25 g 100 mL/hr over 30 Minutes Intravenous Every 8 hours 04/29/19 1101 05/04/19 1304   04/27/19 1000  cefTRIAXone (ROCEPHIN) 2 g in sodium chloride 0.9 % 100 mL IVPB  Status:  Discontinued     2 g 200 mL/hr over 30 Minutes Intravenous Every 24 hours 04/27/19 0859 04/29/19 1101   04/27/19 1000  metroNIDAZOLE (FLAGYL) IVPB 500 mg  Status:  Discontinued     500 mg 100 mL/hr over 60 Minutes Intravenous Every 8 hours 04/27/19 0859 04/29/19 1101   04/26/19 2200  piperacillin-tazobactam (ZOSYN) IVPB 3.375 g  Status:  Discontinued     3.375 g 12.5 mL/hr over 240 Minutes Intravenous Every 12 hours 04/26/19 1253 04/27/19 0858   04/26/19 1800  aztreonam (AZACTAM) 1 g in sodium chloride 0.9 % 100 mL IVPB  Status:  Discontinued     1 g 200 mL/hr over 30 Minutes Intravenous Every 12 hours 04/26/19 0805 04/26/19 1253   04/25/19 2030  ciprofloxacin (CIPRO) IVPB 200 mg  Status:  Discontinued     200 mg 100 mL/hr over 60 Minutes Intravenous Every 12 hours 04/25/19 1109 04/25/19 1640   04/25/19 2030  aztreonam (AZACTAM) 1 g in sodium chloride 0.9 % 100 mL IVPB  Status:  Discontinued     1 g 200 mL/hr over 30 Minutes Intravenous Every 8 hours 04/25/19 1651 04/26/19 0805   04/25/19 1730  aztreonam (AZACTAM) 2 g in sodium chloride 0.9 % 100 mL IVPB  Status:  Discontinued     2 g 200 mL/hr over 30 Minutes Intravenous  Once 04/25/19 1640 04/25/19 1651   04/25/19 1600  metroNIDAZOLE (FLAGYL) IVPB 500 mg  Status:  Discontinued     500 mg 100 mL/hr over 60 Minutes Intravenous Every 8 hours 04/25/19 1109 04/26/19 1253   04/25/19 0730  ciprofloxacin (CIPRO) IVPB 400 mg     400 mg 200 mL/hr over 60 Minutes Intravenous  Once 04/25/19 0729 04/25/19 0921   04/25/19 0730  metroNIDAZOLE  (FLAGYL) IVPB 500 mg     500 mg 100 mL/hr over 60 Minutes Intravenous  Once 04/25/19 0729 04/25/19 0924      Subjective:  Seen and examined at bedside no acute distress and resting comfortably.  She states that she feels better today.  Patient was assisted to the chair last night but had severe pain with this.  Denies any complaints such as chest pain, palpitations, nausea, vomiting, diarrhea or increased output from ostomy.  Objective: Vitals:   05/04/19 0544 05/04/19 0843 05/04/19 1227 05/04/19 1244  BP:   134/72   Pulse:  78  91  Resp:      Temp:   98.6 F (37 C)   TempSrc:   Oral   SpO2:  99%  100%  Weight: 87 kg     Height:        Intake/Output Summary (Last 24 hours) at 05/04/2019 1943 Last data filed at 05/04/2019 1806 Gross per 24 hour  Intake 145.43 ml  Output 521 ml  Net -375.57 ml   Filed Weights   04/30/19 1334 05/02/19 0325 05/04/19 0544  Weight: 40.4 kg 88 kg 87 kg    Examination:  General exam: Elderly female, frail, awake and alert and conversive HEENT: Moist oral mucosa Respiratory  system: no acute respiratory distress Cardiovascular system: Sinus tachycardia, no JVD, murmurs, rubs, gallops or clicks. Gastrointestinal system: Postsurgical abdomen, colostomy with output.  Mild tenderness to palpation Central nervous system: Awake and alert Extremities: Improving upper extremity edema Skin: Ecchymosis    Data Reviewed: I have personally reviewed following labs and imaging studies  CBC: Recent Labs  Lab 04/28/19 1007  04/29/19 0707 04/30/19 1501 05/01/19 0725 05/02/19 0805 05/03/19 0348 05/04/19 0210  WBC 26.0*  --  23.0* 38.5* 45.8* 49.5* 40.4* 35.0*  NEUTROABS 24.4*  --  18.8*  --   --   --   --   --   HGB 9.9*   < > 9.4* 9.1* 9.5* 9.1* 9.8* 8.8*  HCT 28.7*   < > 26.8* 24.9* 27.1* 26.3* 27.8* 25.8*  MCV 82.7  --  86.5 86.5 87.7 89.5 87.7 89.0  PLT 137*  --  95* 155 174 277 342 435*   < > = values in this interval not displayed.    Basic Metabolic Panel: Recent Labs  Lab 04/30/19 1501 05/01/19 0725 05/02/19 0805 05/03/19 0348 05/04/19 0210  NA 148* 149* 139 135 134*  K 3.8 3.8 3.2* 4.3 3.8  CL 120* 118* 108 103 105  CO2 20* 20* 21* 21* 21*  GLUCOSE 127* 95 173* 82 85  BUN 65* 58* 33* 25* 19  CREATININE 1.77* 1.55* 0.95 0.75 0.62  CALCIUM 7.5* 7.7* 7.3* 7.6* 7.2*  MG  --   --  1.8  --   --   PHOS 3.1 2.8 2.0* 2.4* 3.1   GFR: Estimated Creatinine Clearance: 51.7 mL/min (by C-G formula based on SCr of 0.62 mg/dL). Liver Function Tests: Recent Labs  Lab 04/30/19 1501 05/01/19 0725 05/02/19 0805 05/03/19 0348 05/04/19 0210  ALBUMIN 1.7* 1.8* 1.6* 1.6* 1.6*   No results for input(s): LIPASE, AMYLASE in the last 168 hours. No results for input(s): AMMONIA in the last 168 hours. Coagulation Profile: Recent Labs  Lab 04/28/19 1734  INR 1.5*   Cardiac Enzymes: No results for input(s): CKTOTAL, CKMB, CKMBINDEX, TROPONINI in the last 168 hours. BNP (last 3 results) No results for input(s): PROBNP in the last 8760 hours. HbA1C: No results for input(s): HGBA1C in the last 72 hours. CBG: Recent Labs  Lab 05/03/19 1128 05/03/19 1610 05/03/19 2014 05/04/19 0008 05/04/19 0414  GLUCAP 99 148* 105* 83 96   Lipid Profile: No results for input(s): CHOL, HDL, LDLCALC, TRIG, CHOLHDL, LDLDIRECT in the last 72 hours. Thyroid Function Tests: No results for input(s): TSH, T4TOTAL, FREET4, T3FREE, THYROIDAB in the last 72 hours. Anemia Panel: No results for input(s): VITAMINB12, FOLATE, FERRITIN, TIBC, IRON, RETICCTPCT in the last 72 hours. Sepsis Labs: Recent Labs  Lab 04/28/19 1007  LATICACIDVEN 1.8    Recent Results (from the past 240 hour(s))  SARS CORONAVIRUS 2 (TAT 6-24 HRS) Nasopharyngeal Nasopharyngeal Swab     Status: None   Collection Time: 04/25/19 10:57 AM   Specimen: Nasopharyngeal Swab  Result Value Ref Range Status   SARS Coronavirus 2 NEGATIVE NEGATIVE Final    Comment: (NOTE)  SARS-CoV-2 target nucleic acids are NOT DETECTED. The SARS-CoV-2 RNA is generally detectable in upper and lower respiratory specimens during the acute phase of infection. Negative results do not preclude SARS-CoV-2 infection, do not rule out co-infections with other pathogens, and should not be used as the sole basis for treatment or other patient management decisions. Negative results must be combined with clinical observations, patient history, and epidemiological information. The  expected result is Negative. Fact Sheet for Patients: SugarRoll.be Fact Sheet for Healthcare Providers: https://www.woods-mathews.com/ This test is not yet approved or cleared by the [place] FDA and  has been authorized for detection and/or diagnosis of SARS-CoV-2 by FDA under an Emergency Use Authorization (EUA). This EUA will remain  in effect (meaning this test can be used) for the duration of the COVID-19 declaration under Section 56 4(b)(1) of the Act, 21 U.S.C. section 360bbb-3(b)(1), unless the authorization is terminated or revoked sooner. Performed at [facility], [place] [address] [place], [place] [address]   MRSA PCR Screening     Status: None   Collection Time: 04/25/19  2:48 PM   Specimen: Nasal Mucosa; Nasopharyngeal  Result Value Ref Range Status   MRSA by PCR NEGATIVE NEGATIVE Final    Comment:        The GeneXpert MRSA Assay (FDA approved for NASAL specimens only), is one component of a comprehensive MRSA colonization surveillance program. It is not intended to diagnose MRSA infection nor to guide or monitor treatment for MRSA infections. Performed at [facility], [place] [address] [place], [place] [address]   Culture, blood (x 2)     Status: None   Collection Time: 04/25/19  5:05 PM   Specimen: BLOOD LEFT HAND  Result Value Ref Range Status   Specimen Description BLOOD LEFT HAND  Final   Special Requests   Final     AEROBIC BOTTLE ONLY Blood Culture results may not be optimal due to an inadequate volume of blood received in culture bottles   Culture   Final    NO GROWTH 5 DAYS Performed at [facility], [place] [address] [place], [place] [address]    Report Status 04/30/2019 FINAL  Final  Culture, blood (x 2)     Status: None   Collection Time: 04/25/19  5:12 PM   Specimen: BLOOD RIGHT HAND  Result Value Ref Range Status   Specimen Description BLOOD RIGHT HAND  Final   Special Requests AEROBIC BOTTLE ONLY Blood Culture adequate volume  Final   Culture   Final    NO GROWTH 5 DAYS Performed at [facility], [place] [address], [place], [place] [address]    Report Status 04/30/2019 FINAL  Final         Radiology Studies: No results found.      Scheduled Meds: . sodium chloride   Intravenous Once  . acetaminophen  1,000 mg Oral Q8H  . Chlorhexidine Gluconate Cloth  6 each Topical Daily  . feeding supplement (ENSURE ENLIVE)  237 mL Oral TID BM  . feeding supplement (VITAL HIGH PROTEIN)  1,000 mL Per Tube Q24H  . methocarbamol  500 mg Oral TID  . multivitamin with minerals  1 tablet Oral Daily  . saccharomyces boulardii  250 mg Oral BID   Continuous Infusions: . piperacillin-tazobactam (ZOSYN)  IV 3.375 g (05/04/19 1402)     LOS: 9 days    Time spent: 25 mins. More than 50% of that time was spent in counseling and/or coordination of care.      Harold Hedge, DO [facility] Pager [phone]  If 7PM-7AM, please contact night-coverage [email or web address] Password [facility] 05/04/2019, 7:43 PM

## 2019-05-04 NOTE — TOC Initial Note (Addendum)
Transition of Care [facility]) - Initial/Assessment Note    Patient Details  Name: Joanne Burke MRN: [id number] Date of Birth: 08-24-1930  [facility] CM/SW Contact:    Sharin Mons, RN Phone Number: 05/04/2019, 5:21 PM  Clinical Narrative:    Admitted with Diverticulitis of colon with perforation, s/p laparotomy with sigmoid colectomy, colostomy and Stamm gastrostomy tube placement, 9/30.     NCM spoke with pt @ bedside regarding TOC needs.  Pt became teary and gave NCM the ok to speak with spouse concerning needs. NCM spoke with husband Joanne Burke via phone and shared PT's recommendation : SNF.   Husband expressed understanding of PT recommendation and is agreeable to SNF placement at time of discharge. Reports preference for  [facility].NCM discussed insurance authorization process and will provided Medicare SNF ratings list  to pt @ bedside. No further questions reported at this time from husband. NCM to continue to follow and assist with discharge planning needs.   Joanne Burke (Spouse)      [phone]         Expected Discharge Plan: Skilled Nursing Facility Barriers to Discharge: Continued Medical Work up   Patient Goals and CMS Choice        Expected Discharge Plan and Services Expected Discharge Plan: [place]   Discharge Planning Services: CM Consult   Living arrangements for the past 2 months: Single Family Home                 DME Arranged: N/A DME Agency: NA       HH Arranged: NA HH Agency: NA        Prior Living Arrangements/Services Living arrangements for the past 2 months: Single Family Home Lives with:: Spouse Patient language and need for interpreter reviewed:: Yes Do you feel safe going back to the place where you live?: Yes      Need for Family Participation in Patient Care: Yes (Comment) Care giver support system in place?: Yes (comment)   Criminal Activity/Legal Involvement Pertinent to  Current Situation/Hospitalization: No - Comment as needed  Activities of Daily Living      Permission Sought/Granted Permission sought to share information with : Case Manager Permission granted to share information with : Yes, Verbal Permission Granted  Share Information with NAME: Joanne Burke ([phone]           Emotional Assessment Appearance:: Appears stated age Attitude/Demeanor/Rapport: Engaged Affect (typically observed): Accepting Orientation: : Oriented to Self, Oriented to Place, Oriented to  Time, Oriented to Situation Alcohol / Substance Use: Not Applicable Psych Involvement: No (comment)  Admission diagnosis:  Pelvic fluid collection [R18.8] Patient Active Problem List   Diagnosis Date Noted  . Diverticulitis of colon with perforation 04/25/2019  . Sepsis ([place]) 04/25/2019   PCP:  Patient, No Pcp Per Pharmacy:   [id number] - [place], [place] - [address] [place] [address] Phone: [phone] Fax: [phone]     Social Determinants of Health (SDOH) Interventions    Readmission Risk Interventions No flowsheet data found.

## 2019-05-05 DIAGNOSIS — D72829 Elevated white blood cell count, unspecified: Secondary | ICD-10-CM

## 2019-05-05 DIAGNOSIS — Z515 Encounter for palliative care: Secondary | ICD-10-CM

## 2019-05-05 DIAGNOSIS — Z7189 Other specified counseling: Secondary | ICD-10-CM

## 2019-05-05 LAB — BASIC METABOLIC PANEL
Anion gap: 12 (ref 5–15)
BUN: 15 mg/dL (ref 8–23)
CO2: 23 mmol/L (ref 22–32)
Calcium: 7.4 mg/dL — ABNORMAL LOW (ref 8.9–10.3)
Chloride: 100 mmol/L (ref 98–111)
Creatinine, Ser: 0.62 mg/dL (ref 0.44–1.00)
GFR calc Af Amer: >60 mL/min (ref >60)
GFR calc non Af Amer: >60 mL/min (ref >60)
Glucose, Bld: 107 mg/dL — ABNORMAL HIGH (ref 70–99)
Potassium: 3 mmol/L — ABNORMAL LOW (ref 3.5–5.1)
Sodium: 135 mmol/L (ref 135–145)

## 2019-05-05 LAB — GLUCOSE, CAPILLARY
Glucose-Capillary: 105 mg/dL — ABNORMAL HIGH (ref 70–99)
Glucose-Capillary: 110 mg/dL — ABNORMAL HIGH (ref 70–99)
Glucose-Capillary: 112 mg/dL — ABNORMAL HIGH (ref 70–99)
Glucose-Capillary: 114 mg/dL — ABNORMAL HIGH (ref 70–99)
Glucose-Capillary: 140 mg/dL — ABNORMAL HIGH (ref 70–99)

## 2019-05-05 LAB — CBC
HCT: 25 % — ABNORMAL LOW (ref 36.0–46.0)
Hemoglobin: 8.5 g/dL — ABNORMAL LOW (ref 12.0–15.0)
MCH: 30.7 pg (ref 26.0–34.0)
MCHC: 34 g/dL (ref 30.0–36.0)
MCV: 90.3 fL (ref 80.0–100.0)
Platelets: 538 10*3/uL — ABNORMAL HIGH (ref 150–400)
RBC: 2.77 MIL/uL — ABNORMAL LOW (ref 3.87–5.11)
RDW: 18.5 % — ABNORMAL HIGH (ref 11.5–15.5)
WBC: 36.8 10*3/uL — ABNORMAL HIGH (ref 4.0–10.5)
nRBC: 0 % (ref 0.0–0.2)

## 2019-05-05 LAB — MAGNESIUM: Magnesium: 1.5 mg/dL — ABNORMAL LOW (ref 1.7–2.4)

## 2019-05-05 LAB — PHOSPHORUS: Phosphorus: 2.7 mg/dL (ref 2.5–4.6)

## 2019-05-05 NOTE — Progress Notes (Addendum)
0820 - Pt received resting in bed, alert and oriented x 4. Pt reports no nausea or pain at this time, though she does mention that she is sore (no rating given). Pt asking that she not receive po Tylenol anymore as she says it is making her nauseated. Per report, received one time prn dose of morphine with good effect - will monitor.  Abdominal binder in place - dressing c/d/i. Ostomy with moist pink/red os and small amount of dark brown stool. G tube with TF running per order.  1015 - Pt denies pain or nausea at this time. Declined Ensure, said she would drink some later. Encouraged intake, pt not receptive. Pt asking about advancing diet as she states MD said "I could have anything I want". Dressing around JP drain changed - saturated with serosanguinous drainage.

## 2019-05-05 NOTE — Progress Notes (Signed)
Per patient request removed SCDs at 2250, patient stated SCDs keeps waking her up. Patient already removed one SCD prior to RN assistance.  Dressing change completed at 0530. Moderate serosangineous drainage on abdominal open wound dressing. Moderate serosangineous drainage at JP drain site. New dressings applied.    20 ml serosanguineous drainage from JP drain. Small red clot-like material observed in drain; not able to remove from drain.   Patient somewhat restless with poor sleep until after approximately 0400.   Patient BP elevated with restlessness and when transferring to/from [facility].  Educated patient on importance of maintaining head of bed at [facility].

## 2019-05-05 NOTE — Progress Notes (Addendum)
1515 - Pt moved to bed 5 west 36. Husband at bedside. Husband brought pt's cane. Pt requesting [place] for night time use, educated about surgery's instructions to be up for all voids. Pt needs reinforcement. Pt did get up to the bedside commode - assist of two to pivot. Pt answers orientation questions correctly but is forgetful and anxious. Will continue to monitor.   1630 - Pt up to bedside commode with assistance. Dressing changed. Pt has most tenderness and drainage around JP drain site, drainage amount is moderate and serous. Pt encouraged to drink Ensure. Will monitor.

## 2019-05-05 NOTE — Progress Notes (Signed)
Pt's dressings changed as pt said she thought her ostomy was "leaking". Ostomy pouch intact, emptied. Attempted to teach pt about procedure, pt not ready for education. Midline wound is pink with subcutaneous fat visible and a stitch visible. Small amount of serous and serosanguinous drainage noted on old dressing. I replaced with sterile saline wet to dry. G tube dressing clean and dry, replaced with new split gauze. JP drain had moderate amount of serous drainage, replaced with clean split gauze. ABD pad covered all, replaced abdominal binder above level of the ostomy.  Pt asking about diet order change. Will page MD.

## 2019-05-05 NOTE — Progress Notes (Signed)
Patient ID: Joanne Burke, female   DOB: Oct 17, 1930, 85 y.o.   MRN: [id number]  PROGRESS NOTE    Joanne Burke  IRJ:[id number] DOB: [birth year] DOA: 04/25/2019 PCP: Patient, No Pcp Per   Brief Narrative:  This is-year-old female with no significant past medical history presented with abdominal pain.  She was found to have CT evidence of possible diverticulitis, abscess versus fluid in the abdomen along with acute kidney injury, lactic acidosis and leukocytosis.  Surgery was consulted.  Nephrology was also consulted for acute kidney injury.  She underwent exploratory laparotomy with sigmoid colectomy, colostomy and gastrostomy tube placement with mobilization of splenic flexure.  She has been on intravenous antibiotics.  Assessment & Plan:   Sigmoid diverticulitis with multiple pelvic abscesses -Status post exploratory laparotomy with sigmoid colectomy, colostomy and gastrostomy tube placement on 04/28/2019 -General surgery following.  Wound care as per general surgery recommendations.  Continue G-tube feeding. -Currently on IV Zosyn as per general surgery.  Leukocytosis -White cell count still extremely elevated.  Monitor.  If continues to worsen, might have to do CT of the abdomen.  AKI Non-anion gap metabolic acidosis -Resolved.  Nephrology signed off.  Successfully passed voiding trial  Hypokalemia -Replace.  Repeat a.m. labs  Hypomagnesemia -Replace.  Repeat a.m. labs  Acute respiratory failure with hypoxia -Resolved. -Chest x-ray showed patchy bilateral airspace opacities concerning for pneumonia on 04/27/2019, could be aspiration pneumonia or pulmonary edema.  She is currently on Zosyn. -Currently on room air.  Generalized conditioning -PT eval.  Might need placement.  Will request palliative Care consult for goals of care discussion.  Acute metabolic encephalopathy, likely multifactorial -Most likely secondary to narcotic medication induced in the setting of recent  abdominal surgery along with pain/hyponatremia/AKI.  Had received multiple doses of Ativan for agitation -Mental status is much improved.  Monitor.  Avoid sedative medications as much as possible.   DVT prophylaxis: SCDs Code Status: Full Family Communication: None at bedside Disposition Plan: Might need SNF placement.  Consultants: General surgery/nephrology  Procedures:  exploratory laparotomy with sigmoid colectomy, colostomy and gastrostomy tube placement on 04/28/2019  Antimicrobials:  Anti-infectives (From admission, onward)   Start     Dose/Rate Route Frequency Ordered Stop   05/04/19 1400  piperacillin-tazobactam (ZOSYN) IVPB 3.375 g     3.375 g 12.5 mL/hr over 240 Minutes Intravenous Every 8 hours 05/04/19 1304     04/29/19 1400  piperacillin-tazobactam (ZOSYN) IVPB 2.25 g  Status:  Discontinued     2.25 g 100 mL/hr over 30 Minutes Intravenous Every 8 hours 04/29/19 1101 05/04/19 1304   04/27/19 1000  cefTRIAXone (ROCEPHIN) 2 g in sodium chloride 0.9 % 100 mL IVPB  Status:  Discontinued     2 g 200 mL/hr over 30 Minutes Intravenous Every 24 hours 04/27/19 0859 04/29/19 1101   04/27/19 1000  metroNIDAZOLE (FLAGYL) IVPB 500 mg  Status:  Discontinued     500 mg 100 mL/hr over 60 Minutes Intravenous Every 8 hours 04/27/19 0859 04/29/19 1101   04/26/19 2200  piperacillin-tazobactam (ZOSYN) IVPB 3.375 g  Status:  Discontinued     3.375 g 12.5 mL/hr over 240 Minutes Intravenous Every 12 hours 04/26/19 1253 04/27/19 0858   04/26/19 1800  aztreonam (AZACTAM) 1 g in sodium chloride 0.9 % 100 mL IVPB  Status:  Discontinued     1 g 200 mL/hr over 30 Minutes Intravenous Every 12 hours 04/26/19 0805 04/26/19 1253   04/25/19 2030  ciprofloxacin (CIPRO) IVPB 200 mg  Status:  Discontinued     200 mg 100 mL/hr over 60 Minutes Intravenous Every 12 hours 04/25/19 1109 04/25/19 1640   04/25/19 2030  aztreonam (AZACTAM) 1 g in sodium chloride 0.9 % 100 mL IVPB  Status:  Discontinued     1 g  200 mL/hr over 30 Minutes Intravenous Every 8 hours 04/25/19 1651 04/26/19 0805   04/25/19 1730  aztreonam (AZACTAM) 2 g in sodium chloride 0.9 % 100 mL IVPB  Status:  Discontinued     2 g 200 mL/hr over 30 Minutes Intravenous  Once 04/25/19 1640 04/25/19 1651   04/25/19 1600  metroNIDAZOLE (FLAGYL) IVPB 500 mg  Status:  Discontinued     500 mg 100 mL/hr over 60 Minutes Intravenous Every 8 hours 04/25/19 1109 04/26/19 1253   04/25/19 0730  ciprofloxacin (CIPRO) IVPB 400 mg     400 mg 200 mL/hr over 60 Minutes Intravenous  Once 04/25/19 0729 04/25/19 0921   04/25/19 0730  metroNIDAZOLE (FLAGYL) IVPB 500 mg     500 mg 100 mL/hr over 60 Minutes Intravenous  Once 04/25/19 0729 04/25/19 0109       Subjective: Patient seen and examined at bedside.  She is awake, poor historian.  Denies worsening abdominal pain.  No overnight fever or vomiting.  Objective: Vitals:   05/05/19 0406 05/05/19 0504 05/05/19 0741 05/05/19 0758  BP:    123/63  Pulse: 80   78  Resp:    18  Temp: 97.9 F (36.6 C)  97.6 F (36.4 C)   TempSrc: Oral  Oral   SpO2: 95%   97%  Weight:  88 kg    Height:        Intake/Output Summary (Last 24 hours) at 05/05/2019 1159 Last data filed at 05/05/2019 0945 Gross per 24 hour  Intake 1402.79 ml  Output 1511 ml  Net -108.21 ml   Filed Weights   05/02/19 0325 05/04/19 0544 05/05/19 0504  Weight: 88 kg 87 kg 88 kg    Examination:  General exam: Appears calm and comfortable.  Elderly female lying in bed.  Awake, poor historian. Respiratory system: Bilateral decreased breath sounds at bases with some scattered crackles Cardiovascular system: S1 & S2 heard, Rate controlled Gastrointestinal system: Abdomen is nondistended, soft and has dressing present. Normal bowel sounds heard.  Colostomy present Extremities: No cyanosis, clubbing, edema    Data Reviewed: I have personally reviewed following labs and imaging studies  CBC: Recent Labs  Lab 04/29/19 0707   05/01/19 0725 05/02/19 0805 05/03/19 0348 05/04/19 0210 05/05/19 0233  WBC 23.0*   < > 45.8* 49.5* 40.4* 35.0* 36.8*  NEUTROABS 18.8*  --   --   --   --   --   --   HGB 9.4*   < > 9.5* 9.1* 9.8* 8.8* 8.5*  HCT 26.8*   < > 27.1* 26.3* 27.8* 25.8* 25.0*  MCV 86.5   < > 87.7 89.5 87.7 89.0 90.3  PLT 95*   < > 174 277 342 435* 538*   < > = values in this interval not displayed.   Basic Metabolic Panel: Recent Labs  Lab 05/01/19 0725 05/02/19 0805 05/03/19 0348 05/04/19 0210 05/05/19 0233  NA 149* 139 135 134* 135  K 3.8 3.2* 4.3 3.8 3.0*  CL 118* 108 103 105 100  CO2 20* 21* 21* 21* 23  GLUCOSE 95 173* 82 85 107*  BUN 58* 33* 25* 19 15  CREATININE 1.55* 0.95 0.75 0.62 0.62  CALCIUM 7.7* 7.3* 7.6* 7.2* 7.4*  MG  --  1.8  --   --  1.5*  PHOS 2.8 2.0* 2.4* 3.1 2.7   GFR: Estimated Creatinine Clearance: 52 mL/min (by C-G formula based on SCr of 0.62 mg/dL). Liver Function Tests: Recent Labs  Lab 04/30/19 1501 05/01/19 0725 05/02/19 0805 05/03/19 0348 05/04/19 0210  ALBUMIN 1.7* 1.8* 1.6* 1.6* 1.6*   No results for input(s): LIPASE, AMYLASE in the last 168 hours. No results for input(s): AMMONIA in the last 168 hours. Coagulation Profile: Recent Labs  Lab 04/28/19 1734  INR 1.5*   Cardiac Enzymes: No results for input(s): CKTOTAL, CKMB, CKMBINDEX, TROPONINI in the last 168 hours. BNP (last 3 results) No results for input(s): PROBNP in the last 8760 hours. HbA1C: No results for input(s): HGBA1C in the last 72 hours. CBG: Recent Labs  Lab 05/04/19 0414 05/04/19 2039 05/05/19 0004 05/05/19 0411 05/05/19 0741  GLUCAP 96 122* 140* 110* 105*   Lipid Profile: No results for input(s): CHOL, HDL, LDLCALC, TRIG, CHOLHDL, LDLDIRECT in the last 72 hours. Thyroid Function Tests: No results for input(s): TSH, T4TOTAL, FREET4, T3FREE, THYROIDAB in the last 72 hours. Anemia Panel: No results for input(s): VITAMINB12, FOLATE, FERRITIN, TIBC, IRON, RETICCTPCT in the  last 72 hours. Sepsis Labs: No results for input(s): PROCALCITON, LATICACIDVEN in the last 168 hours.  Recent Results (from the past 240 hour(s))  MRSA PCR Screening     Status: None   Collection Time: 04/25/19  2:48 PM   Specimen: Nasal Mucosa; Nasopharyngeal  Result Value Ref Range Status   MRSA by PCR NEGATIVE NEGATIVE Final    Comment:        The GeneXpert MRSA Assay (FDA approved for NASAL specimens only), is one component of a comprehensive MRSA colonization surveillance program. It is not intended to diagnose MRSA infection nor to guide or monitor treatment for MRSA infections. Performed at [facility], [address], [place], [place] [address]   Culture, blood (x 2)     Status: None   Collection Time: 04/25/19  5:05 PM   Specimen: BLOOD LEFT HAND  Result Value Ref Range Status   Specimen Description BLOOD LEFT HAND  Final   Special Requests   Final    AEROBIC BOTTLE ONLY Blood Culture results may not be optimal due to an inadequate volume of blood received in culture bottles   Culture   Final    NO GROWTH 5 DAYS Performed at [facility], [address], [place], [place] [address]    Report Status 04/30/2019 FINAL  Final  Culture, blood (x 2)     Status: None   Collection Time: 04/25/19  5:12 PM   Specimen: BLOOD RIGHT HAND  Result Value Ref Range Status   Specimen Description BLOOD RIGHT HAND  Final   Special Requests AEROBIC BOTTLE ONLY Blood Culture adequate volume  Final   Culture   Final    NO GROWTH 5 DAYS Performed at [facility], [address] [place], [place] [address]    Report Status 04/30/2019 FINAL  Final         Radiology Studies: No results found.      Scheduled Meds: . sodium chloride   Intravenous Once  . acetaminophen  1,000 mg Oral Q8H  . Chlorhexidine Gluconate Cloth  6 each Topical Daily  . feeding supplement (ENSURE ENLIVE)  237 mL Oral TID BM  . feeding supplement (VITAL HIGH PROTEIN)  1,000  mL Per  Tube Q24H  . methocarbamol  500 mg Oral TID  . multivitamin with minerals  1 tablet Oral Daily  . saccharomyces boulardii  250 mg Oral BID   Continuous Infusions: . piperacillin-tazobactam (ZOSYN)  IV 3.375 g (05/05/19 0501)          Glade LloydKshitiz Ardene Remley, MD [facility] 05/05/2019, 11:59 AM

## 2019-05-05 NOTE — Consult Note (Signed)
WOC in to see patient, pouch intact from visit on Monday.  Patient does not appear to be cognitively ready to learn pouch change. Will change again on Friday and assess at that time.  [place] Nurse will follow along with you for continued support with ostomy teaching and care [place] MSN, RN, [place], [place], [place]

## 2019-05-05 NOTE — Progress Notes (Signed)
Physical Therapy Treatment Patient Details Name: Joanne Burke MRN: [id number] DOB: January 02, 1931 Today's Date: 05/05/2019    History of Present Illness 85 y.o. female with no significant medical history presenting with abdominal pain. Pt admitted for diverticulitis of colon with perforation, sepsis. On 04/28/19 she underwent sigmoid colectomy, colostomy and St[place] gastrostomy tube placement.    PT Comments    Pt progressing slowly and steadily towards physical therapy goals. Seemingly improved pain control. Requiring moderate assist and a walker for limited room ambulation. Continues with decreased cognition, weakness, balance impairments, decreased endurance. Continue to recommend SNF for ongoing Physical Therapy.      Follow Up Recommendations  SNF;Supervision/Assistance - 24 hour     Equipment Recommendations  Rolling walker with 5" wheels    Recommendations for Other Services       Precautions / Restrictions Precautions Precautions: Fall Precaution Comments: bulb drain, colostomy, G tube Restrictions Weight Bearing Restrictions: No    Mobility  Bed Mobility Overal bed mobility: Needs Assistance Bed Mobility: Supine to Sit     Supine to sit: Min assist     General bed mobility comments: Increased time and effort, use of rail, cues for sequencing. MinA for trunk elevation to upright  Transfers Overall transfer level: Needs assistance Equipment used: Rolling walker (2 wheeled) Transfers: Sit to/from [facility] Sit to Stand: Min assist         General transfer comment: Pt preferring to pull up on walker, minA to stabilize   Ambulation/Gait Ambulation/Gait assistance: Mod assist;+2 physical assistance Gait Distance (Feet): 3 Feet Assistive device: Rolling walker (2 wheeled) Gait Pattern/deviations: Decreased stride length;Step-to pattern;Trunk flexed Gait velocity: decreased   General Gait Details: Pt taking pivotal steps from bed to chair,  loss of balance with turn, requiring moderate assist to correct and provide control to sit down onto reclienr   Stairs             Wheelchair Mobility    Modified Rankin (Stroke Patients Only)       Balance Overall balance assessment: Needs assistance Sitting-balance support: Bilateral upper extremity supported;Feet unsupported Sitting balance-Leahy Scale: Fair Sitting balance - Comments: posterior bias initially   Standing balance support: Bilateral upper extremity supported;During functional activity Standing balance-Leahy Scale: Poor                              Cognition Arousal/Alertness: Awake/alert Behavior During Therapy: WFL for tasks assessed/performed Overall Cognitive Status: Impaired/Different from baseline Area of Impairment: Memory;Following commands;Problem solving                     Memory: Decreased short-term memory Following Commands: Follows one step commands with increased time     Problem Solving: Slow processing;Difficulty sequencing General Comments: A&Ox4, multimodal cues for problem solving      Exercises      General Comments        Pertinent Vitals/Pain Pain Assessment: Faces Faces Pain Scale: Hurts little more Pain Location: abdomen Pain Descriptors / Indicators: Grimacing;Guarding;Discomfort Pain Intervention(s): Monitored during session;Repositioned    Home Living                      Prior Function            PT Goals (current goals can now be found in the care plan section) Acute Rehab PT Goals Patient Stated Goal: decrease pain Potential to Achieve Goals: Good Progress towards  PT goals: Progressing toward goals    Frequency    Min 3X/week      PT Plan Current plan remains appropriate    Co-evaluation              AM-PAC PT "6 Clicks" Mobility   Outcome Measure  Help needed turning from your back to your side while in a flat bed without using bedrails?: A  Little Help needed moving from lying on your back to sitting on the side of a flat bed without using bedrails?: A Little Help needed moving to and from a bed to a chair (including a wheelchair)?: A Lot Help needed standing up from a chair using your arms (e.g., wheelchair or bedside chair)?: A Little Help needed to walk in hospital room?: A Lot Help needed climbing 3-5 steps with a railing? : Total 6 Click Score: 14    End of Session   Activity Tolerance: Patient tolerated treatment well Patient left: in chair;with call bell/phone within reach;with chair alarm set Nurse Communication: Mobility status PT Visit Diagnosis: Other abnormalities of gait and mobility (R26.89);Pain;Muscle weakness (generalized) (M62.81)     Time: 9833-8250 PT Time Calculation (min) (ACUTE ONLY): 21 min  Charges:  $Therapeutic Activity: 8-22 mins                     Laurina Bustle, PT, DPT Acute Rehabilitation Services Pager [phone] Office [phone]    Vanetta Mulders 05/05/2019, 12:58 PM

## 2019-05-05 NOTE — Progress Notes (Signed)
7 Days Post-Op   Subjective/Chief Complaint: No complaints this morning except difficulty sleeping. Tolerating tube feeds   Objective: Vital signs in last 24 hours: Temp:  [97.4 F (36.3 C)-98.6 F (37 C)] 97.6 F (36.4 C) (10/07 0741) Pulse Rate:  [78-91] 78 (10/07 0758) Resp:  [18-24] 18 (10/07 0758) BP: (105-134)/(62-78) 123/63 (10/07 0758) SpO2:  [95 %-100 %] 97 % (10/07 0758) Weight:  [88 kg] 88 kg (10/07 0504) Last BM Date: 05/05/19(ostomy bag)  Intake/Output from previous day: 10/06 0701 - 10/07 0700 In: 226.1 [P.O.:120; IV Piggyback:106.1] Out: 1361 [Urine:1250; Drains:10; Stool:101] Intake/Output this shift: No intake/output data recorded.  Exam: Awake and alert Abdomen soft, wound clean, ostomy working well [employer]  Lab Results:  Recent Labs    05/04/19 0210 05/05/19 0233  WBC 35.0* 36.8*  HGB 8.8* 8.5*  HCT 25.8* 25.0*  PLT 435* 538*   BMET Recent Labs    05/04/19 0210 05/05/19 0233  NA 134* 135  K 3.8 3.0*  CL 105 100  CO2 21* 23  GLUCOSE 85 107*  BUN 19 15  CREATININE 0.62 0.62  CALCIUM 7.2* 7.4*   PT/INR No results for input(s): LABPROT, INR in the last 72 hours. ABG No results for input(s): PHART, HCO3 in the last 72 hours.  Invalid input(s): PCO2, PO2  Studies/Results: No results found.  Anti-infectives: Anti-infectives (From admission, onward)   Start     Dose/Rate Route Frequency Ordered Stop   05/04/19 1400  piperacillin-tazobactam (ZOSYN) IVPB 3.375 g     3.375 g 12.5 mL/hr over 240 Minutes Intravenous Every 8 hours 05/04/19 1304     04/29/19 1400  piperacillin-tazobactam (ZOSYN) IVPB 2.25 g  Status:  Discontinued     2.25 g 100 mL/hr over 30 Minutes Intravenous Every 8 hours 04/29/19 1101 05/04/19 1304   04/27/19 1000  cefTRIAXone (ROCEPHIN) 2 g in sodium chloride 0.9 % 100 mL IVPB  Status:  Discontinued     2 g 200 mL/hr over 30 Minutes Intravenous Every 24 hours 04/27/19 0859 04/29/19 1101   04/27/19 1000   metroNIDAZOLE (FLAGYL) IVPB 500 mg  Status:  Discontinued     500 mg 100 mL/hr over 60 Minutes Intravenous Every 8 hours 04/27/19 0859 04/29/19 1101   04/26/19 2200  piperacillin-tazobactam (ZOSYN) IVPB 3.375 g  Status:  Discontinued     3.375 g 12.5 mL/hr over 240 Minutes Intravenous Every 12 hours 04/26/19 1253 04/27/19 0858   04/26/19 1800  aztreonam (AZACTAM) 1 g in sodium chloride 0.9 % 100 mL IVPB  Status:  Discontinued     1 g 200 mL/hr over 30 Minutes Intravenous Every 12 hours 04/26/19 0805 04/26/19 1253   04/25/19 2030  ciprofloxacin (CIPRO) IVPB 200 mg  Status:  Discontinued     200 mg 100 mL/hr over 60 Minutes Intravenous Every 12 hours 04/25/19 1109 04/25/19 1640   04/25/19 2030  aztreonam (AZACTAM) 1 g in sodium chloride 0.9 % 100 mL IVPB  Status:  Discontinued     1 g 200 mL/hr over 30 Minutes Intravenous Every 8 hours 04/25/19 1651 04/26/19 0805   04/25/19 1730  aztreonam (AZACTAM) 2 g in sodium chloride 0.9 % 100 mL IVPB  Status:  Discontinued     2 g 200 mL/hr over 30 Minutes Intravenous  Once 04/25/19 1640 04/25/19 1651   04/25/19 1600  metroNIDAZOLE (FLAGYL) IVPB 500 mg  Status:  Discontinued     500 mg 100 mL/hr over 60 Minutes Intravenous Every 8 hours 04/25/19  1109 04/26/19 1253   04/25/19 0730  ciprofloxacin (CIPRO) IVPB 400 mg     400 mg 200 mL/hr over 60 Minutes Intravenous  Once 04/25/19 0729 04/25/19 0921   04/25/19 0730  metroNIDAZOLE (FLAGYL) IVPB 500 mg     500 mg 100 mL/hr over 60 Minutes Intravenous  Once 04/25/19 0729 04/25/19 0924      Assessment/Plan: s/p Procedure(s): EXPLORATORY LAPAROTOMY (N/A) SIGMOID COLECTOMY (N/A) COLOSTOMY (N/A) INSERTION OF GASTROSTOMY TUBE (N/A)  Continues slow progress but deconditioned Hopefully po intake can improve and continuous tube feeds can be stopped Continue wound care  LOS: 10 days    Coralie Keens MD 05/05/2019

## 2019-05-05 NOTE — Consult Note (Addendum)
Consultation Note Date: 05/05/2019   Patient Name: Joanne Burke  DOB: 1930/10/20  MRN: [id number]  Age / Sex: 85 y.o., female  PCP: Patient, No Pcp Per Referring Physician: Aline August, MD  Reason for Consultation: Establishing goals of care  HPI/Patient Profile: 85 y.o. female  with past medical history of good health- no problems admitted on 04/25/2019 with abdominal pain. Found to pelvic abscesses likely from diverticulitis, sepsis. Underwent surgical resection with placement of colostomy and g-tube. Palliative medicine consulted for [name].   Clinical Assessment and Goals of Care:  I have reviewed medical records including EPIC notes, labs and imaging, assessed the patient and then met at the bedside along with the patient  to discuss diagnosis prognosis, GOC, EOL wishes, disposition and options.  I introduced Palliative Medicine as specialized medical care for people living with serious illness. It focuses on providing relief from the symptoms and stress of a serious illness. The goal is to improve quality of life for both the patient and the family.  We discussed a brief life review of the patient. She previously worked "up [employer] in [place]. She lives at home with her husband and their two dogs.   As far as functional and nutritional status prior to this admission she was very active, independent, eating well.    We discussed her current illness and what it means in the larger context of her on-going co-morbidities.  Natural disease trajectory and expectations at EOL were discussed. She states she is feeling much better today. She tells me she knows she is going to get better and this is not her time to die. She does talk about EOL and the fact that she has lived a good life. She would not want to live out the end of her days in a nursing home or on a ventilatory.  I attempted to  elicit values and goals of care important to the patient.  Spending time at home, with her husband and her dogs are important to her.  Advanced directives, concepts specific to code status, artifical feeding and hydration, and rehospitalization were considered and discussed. Joanne Burke states she hasn't considered code status before. She would like to consider this and discuss with her husband. She is agreeable to artificial feeding for now as it is helping her with her recovery.    Primary Decision Maker PATIENT    SUMMARY OF RECOMMENDATIONS - Full scope -PMT will followup for code status and advanced directives    Code Status/Advance Care Planning:  Full code  Palliative Prophylaxis:   Frequent Pain Assessment  Additional Recommendations (Limitations, Scope, Preferences):  Full Scope Treatment  Prognosis:    Unable to determine  Discharge Planning: To Be Determined - patient states she does not want to go to   Primary Diagnoses: Present on Admission: . Diverticulitis of colon with perforation . Sepsis ([place])   I have reviewed the medical record, interviewed the patient and family, and examined the patient. The following aspects are pertinent.  History reviewed.  No pertinent past medical history. Social History   Socioeconomic History  . Marital status: Single    Spouse name: Not on file  . Number of children: Not on file  . Years of education: Not on file  . Highest education level: Not on file  Occupational History  . Not on file  Social Needs  . Financial resource strain: Not on file  . Food insecurity    Worry: Not on file    Inability: Not on file  . Transportation needs    Medical: Not on file    Non-medical: Not on file  Tobacco Use  . Smoking status: Current Every Day Smoker  . Smokeless tobacco: Never Used  . Tobacco comment: quit 5 days ago  Substance and Sexual Activity  . Alcohol use: Never    Frequency: Never  . Drug use: Never  . Sexual  activity: Not on file  Lifestyle  . Physical activity    Days per week: Not on file    Minutes per session: Not on file  . Stress: Not on file  Relationships  . Social Herbalist on phone: Not on file    Gets together: Not on file    Attends religious service: Not on file    Active member of club or organization: Not on file    Attends meetings of clubs or organizations: Not on file    Relationship status: Not on file  Other Topics Concern  . Not on file  Social History Narrative  . Not on file   History reviewed. No pertinent family history. Scheduled Meds: . sodium chloride   Intravenous Once  . acetaminophen  1,000 mg Oral Q8H  . Chlorhexidine Gluconate Cloth  6 each Topical Daily  . feeding supplement (ENSURE ENLIVE)  237 mL Oral TID BM  . feeding supplement (VITAL HIGH PROTEIN)  1,000 mL Per Tube Q24H  . methocarbamol  500 mg Oral TID  . multivitamin with minerals  1 tablet Oral Daily  . saccharomyces boulardii  250 mg Oral BID   Continuous Infusions: . piperacillin-tazobactam (ZOSYN)  IV 3.375 g (05/05/19 1559)   PRN Meds:.dextrose, ipratropium-albuterol, lip balm, LORazepam, morphine injection, traMADol Medications Prior to Admission:  Prior to Admission medications   Medication Sig Start Date End Date Taking? Authorizing Provider  acetaminophen (TYLENOL) 500 MG tablet Take 1,000 mg by mouth every 6 (six) hours as needed for mild pain.   Yes [provider]   Allergies  Allergen Reactions  . Codeine Nausea And Vomiting   Review of Systems  Constitutional: Positive for appetite change and fatigue.    Physical Exam Vitals signs and nursing note reviewed.  Cardiovascular:     Rate and Rhythm: Normal rate.  Pulmonary:     Effort: Pulmonary effort is normal.  Skin:    Coloration: Skin is pale.  Neurological:     Mental Status: She is alert and oriented to person, place, and time.     Vital Signs: BP 133/71   Pulse 86   Temp 98.3 F  (36.8 C) (Oral)   Resp 17   Ht _0  (1.575 m)   Wt 88 kg   SpO2 99%   BMI 35.48 kg/m  Pain Scale: 0-10 POSS *See Group Information*: 1-Acceptable,Awake and alert Pain Score: 2    SpO2: SpO2: 99 % O2 Device:SpO2: 99 % O2 Flow Rate: .O2 Flow Rate (L/min): 2 L/min  IO: Intake/output summary:   Intake/Output Summary (Last 24  hours) at 05/05/2019 1631 Last data filed at 05/05/2019 1300 Gross per 24 hour  Intake 1467.79 ml  Output 1636 ml  Net -168.21 ml    LBM: Last BM Date: 05/05/19(ostomy bag) Baseline Weight: Weight: 40.8 kg Most recent weight: Weight: 88 kg     Palliative Assessment/Data: PPS: 60%     Thank you for this consult. Palliative medicine will continue to follow and assist as needed.   Time In: 1000 Time Out: 1130 Time Total: 90 minutes Greater than 50%  of this time was spent counseling and coordinating care related to the above assessment and plan.  Signed by: Mariana Kaufman, AGNP-C Palliative Medicine    Please contact Palliative Medicine Team phone at [phone] for questions and concerns.  For individual provider: See Shea Evans

## 2019-05-06 DIAGNOSIS — Z7189 Other specified counseling: Secondary | ICD-10-CM

## 2019-05-06 DIAGNOSIS — Z515 Encounter for palliative care: Secondary | ICD-10-CM

## 2019-05-06 LAB — GLUCOSE, CAPILLARY
Glucose-Capillary: 102 mg/dL — ABNORMAL HIGH (ref 70–99)
Glucose-Capillary: 128 mg/dL — ABNORMAL HIGH (ref 70–99)
Glucose-Capillary: 152 mg/dL — ABNORMAL HIGH (ref 70–99)
Glucose-Capillary: 91 mg/dL (ref 70–99)
Glucose-Capillary: 95 mg/dL (ref 70–99)
Glucose-Capillary: 99 mg/dL (ref 70–99)

## 2019-05-06 LAB — BASIC METABOLIC PANEL
Anion gap: 10 (ref 5–15)
BUN: 11 mg/dL (ref 8–23)
CO2: 26 mmol/L (ref 22–32)
Calcium: 7.7 mg/dL — ABNORMAL LOW (ref 8.9–10.3)
Chloride: 102 mmol/L (ref 98–111)
Creatinine, Ser: 0.56 mg/dL (ref 0.44–1.00)
GFR calc Af Amer: >60 mL/min (ref >60)
GFR calc non Af Amer: >60 mL/min (ref >60)
Glucose, Bld: 89 mg/dL (ref 70–99)
Potassium: 3.2 mmol/L — ABNORMAL LOW (ref 3.5–5.1)
Sodium: 138 mmol/L (ref 135–145)

## 2019-05-06 LAB — CBC WITH DIFFERENTIAL/PLATELET
Abs Immature Granulocytes: 0.86 10*3/uL — ABNORMAL HIGH (ref 0.00–0.07)
Basophils Absolute: 0.1 10*3/uL (ref 0.0–0.1)
Basophils Relative: 0 %
Eosinophils Absolute: 0.2 10*3/uL (ref 0.0–0.5)
Eosinophils Relative: 1 %
HCT: 22.6 % — ABNORMAL LOW (ref 36.0–46.0)
Hemoglobin: 7.9 g/dL — ABNORMAL LOW (ref 12.0–15.0)
Immature Granulocytes: 3 %
Lymphocytes Relative: 3 %
Lymphs Abs: 1 10*3/uL (ref 0.7–4.0)
MCH: 31.3 pg (ref 26.0–34.0)
MCHC: 35 g/dL (ref 30.0–36.0)
MCV: 89.7 fL (ref 80.0–100.0)
Monocytes Absolute: 1.2 10*3/uL — ABNORMAL HIGH (ref 0.1–1.0)
Monocytes Relative: 4 %
Neutro Abs: 25.6 10*3/uL — ABNORMAL HIGH (ref 1.7–7.7)
Neutrophils Relative %: 89 %
Platelets: 584 10*3/uL — ABNORMAL HIGH (ref 150–400)
RBC: 2.52 MIL/uL — ABNORMAL LOW (ref 3.87–5.11)
RDW: 18.9 % — ABNORMAL HIGH (ref 11.5–15.5)
WBC: 28.8 10*3/uL — ABNORMAL HIGH (ref 4.0–10.5)
nRBC: 0 % (ref 0.0–0.2)

## 2019-05-06 LAB — PHOSPHORUS: Phosphorus: 2.5 mg/dL (ref 2.5–4.6)

## 2019-05-06 LAB — MAGNESIUM: Magnesium: 1.7 mg/dL (ref 1.7–2.4)

## 2019-05-06 MED ORDER — MELATONIN 3 MG PO TABS
3.0000 mg | ORAL_TABLET | Freq: Once | ORAL | Status: AC
  Administered 2019-05-07: 3 mg via ORAL
  Filled 2019-05-06: qty 1

## 2019-05-06 MED ORDER — SODIUM CHLORIDE 0.9 % IV SOLN
INTRAVENOUS | Status: DC | PRN
  Administered 2019-05-06: 22:00:00 250 mL via INTRAVENOUS
  Administered 2019-05-07: 500 mL via INTRAVENOUS

## 2019-05-06 MED ORDER — POTASSIUM CHLORIDE CRYS ER 20 MEQ PO TBCR
40.0000 meq | EXTENDED_RELEASE_TABLET | ORAL | Status: AC
  Administered 2019-05-06 (×2): 40 meq via ORAL
  Filled 2019-05-06 (×2): qty 2

## 2019-05-06 MED ORDER — ENSURE ENLIVE PO LIQD
237.0000 mL | Freq: Three times a day (TID) | ORAL | Status: DC
  Administered 2019-05-07 – 2019-05-16 (×14): 237 mL via ORAL

## 2019-05-06 NOTE — Progress Notes (Signed)
Patient ID: Joanne Burke, female   DOB: 02-21-1931, 85 y.o.   MRN: [id number]  PROGRESS NOTE    ROSEMOND LYTTLE  RXV:[id number] DOB: 06-27-1931 DOA: 04/25/2019 PCP: Patient, No Pcp Per   Brief Narrative:  85 year old female with no significant past medical history presented with abdominal pain.  She was found to have CT evidence of possible diverticulitis, abscess versus fluid in the abdomen along with acute kidney injury, lactic acidosis and leukocytosis.  Surgery was consulted.  Nephrology was also consulted for acute kidney injury.  She underwent exploratory laparotomy with sigmoid colectomy, colostomy and gastrostomy tube placement with mobilization of splenic flexure.  She has been on intravenous antibiotics.  Assessment & Plan:   Sigmoid diverticulitis with multiple pelvic abscesses -Status post exploratory laparotomy with sigmoid colectomy, colostomy and gastrostomy tube placement on 04/28/2019 -General surgery following.  Wound care as per general surgery recommendations.  Continue G-tube feeding. -Currently on IV Zosyn as per general surgery.  Leukocytosis -White cell count elevated but improving.  Monitor.  If continues to worsen, might have to do CT of the abdomen.  AKI Non-anion gap metabolic acidosis -Resolved.  Nephrology signed off.  Successfully passed voiding trial  Hypokalemia -Replace.  Repeat a.m. labs  Hypomagnesemia -Improved.  Acute respiratory failure with hypoxia -Resolved. -Chest x-ray showed patchy bilateral airspace opacities concerning for pneumonia on 04/27/2019, could be aspiration pneumonia or pulmonary edema.  She is currently on Zosyn. -Currently on room air.  Generalized conditioning -PT recommends SNF placement. Palliative Care consulted for goals of care discussion.  Acute metabolic encephalopathy, likely multifactorial -Most likely secondary to narcotic medication induced in the setting of recent abdominal surgery along with  pain/hyponatremia/AKI.  Had received multiple doses of Ativan for agitation -Mental status is much improved.  Monitor.  Avoid sedative medications as much as possible.   DVT prophylaxis: SCDs Code Status: Full Family Communication: None at bedside Disposition Plan: SNF once cleared by general surgery. Consultants: General surgery/nephrology/palliative care  Procedures:  exploratory laparotomy with sigmoid colectomy, colostomy and gastrostomy tube placement on 04/28/2019  Antimicrobials:  Anti-infectives (From admission, onward)   Start     Dose/Rate Route Frequency Ordered Stop   05/04/19 1400  piperacillin-tazobactam (ZOSYN) IVPB 3.375 g     3.375 g 12.5 mL/hr over 240 Minutes Intravenous Every 8 hours 05/04/19 1304     04/29/19 1400  piperacillin-tazobactam (ZOSYN) IVPB 2.25 g  Status:  Discontinued     2.25 g 100 mL/hr over 30 Minutes Intravenous Every 8 hours 04/29/19 1101 05/04/19 1304   04/27/19 1000  cefTRIAXone (ROCEPHIN) 2 g in sodium chloride 0.9 % 100 mL IVPB  Status:  Discontinued     2 g 200 mL/hr over 30 Minutes Intravenous Every 24 hours 04/27/19 0859 04/29/19 1101   04/27/19 1000  metroNIDAZOLE (FLAGYL) IVPB 500 mg  Status:  Discontinued     500 mg 100 mL/hr over 60 Minutes Intravenous Every 8 hours 04/27/19 0859 04/29/19 1101   04/26/19 2200  piperacillin-tazobactam (ZOSYN) IVPB 3.375 g  Status:  Discontinued     3.375 g 12.5 mL/hr over 240 Minutes Intravenous Every 12 hours 04/26/19 1253 04/27/19 0858   04/26/19 1800  aztreonam (AZACTAM) 1 g in sodium chloride 0.9 % 100 mL IVPB  Status:  Discontinued     1 g 200 mL/hr over 30 Minutes Intravenous Every 12 hours 04/26/19 0805 04/26/19 1253   04/25/19 2030  ciprofloxacin (CIPRO) IVPB 200 mg  Status:  Discontinued     200  mg 100 mL/hr over 60 Minutes Intravenous Every 12 hours 04/25/19 1109 04/25/19 1640   04/25/19 2030  aztreonam (AZACTAM) 1 g in sodium chloride 0.9 % 100 mL IVPB  Status:  Discontinued     1 g 200  mL/hr over 30 Minutes Intravenous Every 8 hours 04/25/19 1651 04/26/19 0805   04/25/19 1730  aztreonam (AZACTAM) 2 g in sodium chloride 0.9 % 100 mL IVPB  Status:  Discontinued     2 g 200 mL/hr over 30 Minutes Intravenous  Once 04/25/19 1640 04/25/19 1651   04/25/19 1600  metroNIDAZOLE (FLAGYL) IVPB 500 mg  Status:  Discontinued     500 mg 100 mL/hr over 60 Minutes Intravenous Every 8 hours 04/25/19 1109 04/26/19 1253   04/25/19 0730  ciprofloxacin (CIPRO) IVPB 400 mg     400 mg 200 mL/hr over 60 Minutes Intravenous  Once 04/25/19 0729 04/25/19 0921   04/25/19 0730  metroNIDAZOLE (FLAGYL) IVPB 500 mg     500 mg 100 mL/hr over 60 Minutes Intravenous  Once 04/25/19 0729 04/25/19 [id number]      Subjective: Patient seen and examined at bedside.  Complains of intermittent abdominal pain.  No overnight fever or vomiting.  Sleepy, wakes up only very slightly.  Poor historian. Objective: Vitals:   05/06/19 0055 05/06/19 0355 05/06/19 0458 05/06/19 0812  BP:  (!) 148/83    Pulse:      Resp:  15    Temp: 98.4 F (36.9 C) 98.4 F (36.9 C)  98.6 F (37 C)  TempSrc: Oral Oral  Oral  SpO2:      Weight:   85.7 kg   Height:        Intake/Output Summary (Last 24 hours) at 05/06/2019 0816 Last data filed at 05/06/2019 0500 Gross per 24 hour  Intake 1691.69 ml  Output 445 ml  Net 1246.69 ml   Filed Weights   05/04/19 0544 05/05/19 0504 05/06/19 0458  Weight: 87 kg 88 kg 85.7 kg    Examination:  General exam: No acute distress.  Elderly female lying in bed.  Sleepy, with evaluate slightly.  Poor historian.   Respiratory system: Bilateral decreased breath sounds at bases.  No wheezing.  Some crackles.   Cardiovascular system: Rate controlled, S1-S2 heard Gastrointestinal system: Abdomen is nondistended, soft and has dressing present. Normal bowel sounds heard.  Colostomy present Extremities: No cyanosis, edema    Data Reviewed: I have personally reviewed following labs and imaging studies   CBC: Recent Labs  Lab 05/02/19 0805 05/03/19 0348 05/04/19 0210 05/05/19 0233 05/06/19 0223  WBC 49.5* 40.4* 35.0* 36.8* 28.8*  NEUTROABS  --   --   --   --  25.6*  HGB 9.1* 9.8* 8.8* 8.5* 7.9*  HCT 26.3* 27.8* 25.8* 25.0* 22.6*  MCV 89.5 87.7 89.0 90.3 89.7  PLT 277 342 435* 538* 584*   Basic Metabolic Panel: Recent Labs  Lab 05/02/19 0805 05/03/19 0348 05/04/19 0210 05/05/19 0233 05/06/19 0223  NA 139 135 134* 135 138  K 3.2* 4.3 3.8 3.0* 3.2*  CL 108 103 105 100 102  CO2 21* 21* 21* 23 26  GLUCOSE 173* 82 85 107* 89  BUN 33* 25* 19 15 11   CREATININE 0.95 0.75 0.62 0.62 0.56  CALCIUM 7.3* 7.6* 7.2* 7.4* 7.7*  MG 1.8  --   --  1.5* 1.7  PHOS 2.0* 2.4* 3.1 2.7 2.5   GFR: Estimated Creatinine Clearance: 51.2 mL/min (by C-G formula based on SCr of 0.56  mg/dL). Liver Function Tests: Recent Labs  Lab 04/30/19 1501 05/01/19 0725 05/02/19 0805 05/03/19 0348 05/04/19 0210  ALBUMIN 1.7* 1.8* 1.6* 1.6* 1.6*   No results for input(s): LIPASE, AMYLASE in the last 168 hours. No results for input(s): AMMONIA in the last 168 hours. Coagulation Profile: No results for input(s): INR, PROTIME in the last 168 hours. Cardiac Enzymes: No results for input(s): CKTOTAL, CKMB, CKMBINDEX, TROPONINI in the last 168 hours. BNP (last 3 results) No results for input(s): PROBNP in the last 8760 hours. HbA1C: No results for input(s): HGBA1C in the last 72 hours. CBG: Recent Labs  Lab 05/05/19 0741 05/05/19 1238 05/05/19 2049 05/06/19 0055 05/06/19 0457  GLUCAP 105* 114* 112* 95 91   Lipid Profile: No results for input(s): CHOL, HDL, LDLCALC, TRIG, CHOLHDL, LDLDIRECT in the last 72 hours. Thyroid Function Tests: No results for input(s): TSH, T4TOTAL, FREET4, T3FREE, THYROIDAB in the last 72 hours. Anemia Panel: No results for input(s): VITAMINB12, FOLATE, FERRITIN, TIBC, IRON, RETICCTPCT in the last 72 hours. Sepsis Labs: No results for input(s): PROCALCITON, LATICACIDVEN  in the last 168 hours.  No results found for this or any previous visit (from the past 240 hour(s)).       Radiology Studies: No results found.      Scheduled Meds: . sodium chloride   Intravenous Once  . acetaminophen  1,000 mg Oral Q8H  . Chlorhexidine Gluconate Cloth  6 each Topical Daily  . feeding supplement (ENSURE ENLIVE)  237 mL Oral TID BM  . feeding supplement (VITAL HIGH PROTEIN)  1,000 mL Per Tube Q24H  . methocarbamol  500 mg Oral TID  . multivitamin with minerals  1 tablet Oral Daily  . saccharomyces boulardii  250 mg Oral BID   Continuous Infusions: . piperacillin-tazobactam (ZOSYN)  IV 3.375 g (05/06/19 0600)          Glade Lloyd, MD [facility] 05/06/2019, 8:16 AM

## 2019-05-06 NOTE — Progress Notes (Signed)
Rehab Admissions Coordinator Note:  Per PT recommendation, this patient was screened by Raechel Ache for appropriateness for an Inpatient Acute Rehab Consult.  At this time, we are recommending Inpatient Rehab consult. AC will contact MD to request order.   Raechel Ache 05/06/2019, 5:52 PM  I can be reached at [phone].

## 2019-05-06 NOTE — Progress Notes (Addendum)
Nutrition Follow-up  DOCUMENTATION CODES:   Obesity unspecified  INTERVENTION:   -Continue Ensure Enlive po TID, each supplement provides 350 kcal and 20 grams of protein -Magic cup TID with meals, each supplement provides 290 kcal and 9 grams of protein -Continue MVI with minerals daily -Pudding with meals, per pt request -Initiate 48 hour calorie count per MD -Continue trickle feedings of Vital High Protein @ 20 ml/hr via g-tube  Tube feeding regimen provides480kcal (31% of needs),42grams of protein, and 448ml of H2O.   NUTRITION DIAGNOSIS:   Increased nutrient needs related to post-op healing as evidenced by estimated needs.  Ongoing  GOAL:   Patient will meet greater than or equal to 90% of their needs  Progressing   MONITOR:   PO intake, Supplement acceptance, Labs, Weight trends, TF tolerance, Skin, I & O's  REASON FOR ASSESSMENT:   Consult Calorie Count  ASSESSMENT:   Joanne Burke is a 85 y.o. female with no significant medical history presenting with abdominal pain.   She had abdominal pain in the summer and it resolved; she had swelling at the time.  About 9 days ago, she started with LLQ pain with n/v, decreased PO.  She has not been to the doctor in years but the pain was finally so bad that she had to come.  No fevers.  9/30- s/pProcedure: Exporter laparotomy with sigmoid colectomy, colostomy and Stamm gastrostomy tube placement Mobilization of splenic flexure 10/4- trickle TF started 10/5- advanced to clear liquids 10/6- advanced to full liquids 10/8- advanced to soft diet  Trickle feedings of Vital High Protein continue @ 20 ml/hr infusing via g-tube. Regimen providing 480kcal (31% of needs),42grams of protein, and 470ml of H2O. Per general surgery notes, no plans to advance TF today. MD requesting calorie count to better assess oral intake.   Spoke with pt at bedside, who was more interactive with this RD today. She denies any nausea,  vomiting, or abdominal pain. Pt reports that she has not been receiving meal trays and did not receive any breakfast today. She shares little to no desire to eat ("when my liquids come, they just sit there"). Confirmed that pt did not receive breakfast this AM; nutritional services are now delivering scheduled meals to pt.   Pt consumed 90% of Ensure supplement (315 kcals, 18 grams protein). Per pt, she enjoys them, but does not like the chocolate flavor. Also provided pt with lemon lime soda per her request.   Discussed importance of good meal and supplement intake to promote healing.   Labs reviewed: K: 3.2, CBGS: 91-99.   Diet Order:   Diet Order            DIET SOFT Room service appropriate? No; Fluid consistency: Thin  Diet effective now              EDUCATION NEEDS:   Education needs have been addressed  Skin:  Skin Assessment: Skin Integrity Issues: Skin Integrity Issues:: Incisions Incisions: closed abdomen  Last BM:  05/05/19 (via colostomy)  Height:   Ht Readings from Last 1 Encounters:  04/25/19 5\' 2"  (1.575 m)    Weight:   Wt Readings from Last 1 Encounters:  05/06/19 85.7 kg    Ideal Body Weight:  50 kg  BMI:  Body mass index is 34.56 kg/m.  Estimated Nutritional Needs:   Kcal:  1550-1750  Protein:  85-100 grams  Fluid:  > 1.5 L    Justene Jensen A. Jimmye Norman, RD, LDN, [place] Registered Dietitian  II Certified Diabetes Care and Education Specialist Pager: [phone] After hours Pager: [phone]

## 2019-05-06 NOTE — Evaluation (Signed)
Physical Therapy Evaluation Patient Details Name: Joanne Burke MRN: [id number] DOB: 11-Jan-1931 Today's Date: 05/06/2019   History of Present Illness  85 y.o. female with no significant medical history presenting with abdominal pain. Pt admitted for diverticulitis of colon with perforation, sepsis. On 04/28/19 she underwent sigmoid colectomy, colostomy and Stamm gastrostomy tube placement.  Clinical Impression  Pt progressing steadily towards her goals. Session focused on transfer training and lower extremity exercises for strengthening. Requiring moderate assist for stand pivot transfers using walker. Pt displays generalized weakness and continued abdominal pain.   Had a long talk with pt husband and pt regarding discharge planning. Pt husband does not want pt to discharge to a SNF due to fears about Covid but does acknowledge need for post acute rehab. Pt husband also has limited mobility himself due to prior surgeries. CIR screen placed given pt was independent and active prior to surgery; she enjoys doing yard work. Do highly recommend post acute rehab course given pt deficits and decreased ability of caregiver to physically assist her. Would need to get to supervision level.     Follow Up Recommendations Supervision/Assistance - 24 hour;CIR    Equipment Recommendations  Rolling walker with 5" wheels;3in1 (PT)    Recommendations for Other Services       Precautions / Restrictions Precautions Precautions: Fall Precaution Comments: bulb drain, colostomy, G tube Restrictions Weight Bearing Restrictions: No      Mobility  Bed Mobility Overal bed mobility: Needs Assistance Bed Mobility: Supine to Sit;Sit to Supine     Supine to sit: Min guard Sit to supine: Min assist   General bed mobility comments: Increased time and effort, use of rail. Min assist for BLE elevation back in bed due to pain  Transfers Overall transfer level: Needs assistance Equipment used: Rolling walker  (2 wheeled) Transfers: Sit to/from [employer] Sit to Stand: Mod assist Stand pivot transfers: Mod assist       General transfer comment: Max cues for hand placement. Pt needing to use momentum to bring self up, modA to rise and pivot towards BSC. Decreased eccentric control noted  Ambulation/Gait                Stairs            Wheelchair Mobility    Modified Rankin (Stroke Patients Only)       Balance Overall balance assessment: Needs assistance Sitting-balance support: Bilateral upper extremity supported;Feet unsupported Sitting balance-Leahy Scale: Fair Sitting balance - Comments: posterior bias initially   Standing balance support: Bilateral upper extremity supported;During functional activity Standing balance-Leahy Scale: Poor                               Pertinent Vitals/Pain Pain Assessment: Faces Faces Pain Scale: Hurts even more Pain Location: abdomen Pain Descriptors / Indicators: Grimacing;Guarding;Discomfort Pain Intervention(s): Monitored during session;Limited activity within patient's tolerance    Home Living                        Prior Function                 Hand Dominance        Extremity/Trunk Assessment                Communication      Cognition Arousal/Alertness: Awake/alert Behavior During Therapy: WFL for tasks assessed/performed Overall Cognitive Status: Impaired/Different from baseline Area  of Impairment: Memory;Following commands;Problem solving                     Memory: Decreased short-term memory Following Commands: Follows one step commands with increased time     Problem Solving: Slow processing;Difficulty sequencing General Comments: Decreased short term memory recall noted      General Comments      Exercises General Exercises - Lower Extremity Short Arc Quad: Both;10 reps;Supine Heel Slides: Both;10 reps;Supine Hip ABduction/ADduction:  Both;10 reps;Supine Straight Leg Raises: Both;5 reps;Supine   Assessment/Plan    PT Assessment    PT Problem List         PT Treatment Interventions      PT Goals (Current goals can be found in the Care Plan section)  Acute Rehab PT Goals Patient Stated Goal: decrease pain Potential to Achieve Goals: Good    Frequency Min 3X/week   Barriers to discharge        Co-evaluation               AM-PAC PT "6 Clicks" Mobility  Outcome Measure Help needed turning from your back to your side while in a flat bed without using bedrails?: A Little Help needed moving from lying on your back to sitting on the side of a flat bed without using bedrails?: A Little Help needed moving to and from a bed to a chair (including a wheelchair)?: A Lot Help needed standing up from a chair using your arms (e.g., wheelchair or bedside chair)?: A Little Help needed to walk in hospital room?: A Lot Help needed climbing 3-5 steps with a railing? : Total 6 Click Score: 14    End of Session Equipment Utilized During Treatment: Gait belt Activity Tolerance: Patient tolerated treatment well Patient left: with call bell/phone within reach;in bed;with bed alarm set;with family/visitor present Nurse Communication: Mobility status PT Visit Diagnosis: Other abnormalities of gait and mobility (R26.89);Pain;Muscle weakness (generalized) (M62.81)    Time: 8101-7510 PT Time Calculation (min) (ACUTE ONLY): 39 min   Charges:     PT Treatments $Therapeutic Exercise: 8-22 mins $Therapeutic Activity: 23-37 mins        Ellamae Sia, PT, DPT Acute Rehabilitation Services Pager [phone] Office [phone]   Willy Eddy 05/06/2019, 5:16 PM

## 2019-05-06 NOTE — Progress Notes (Signed)
[facility]/Trauma Progress Note  8 Days Post-Op   Assessment/Plan Sepsis Encephalopathy AKI-per medicine ABL anemia -Received 2 pRBC and 1 FFP9/30,Hgb 7.9 today Malnutrition-prealbumin 20.9  HxConstipation Tobacco abuse   Severe sigmoid diverticulitis with multiple pelvic abscesses POD#8,exploratory laparotomywith sigmoid colectomy, colostomy, and Stamm gastrostomy tube placement, Dr. Luisa Hart 9/30 for expected severe diverticulitis with multiple pelvic abscesses -Pathology showed diverticulitis -Stoma viable - BID wet to dry dressing changes. Continue abdominal binder to protect G tube - monitor JP drainage - Leukocytosis improving down to 28.8 today from 36.8 yesterday   ID -cipro9/27 x1,flagyl9/27>>10/1, aztreonam 9/27>>9/28, zosyn 9/28>>9/29, rocephin 9/29>>10/1; zosyn10/1>> VTE -SCDs, holding chem proph due to slow drop in Hgb, 7.9 today FEN -soft diet, trickle tube feeds until pt is eating more, calorie count Foley -none Follow up -Dr. Luisa Hart  Plan: I will leave the tube feedings at 20 cc/h put her on a soft diet. Calorie count.  Continue to mobilize.    LOS: 11 days    Subjective: CC: mild abdominal pain  Abdominal binder is irritating her armpits. She didn't get breakfast and wants something to eat. I talked to the nurse about helping her order food.   Objective: Vital signs in last 24 hours: Temp:  [97.7 F (36.5 C)-98.6 F (37 C)] 97.9 F (36.6 C) (10/08 0825) Pulse Rate:  [73-88] 73 (10/08 0825) Resp:  [14-24] 14 (10/08 0825) BP: (111-151)/(58-83) 111/58 (10/08 0825) SpO2:  [94 %-99 %] 94 % (10/08 0825) Weight:  [85.7 kg] 85.7 kg (10/08 0458) Last BM Date: (ostomy bag)  Intake/Output from previous day: 10/07 0701 - 10/08 0700 In: 1691.7 [P.O.:450; NG/GT:1197.9; IV Piggyback:43.8] Out: 445 [Urine:300; Drains:20; Stool:125] Intake/Output this shift: No intake/output data recorded.  PE: Gen:  Alert, NAD,  pleasant, cooperative Pulm:  Rate and effort normal Abd: Soft, ND, midline is clean, stoma red. Gas and stool in bag. Mild generalized TTP without guarding. JP drain  Skin: no rashes noted, warm and dry   Anti-infectives: Anti-infectives (From admission, onward)   Start     Dose/Rate Route Frequency Ordered Stop   05/04/19 1400  piperacillin-tazobactam (ZOSYN) IVPB 3.375 g     3.375 g 12.5 mL/hr over 240 Minutes Intravenous Every 8 hours 05/04/19 1304     04/29/19 1400  piperacillin-tazobactam (ZOSYN) IVPB 2.25 g  Status:  Discontinued     2.25 g 100 mL/hr over 30 Minutes Intravenous Every 8 hours 04/29/19 1101 05/04/19 1304   04/27/19 1000  cefTRIAXone (ROCEPHIN) 2 g in sodium chloride 0.9 % 100 mL IVPB  Status:  Discontinued     2 g 200 mL/hr over 30 Minutes Intravenous Every 24 hours 04/27/19 0859 04/29/19 1101   04/27/19 1000  metroNIDAZOLE (FLAGYL) IVPB 500 mg  Status:  Discontinued     500 mg 100 mL/hr over 60 Minutes Intravenous Every 8 hours 04/27/19 0859 04/29/19 1101   04/26/19 2200  piperacillin-tazobactam (ZOSYN) IVPB 3.375 g  Status:  Discontinued     3.375 g 12.5 mL/hr over 240 Minutes Intravenous Every 12 hours 04/26/19 1253 04/27/19 0858   04/26/19 1800  aztreonam (AZACTAM) 1 g in sodium chloride 0.9 % 100 mL IVPB  Status:  Discontinued     1 g 200 mL/hr over 30 Minutes Intravenous Every 12 hours 04/26/19 0805 04/26/19 1253   04/25/19 2030  ciprofloxacin (CIPRO) IVPB 200 mg  Status:  Discontinued     200 mg 100 mL/hr over 60 Minutes Intravenous Every 12 hours 04/25/19 1109 04/25/19 1640   04/25/19  2030  aztreonam (AZACTAM) 1 g in sodium chloride 0.9 % 100 mL IVPB  Status:  Discontinued     1 g 200 mL/hr over 30 Minutes Intravenous Every 8 hours 04/25/19 1651 04/26/19 0805   04/25/19 1730  aztreonam (AZACTAM) 2 g in sodium chloride 0.9 % 100 mL IVPB  Status:  Discontinued     2 g 200 mL/hr over 30 Minutes Intravenous  Once 04/25/19 1640 04/25/19 1651   04/25/19 1600   metroNIDAZOLE (FLAGYL) IVPB 500 mg  Status:  Discontinued     500 mg 100 mL/hr over 60 Minutes Intravenous Every 8 hours 04/25/19 1109 04/26/19 1253   04/25/19 0730  ciprofloxacin (CIPRO) IVPB 400 mg     400 mg 200 mL/hr over 60 Minutes Intravenous  Once 04/25/19 0729 04/25/19 0921   04/25/19 0730  metroNIDAZOLE (FLAGYL) IVPB 500 mg     500 mg 100 mL/hr over 60 Minutes Intravenous  Once 04/25/19 0729 04/25/19 0924      Lab Results:  Recent Labs    05/05/19 0233 05/06/19 0223  WBC 36.8* 28.8*  HGB 8.5* 7.9*  HCT 25.0* 22.6*  PLT 538* 584*   BMET Recent Labs    05/05/19 0233 05/06/19 0223  NA 135 138  K 3.0* 3.2*  CL 100 102  CO2 23 26  GLUCOSE 107* 89  BUN 15 11  CREATININE 0.62 0.56  CALCIUM 7.4* 7.7*   PT/INR No results for input(s): LABPROT, INR in the last 72 hours. CMP     Component Value Date/Time   NA 138 05/06/2019 0223   K 3.2 (L) 05/06/2019 0223   CL 102 05/06/2019 0223   CO2 26 05/06/2019 0223   GLUCOSE 89 05/06/2019 0223   BUN 11 05/06/2019 0223   CREATININE 0.56 05/06/2019 0223   CALCIUM 7.7 (L) 05/06/2019 0223   PROT 6.1 (L) 04/25/2019 0355   ALBUMIN 1.6 (L) 05/04/2019 0210   AST 46 (H) 04/25/2019 0355   ALT 33 04/25/2019 0355   ALKPHOS 237 (H) 04/25/2019 0355   BILITOT 1.7 (H) 04/25/2019 0355   GFRNONAA >60 05/06/2019 0223   GFRAA >60 05/06/2019 0223   Lipase     Component Value Date/Time   LIPASE 15 04/25/2019 0355    Studies/Results: No results found.   Kalman Drape, [facility] Surgery Pager [phone] Cristine Polio, & Friday 7:00am - 4:30pm Thursdays 7:00am -11:30am  Consults: [phone]

## 2019-05-06 NOTE — Progress Notes (Signed)
During med administration patient indicated restlessness and desire to sleep. Encouraged patient to rest and relax after medication administration. Patient still restless at 2300 and complaining of inability to sleep and requesting sleep aid. Paged NP [place] at 2315, informed patient complaining of inability to sleep and needs something to help her sleep. No current PRNs. PRN med?   Approximately 0010 patient requesting sleep aid, informed awaiting med from pharmacy. Patient then indicated soiled (urine) and distressed about poor sleep and burden on staff to complete a  bed change. NT and RN comforted patient and completed bed bath and bed change. Patient requested sleep aid be given after bed bath/ bed change given.   0142 rounded on patient, patient calm, but awake. Patient stated still had not gone to sleep. Upon inquiry, patient stated sometimes has this problem at home. Stated has also happened since she has been in the hospital where she sleeps ok then has trouble sleeping and has feeling like she needs to get up and run. Inquired if patient felt anxious, patient stated could not describe feeling. Offered and provided lavender essential oil to help patient rest/calm. Patient appeared somewhat drowsy.   Lost IV access to infiltration at approx 0450 prior to administering Ativan for patient's anxiety. IV team consulted. Per IV team, unable to establish midline due to patient's edema and tension during insertion attempt. IV team able to establish periph IV, but recommended central line if any more difficulty with any further IV access. Informed day RN patient may need central line.   Patient drowsy and sleeping intermittently at 0650, therefore, Ativan not given.   Overall poor sleep overnight. Informed day RN of patient's need for possible scheduled sleep aid to assist with establishing sleeping regimen while inpatient.   Scant serousangineous drainage from abdominal wound dressing.    Serosangineous drainage in JP drain, approx 20 mL, overnight. Tenderness at JP drain site.   Dark green/brown stool in ostomy bag. No discomfort at site overnight.

## 2019-05-06 NOTE — Progress Notes (Signed)
Cardiac monitoring discontinued per MD order.

## 2019-05-06 NOTE — Progress Notes (Addendum)
NCM spoke pt and pt's husband @ bedside 05/06/2019 regarding TOC needs. Husband states has he changed his mind about SNF placement for wife. States she will come home with him and he will take care of her as he has done in the past. Agreeable to home health services. Choice offered, no preference. Referral made with [facility] and accepted pending MD's orders... NCM to f/u with obtaining orders. Whitman Hero RN,BSN,CM

## 2019-05-07 LAB — CBC WITH DIFFERENTIAL/PLATELET
Abs Immature Granulocytes: 0.36 10*3/uL — ABNORMAL HIGH (ref 0.00–0.07)
Basophils Absolute: 0.1 10*3/uL (ref 0.0–0.1)
Basophils Relative: 0 %
Eosinophils Absolute: 0.1 10*3/uL (ref 0.0–0.5)
Eosinophils Relative: 1 %
HCT: 22.6 % — ABNORMAL LOW (ref 36.0–46.0)
Hemoglobin: 7.8 g/dL — ABNORMAL LOW (ref 12.0–15.0)
Immature Granulocytes: 2 %
Lymphocytes Relative: 4 %
Lymphs Abs: 0.9 10*3/uL (ref 0.7–4.0)
MCH: 31.7 pg (ref 26.0–34.0)
MCHC: 34.5 g/dL (ref 30.0–36.0)
MCV: 91.9 fL (ref 80.0–100.0)
Monocytes Absolute: 1.3 10*3/uL — ABNORMAL HIGH (ref 0.1–1.0)
Monocytes Relative: 5 %
Neutro Abs: 21.1 10*3/uL — ABNORMAL HIGH (ref 1.7–7.7)
Neutrophils Relative %: 88 %
Platelets: 672 10*3/uL — ABNORMAL HIGH (ref 150–400)
RBC: 2.46 MIL/uL — ABNORMAL LOW (ref 3.87–5.11)
RDW: 18.9 % — ABNORMAL HIGH (ref 11.5–15.5)
WBC: 23.8 10*3/uL — ABNORMAL HIGH (ref 4.0–10.5)
nRBC: 0 % (ref 0.0–0.2)

## 2019-05-07 LAB — BASIC METABOLIC PANEL
Anion gap: 10 (ref 5–15)
BUN: 12 mg/dL (ref 8–23)
CO2: 25 mmol/L (ref 22–32)
Calcium: 8 mg/dL — ABNORMAL LOW (ref 8.9–10.3)
Chloride: 102 mmol/L (ref 98–111)
Creatinine, Ser: 0.61 mg/dL (ref 0.44–1.00)
GFR calc Af Amer: >60 mL/min (ref >60)
GFR calc non Af Amer: >60 mL/min (ref >60)
Glucose, Bld: 117 mg/dL — ABNORMAL HIGH (ref 70–99)
Potassium: 4 mmol/L (ref 3.5–5.1)
Sodium: 137 mmol/L (ref 135–145)

## 2019-05-07 LAB — GLUCOSE, CAPILLARY
Glucose-Capillary: 108 mg/dL — ABNORMAL HIGH (ref 70–99)
Glucose-Capillary: 111 mg/dL — ABNORMAL HIGH (ref 70–99)
Glucose-Capillary: 112 mg/dL — ABNORMAL HIGH (ref 70–99)
Glucose-Capillary: 136 mg/dL — ABNORMAL HIGH (ref 70–99)
Glucose-Capillary: 140 mg/dL — ABNORMAL HIGH (ref 70–99)
Glucose-Capillary: 81 mg/dL (ref 70–99)

## 2019-05-07 LAB — MAGNESIUM: Magnesium: 1.6 mg/dL — ABNORMAL LOW (ref 1.7–2.4)

## 2019-05-07 LAB — PHOSPHORUS: Phosphorus: 2.4 mg/dL — ABNORMAL LOW (ref 2.5–4.6)

## 2019-05-07 MED ORDER — MAGNESIUM SULFATE 2 GM/50ML IV SOLN
2.0000 g | Freq: Once | INTRAVENOUS | Status: AC
  Administered 2019-05-07: 2 g via INTRAVENOUS
  Filled 2019-05-07: qty 50

## 2019-05-07 MED ORDER — DIPHENHYDRAMINE HCL 25 MG PO CAPS
25.0000 mg | ORAL_CAPSULE | Freq: Every evening | ORAL | Status: DC | PRN
  Administered 2019-05-07: 22:00:00 25 mg via ORAL
  Filled 2019-05-07: qty 1

## 2019-05-07 MED ORDER — VITAL AF 1.2 CAL PO LIQD
1000.0000 mL | ORAL | Status: DC
  Administered 2019-05-07 – 2019-05-08 (×2): 1000 mL
  Filled 2019-05-07 (×4): qty 1000

## 2019-05-07 MED ORDER — TRAZODONE HCL 50 MG PO TABS: 50.0000 mg | ORAL_TABLET | Freq: Every evening | ORAL | Status: DC | PRN

## 2019-05-07 NOTE — Progress Notes (Addendum)
9 Days Post-Op    CC: Recurrent/intolerable left lower quadrant abdominal pain  Subjective: Patient seen in bed, she says she had a bad night she could not sleep.  Her tube feedings at 20 cc/h.  She is taking a soft diet although does not look like she is eating very much.  Her midline incision looks stable.  JP drain is clear.  Colostomy looks good and is working well. She is a 1 person assist in bed. Objective: Vital signs in last 24 hours: Temp:  [98.4 F (36.9 C)-98.6 F (37 C)] 98.4 F (36.9 C) (10/09 0817) Pulse Rate:  [66-86] 77 (10/09 0817) Resp:  [16] 16 (10/09 0817) BP: (114-135)/(60-68) 126/60 (10/09 0817) SpO2:  [95 %-99 %] 99 % (10/09 0817) Weight:  [86.5 kg] 86.5 kg (10/09 0439) Last BM Date: (ostomy) 530 p.o. 150 urine 200 urine Stool in 50 recorded Afebrile vital signs are stable Mag 1.6 WBC down to 23.8 H/H stable  Intake/Output from previous day: 10/08 0701 - 10/09 0700 In: 680 [P.O.:530; IV Piggyback:150] Out: 350 [Urine:200; Stool:150] Intake/Output this shift: Total I/O In: -  Out: 250 [Urine:250]  General appearance: alert, cooperative, no distress and Anxious Resp: clear to auscultation bilaterally GI: She is soft but she is tender especially in the right upper quadrant.  Lab Results:  Recent Labs    05/06/19 0223 05/07/19 0312  WBC 28.8* 23.8*  HGB 7.9* 7.8*  HCT 22.6* 22.6*  PLT 584* 672*    BMET Recent Labs    05/06/19 0223 05/07/19 0312  NA 138 137  K 3.2* 4.0  CL 102 102  CO2 26 25  GLUCOSE 89 117*  BUN 11 12  CREATININE 0.56 0.61  CALCIUM 7.7* 8.0*   PT/INR No results for input(s): LABPROT, INR in the last 72 hours.  Recent Labs  Lab 04/30/19 1501 05/01/19 0725 05/02/19 0805 05/03/19 0348 05/04/19 0210  ALBUMIN 1.7* 1.8* 1.6* 1.6* 1.6*     Lipase     Component Value Date/Time   LIPASE 15 04/25/2019 0355     Medications: . acetaminophen  1,000 mg Oral Q8H  . Chlorhexidine Gluconate Cloth  6 each  Topical Daily  . feeding supplement (ENSURE ENLIVE)  237 mL Oral TID BM  . feeding supplement (VITAL HIGH PROTEIN)  1,000 mL Per Tube Q24H  . methocarbamol  500 mg Oral TID  . multivitamin with minerals  1 tablet Oral Daily  . saccharomyces boulardii  250 mg Oral BID    Assessment/Plan Sepsis Encephalopathy AKI-Cr2.08(10/1)>>0.75(10/5)>>renal is signing off. ABL anemia -Received 2 pRBC and 1 FFP9/30,. Black tarry stool10/4;H/H 9.4/26.8(10/1) >>9.8/27.8(10/5) Malnutrition-prealbumin20.9 HxConstipation Tobacco abuse   Severe sigmoid diverticulitis with multiple pelvic abscesses; largest 11.4 x 2.4 x 2.1 POD#9,exploratory laparotomywith sigmoid colectomy, colostomy, and Stamm gastrostomy tube placement, Dr. Luisa Hart 9/30 for expected severe diverticulitis with multiple pelvic abscesses -Pathology with diverticulitis -Stoma viable - BID wet to dry dressing changes. Continue abdominal binder to protect G tube - monitor JP drainage -. Add IV ativan PRN anxiety - Leukocytosis  -  23.0(10/1)>>45.8(10/3)>>49.5(10/4)>>40.4(10/5)>>35.0(10/6)>>23.8(10/9)  ID -cipro9/27 x1,flagyl9/27>>10/1, aztreonam 9/27>>9/28, zosyn 9/28>>9/29, rocephin 9/29>>10/1; zosyn10/1>>(likely will need 10-14 days total of abx, however will discuss with MD duration of abx) She has 12 days of antibiotics -  Stopping on 05/07/19. VTE -SCDs, Hgb stable x 72 hours (last hgb 10/3). Plt 174. Await CBC given black stools before considering starting chemical prophylaxis. FEN -soft diet Foley -foley  Follow up -Dr. Luisa Hart Foley: out  Plan: Her binder is  up around her breast, I do not think she needs it.  I will  discontinue it for now.  She is on tube feedings so I wonder if  this is suppressing her appetite.  She has been on Zosyn since 04/29/19, POD #1.  Discussed with Dr. Ninfa Linden and we will stop the antibiotics.  There is a consult for CIR.      LOS: 12 days     Joanne Burke 05/07/2019 [phone]

## 2019-05-07 NOTE — Progress Notes (Signed)
Physical Therapy Treatment Patient Details Name: Joanne Burke MRN: [id number] DOB: [birth year] Today's Date: 05/07/2019    History of Present Illness 85 y.o. female with no significant medical history presenting with abdominal pain. Pt admitted for diverticulitis of colon with perforation, sepsis. On 04/28/19 she underwent sigmoid colectomy, colostomy and Stamm gastrostomy tube placement.    PT Comments    Pt progressing steadily towards physical therapy goals, remains motivated to participate. Requiring moderate assist for transfers, ambulating limited distance to chair with walker and min assist. Also focused on lower extremity strengthening exercises. Pt remains limited by generalized weakness, abdominal pain, decreased endurance. Continue to recommend comprehensive inpatient rehab (CIR) for post-acute therapy needs.     Follow Up Recommendations  Supervision/Assistance - 24 hour;CIR     Equipment Recommendations  Rolling walker with 5" wheels;3in1 (PT)    Recommendations for Other Services       Precautions / Restrictions Precautions Precautions: Fall Precaution Comments: bulb drain, colostomy, G tube Restrictions Weight Bearing Restrictions: No    Mobility  Bed Mobility Overal bed mobility: Needs Assistance Bed Mobility: Supine to Sit     Supine to sit: Min guard     General bed mobility comments: Increased time and effort, use of rail. Able to achieve on 2nd attempt  Transfers Overall transfer level: Needs assistance Equipment used: Rolling walker (2 wheeled) Transfers: Sit to/from Stand Sit to Stand: Mod assist         General transfer comment: Pt pulling up on walker, modA to boost up to stand from edge of bed  Ambulation/Gait Ambulation/Gait assistance: Min assist Gait Distance (Feet): 3 Feet Assistive device: Rolling walker (2 wheeled) Gait Pattern/deviations: Decreased stride length;Step-to pattern;Trunk flexed Gait velocity: decreased   General  Gait Details: Pt taking 3 steps forward, 3 steps back, no overt LOB. Fatigues easily   [employer] Rankin (Stroke Patients Only)       Balance Overall balance assessment: Needs assistance Sitting-balance support: Bilateral upper extremity supported;Feet unsupported Sitting balance-Leahy Scale: Fair     Standing balance support: Bilateral upper extremity supported;During functional activity Standing balance-Leahy Scale: Poor                              Cognition Arousal/Alertness: Awake/alert Behavior During Therapy: WFL for tasks assessed/performed Overall Cognitive Status: Impaired/Different from baseline Area of Impairment: Memory;Problem solving                     Memory: Decreased short-term memory       Problem Solving: Slow processing;Difficulty sequencing General Comments: Decreased short term memory recall noted      Exercises General Exercises - Lower Extremity Heel Slides: Both;10 reps;Supine Hip ABduction/ADduction: Both;10 reps;Supine Straight Leg Raises: Both;5 reps;Supine    General Comments        Pertinent Vitals/Pain Pain Assessment: Faces Faces Pain Scale: Hurts whole lot Pain Location: abdomen Pain Descriptors / Indicators: Grimacing;Guarding;Discomfort Pain Intervention(s): Monitored during session;Repositioned;Limited activity within patient's tolerance    Home Living                      Prior Function            PT Goals (current goals can now be found in the care plan section) Acute Rehab PT Goals Patient Stated Goal: decrease pain Potential to  Achieve Goals: Good Progress towards PT goals: Progressing toward goals    Frequency    Min 3X/week      PT Plan Current plan remains appropriate    Co-evaluation              AM-PAC PT "6 Clicks" Mobility   Outcome Measure  Help needed turning from your back to your side while in a flat bed  without using bedrails?: A Little Help needed moving from lying on your back to sitting on the side of a flat bed without using bedrails?: A Little Help needed moving to and from a bed to a chair (including a wheelchair)?: A Lot Help needed standing up from a chair using your arms (e.g., wheelchair or bedside chair)?: A Little Help needed to walk in hospital room?: A Lot Help needed climbing 3-5 steps with a railing? : Total 6 Click Score: 14    End of Session Equipment Utilized During Treatment: Gait belt Activity Tolerance: Patient tolerated treatment well Patient left: with call bell/phone within reach;in chair;with chair alarm set Nurse Communication: Mobility status PT Visit Diagnosis: Other abnormalities of gait and mobility (R26.89);Pain;Muscle weakness (generalized) (M62.81)     Time: 0932-3557 PT Time Calculation (min) (ACUTE ONLY): 19 min  Charges:  $Therapeutic Activity: 8-22 mins                     Laurina Bustle, PT, DPT Acute Rehabilitation Services Pager [phone] Office [phone]    Joanne Burke 05/07/2019, 5:28 PM

## 2019-05-07 NOTE — Progress Notes (Signed)
Calorie Count Note  48 hour calorie count ordered.  Diet: soft Supplements: MVI with minerals daily; Ensure Enlive po TID, each supplement provides 350 kcal and 20 grams of protein; MVI with minerals daily; Magic cup TID with meals, each supplement provides 290 kcal and 9 grams of protein; Vital High Protein @ 20 ml/hr (480kcal (31% of needs),42grams of protein, and 468ml of H2O)   Pt very somnolent at time of visit. She did not respond to voice. Observed breakfast meal tray- pt consumed only 10% of Magic Cup off of meal tray (29 kcals, 1 gram protein). Of note, she was much more interactive with this RD yesterday and suspect her mentation is contributing to decline in oral intake.   Pt remains on trickle feeds of Vital High Protein @ 20 ml/hr (480 kcals, 42 grams protein, and 401 ml free water daily). Due to very low rate of TF and only providing about 1/3 of estimated needs, do not think TF is suppressing appetite. Pt's appetite has been very poor throughout the week.   Case discussed with Modena Jansky, PA, via secure chat. He reports pt is eating poorly and had a bad night. He emphasized importance of pt getting adequate nutrition. He agrees with RD recommendation to initiate nocturnal feedings.   Day 1 Breakfast: no meal tray  Lunch: 347 kcals, 14 grams Dinner: 349 kcals, 19 grams protein Supplements: 90% of one Ensure supplement (315 kcals, 18 grams protein)  Total intake: 1011 kcal (65% of minimum estimated needs)  51 grams protein (60% of minimum estimated needs)  Total intake (TF + PO's): 1491 kcal (96% of minimum estimated needs)  93 grams protein (100% of minimum estimated needs)  Nutrition Dx: Increased nutrient needs related to post-op healing as evidenced by estimated needs; Ongoing  Goal: Patient will meet greater than or equal to 90% of their needs; Progressing   Intervention:   -Continue calorie count; RD will follow up on Monday, 05/10/19 to further  results -MVI with minerals daily  -Ensure Enlive po TID, each supplement provides 350 kcal and 20 grams of protein  -Magic cup TID with meals, each supplement provides 290 kcal and 9 grams of protein -D/c Vital High Protein -Transition to nocturnal feedings: Vital 1.2 @ 60 ml/hr over 12 hour period (2000-0800)- regimen to provide 864 kcals, 54 grams protein, and 584 ml free water daily, meeting 56% of estimated kcal needs and 64% of estimated protein needs  Joanne Burke A. Jimmye Norman, RD, LDN, [place] Registered Dietitian II Certified Diabetes Care and Education Specialist Pager: [phone] After hours Pager: [phone]

## 2019-05-07 NOTE — Progress Notes (Signed)
Patient said she had difficulty urinating this evening. That she had to "push" to get anything out.  Patient also said that she experienced pain during urination.  MD notified.  Will continue to monitor for symptoms.

## 2019-05-07 NOTE — Consult Note (Signed)
[facility] Nurse ostomy follow up Stoma type/location: RLQ, end ileostomy  Stomal assessment/size: slightly larger than 2" round, pink, budded, os at center Peristomal assessment: intact  Treatment options for stomal/peristomal skin: 2" barrier ring  Output liquid brown Ostomy pouching: 2pc. 2 3/4" with 2" barrier ring  Education provided:  Explained role of ostomy nurse and creation of stoma  Explained stoma characteristics (budded, flush, color, texture, care) Demonstrated pouch change (cutting new skin barrier, measuring stoma, cleaning peristomal skin and stoma, use of barrier ring) Education on emptying when 1/3 to 1/2 full and how to empty Demonstrated use of wick to clean spout  Patient was able to open lock and roll closure several times for [facility] nurse. She was not able to attach pouch to the skin barrier after several attempts  Her husband is not available to teach Will at minimum need HHRN, feel that patient could benefit from CIR for sure to become more proficient with ostomy self care. Unclear if her husband will be able to assist.  Enrolled patient in [place] Start Discharge program: No  WOC Nurse will follow along with you for continued support with ostomy teaching and care [place] MSN, [place], [place], [place], [place]

## 2019-05-07 NOTE — Progress Notes (Signed)
Patient ID: Joanne Burke, female   DOB: 04-10-1931, 85 y.o.   MRN: [id number]  PROGRESS NOTE    SOHANA AUSTELL  WUJ:[id number] DOB: 10-21-1930 DOA: 04/25/2019 PCP: Marden Noble, MD   Brief Narrative:  85 year old female with no significant past medical history presented with abdominal pain.  She was found to have CT evidence of possible diverticulitis, abscess versus fluid in the abdomen along with acute kidney injury, lactic acidosis and leukocytosis.  Surgery was consulted.  Nephrology was also consulted for acute kidney injury.  She underwent exploratory laparotomy with sigmoid colectomy, colostomy and gastrostomy tube placement with mobilization of splenic flexure.  She has been on intravenous antibiotics.  Assessment & Plan:   Sigmoid diverticulitis with multiple pelvic abscesses -Status post exploratory laparotomy with sigmoid colectomy, colostomy and gastrostomy tube placement on 04/28/2019 -General surgery following.  Wound care as per general surgery recommendations.  Continue G-tube feeding. -Currently on IV Zosyn as per general surgery.  Leukocytosis -White cell count elevated but improving, 23.8 today.  Monitor.   AKI Non-anion gap metabolic acidosis -Resolved.  Nephrology signed off.  Successfully passed voiding trial  Hypokalemia -Improved  Hypomagnesemia -Replace.  Repeat a.m. labs  Acute respiratory failure with hypoxia -Resolved. -Chest x-ray showed patchy bilateral airspace opacities concerning for pneumonia on 04/27/2019, could be aspiration pneumonia or pulmonary edema.  She is currently on Zosyn. -Currently on room air.  Generalized conditioning -PT recommends SNF placement.  Family not interested in SNF.  PT now recommending CIR.  CIR consulted.   -Palliative Care consulted for goals of care discussion.  Patient remains full code.  Acute metabolic encephalopathy, likely multifactorial -Most likely secondary to narcotic medication induced in the setting of  recent abdominal surgery along with pain/hyponatremia/AKI.  Had received multiple doses of Ativan for agitation -Mental status is much improved.  Monitor.  Avoid sedative medications as much as possible.   DVT prophylaxis: SCDs Code Status: Full Family Communication: None at bedside Disposition Plan: CIR versus home with home health once cleared by general surgery. Consultants: General surgery/nephrology/palliative care  Procedures:  exploratory laparotomy with sigmoid colectomy, colostomy and gastrostomy tube placement on 04/28/2019  Antimicrobials:  Anti-infectives (From admission, onward)   Start     Dose/Rate Route Frequency Ordered Stop   05/04/19 1400  piperacillin-tazobactam (ZOSYN) IVPB 3.375 g     3.375 g 12.5 mL/hr over 240 Minutes Intravenous Every 8 hours 05/04/19 1304     04/29/19 1400  piperacillin-tazobactam (ZOSYN) IVPB 2.25 g  Status:  Discontinued     2.25 g 100 mL/hr over 30 Minutes Intravenous Every 8 hours 04/29/19 1101 05/04/19 1304   04/27/19 1000  cefTRIAXone (ROCEPHIN) 2 g in sodium chloride 0.9 % 100 mL IVPB  Status:  Discontinued     2 g 200 mL/hr over 30 Minutes Intravenous Every 24 hours 04/27/19 0859 04/29/19 1101   04/27/19 1000  metroNIDAZOLE (FLAGYL) IVPB 500 mg  Status:  Discontinued     500 mg 100 mL/hr over 60 Minutes Intravenous Every 8 hours 04/27/19 0859 04/29/19 1101   04/26/19 2200  piperacillin-tazobactam (ZOSYN) IVPB 3.375 g  Status:  Discontinued     3.375 g 12.5 mL/hr over 240 Minutes Intravenous Every 12 hours 04/26/19 1253 04/27/19 0858   04/26/19 1800  aztreonam (AZACTAM) 1 g in sodium chloride 0.9 % 100 mL IVPB  Status:  Discontinued     1 g 200 mL/hr over 30 Minutes Intravenous Every 12 hours 04/26/19 0805 04/26/19 1253   04/25/19 2030  ciprofloxacin (CIPRO) IVPB 200 mg  Status:  Discontinued     200 mg 100 mL/hr over 60 Minutes Intravenous Every 12 hours 04/25/19 1109 04/25/19 1640   04/25/19 2030  aztreonam (AZACTAM) 1 g in  sodium chloride 0.9 % 100 mL IVPB  Status:  Discontinued     1 g 200 mL/hr over 30 Minutes Intravenous Every 8 hours 04/25/19 1651 04/26/19 0805   04/25/19 1730  aztreonam (AZACTAM) 2 g in sodium chloride 0.9 % 100 mL IVPB  Status:  Discontinued     2 g 200 mL/hr over 30 Minutes Intravenous  Once 04/25/19 1640 04/25/19 1651   04/25/19 1600  metroNIDAZOLE (FLAGYL) IVPB 500 mg  Status:  Discontinued     500 mg 100 mL/hr over 60 Minutes Intravenous Every 8 hours 04/25/19 1109 04/26/19 1253   04/25/19 0730  ciprofloxacin (CIPRO) IVPB 400 mg     400 mg 200 mL/hr over 60 Minutes Intravenous  Once 04/25/19 0729 04/25/19 0921   04/25/19 0730  metroNIDAZOLE (FLAGYL) IVPB 500 mg     500 mg 100 mL/hr over 60 Minutes Intravenous  Once 04/25/19 0729 04/25/19 6256       Subjective: Patient seen and examined at bedside.  Poor historian.  No overnight fever, vomiting, worsening abdominal pain reported.  Does not feel well and stated that she hardly slept overnight and is requesting for medications to help her sleep tonight. Objective: Vitals:   05/06/19 1406 05/06/19 2008 05/07/19 0027 05/07/19 0439  BP: 123/62 135/65 114/68   Pulse: 66 83 86   Resp: 16     Temp: 98.6 F (37 C) 98.5 F (36.9 C)    TempSrc: Oral Oral    SpO2: 95% 98% 99%   Weight:    86.5 kg  Height:        Intake/Output Summary (Last 24 hours) at 05/07/2019 0816 Last data filed at 05/07/2019 0030 Gross per 24 hour  Intake 680 ml  Output 350 ml  Net 330 ml   Filed Weights   05/05/19 0504 05/06/19 0458 05/07/19 0439  Weight: 88 kg 85.7 kg 86.5 kg    Examination:  General exam: No distress.  Elderly female lying in bed.  Poor historian.  Respiratory system: Bilateral decreased breath sounds at bases with some scattered crackles  cardiovascular system: S1-S2 heard, rate controlled Gastrointestinal system: Abdomen is nondistended, soft and has dressing present. Normal bowel sounds heard.  Colostomy present Extremities:  No cyanosis, edema    Data Reviewed: I have personally reviewed following labs and imaging studies  CBC: Recent Labs  Lab 05/03/19 0348 05/04/19 0210 05/05/19 0233 05/06/19 0223 05/07/19 0312  WBC 40.4* 35.0* 36.8* 28.8* 23.8*  NEUTROABS  --   --   --  25.6* 21.1*  HGB 9.8* 8.8* 8.5* 7.9* 7.8*  HCT 27.8* 25.8* 25.0* 22.6* 22.6*  MCV 87.7 89.0 90.3 89.7 91.9  PLT 342 435* 538* 584* 389*   Basic Metabolic Panel: Recent Labs  Lab 05/02/19 0805 05/03/19 0348 05/04/19 0210 05/05/19 0233 05/06/19 0223 05/07/19 0312  NA 139 135 134* 135 138 137  K 3.2* 4.3 3.8 3.0* 3.2* 4.0  CL 108 103 105 100 102 102  CO2 21* 21* 21* 23 26 25   GLUCOSE 173* 82 85 107* 89 117*  BUN 33* 25* 19 15 11 12   CREATININE 0.95 0.75 0.62 0.62 0.56 0.61  CALCIUM 7.3* 7.6* 7.2* 7.4* 7.7* 8.0*  MG 1.8  --   --  1.5* 1.7 1.6*  PHOS 2.0* 2.4* 3.1 2.7 2.5 2.4*   GFR: Estimated Creatinine Clearance: 51.6 mL/min (by C-G formula based on SCr of 0.61 mg/dL). Liver Function Tests: Recent Labs  Lab 04/30/19 1501 05/01/19 0725 05/02/19 0805 05/03/19 0348 05/04/19 0210  ALBUMIN 1.7* 1.8* 1.6* 1.6* 1.6*   No results for input(s): LIPASE, AMYLASE in the last 168 hours. No results for input(s): AMMONIA in the last 168 hours. Coagulation Profile: No results for input(s): INR, PROTIME in the last 168 hours. Cardiac Enzymes: No results for input(s): CKTOTAL, CKMB, CKMBINDEX, TROPONINI in the last 168 hours. BNP (last 3 results) No results for input(s): PROBNP in the last 8760 hours. HbA1C: No results for input(s): HGBA1C in the last 72 hours. CBG: Recent Labs  Lab 05/06/19 1146 05/06/19 1612 05/06/19 2004 05/07/19 0030 05/07/19 0437  GLUCAP 128* 152* 102* 111* 108*   Lipid Profile: No results for input(s): CHOL, HDL, LDLCALC, TRIG, CHOLHDL, LDLDIRECT in the last 72 hours. Thyroid Function Tests: No results for input(s): TSH, T4TOTAL, FREET4, T3FREE, THYROIDAB in the last 72 hours. Anemia Panel:  No results for input(s): VITAMINB12, FOLATE, FERRITIN, TIBC, IRON, RETICCTPCT in the last 72 hours. Sepsis Labs: No results for input(s): PROCALCITON, LATICACIDVEN in the last 168 hours.  No results found for this or any previous visit (from the past 240 hour(s)).       Radiology Studies: No results found.      Scheduled Meds: . acetaminophen  1,000 mg Oral Q8H  . Chlorhexidine Gluconate Cloth  6 each Topical Daily  . feeding supplement (ENSURE ENLIVE)  237 mL Oral TID BM  . feeding supplement (VITAL HIGH PROTEIN)  1,000 mL Per Tube Q24H  . methocarbamol  500 mg Oral TID  . multivitamin with minerals  1 tablet Oral Daily  . saccharomyces boulardii  250 mg Oral BID   Continuous Infusions: . sodium chloride 500 mL (05/07/19 0650)  . piperacillin-tazobactam (ZOSYN)  IV 3.375 g (05/07/19 [id number])          Glade LloydKshitiz Edee Nifong, MD [facility] 05/07/2019, 8:16 AM

## 2019-05-07 NOTE — Progress Notes (Signed)
Inpatient Rehab Admissions:  Inpatient Rehab Consult received.  I met with patient at the bedside for rehabilitation assessment and to discuss goals and expectations of an inpatient rehab admission.  Pt open to CIR and reports her husband will be able to provide 24/7 supervision.  Will open insurance authorization for possible admission next week pending approval and confirmation of dispo.   Signed: Shann Medal, PT, DPT Admissions Coordinator [phone] 05/07/19  12:31 PM

## 2019-05-08 LAB — CBC WITH DIFFERENTIAL/PLATELET
Abs Immature Granulocytes: 0.19 10*3/uL — ABNORMAL HIGH (ref 0.00–0.07)
Basophils Absolute: 0 10*3/uL (ref 0.0–0.1)
Basophils Relative: 0 %
Eosinophils Absolute: 0.2 10*3/uL (ref 0.0–0.5)
Eosinophils Relative: 1 %
HCT: 22.5 % — ABNORMAL LOW (ref 36.0–46.0)
Hemoglobin: 7.2 g/dL — ABNORMAL LOW (ref 12.0–15.0)
Immature Granulocytes: 1 %
Lymphocytes Relative: 5 %
Lymphs Abs: 0.9 10*3/uL (ref 0.7–4.0)
MCH: 30.1 pg (ref 26.0–34.0)
MCHC: 32 g/dL (ref 30.0–36.0)
MCV: 94.1 fL (ref 80.0–100.0)
Monocytes Absolute: 1 10*3/uL (ref 0.1–1.0)
Monocytes Relative: 6 %
Neutro Abs: 15.3 10*3/uL — ABNORMAL HIGH (ref 1.7–7.7)
Neutrophils Relative %: 87 %
Platelets: 733 10*3/uL — ABNORMAL HIGH (ref 150–400)
RBC: 2.39 MIL/uL — ABNORMAL LOW (ref 3.87–5.11)
RDW: 19.1 % — ABNORMAL HIGH (ref 11.5–15.5)
WBC: 17.5 10*3/uL — ABNORMAL HIGH (ref 4.0–10.5)
nRBC: 0 % (ref 0.0–0.2)

## 2019-05-08 LAB — BASIC METABOLIC PANEL
Anion gap: 9 (ref 5–15)
BUN: 14 mg/dL (ref 8–23)
CO2: 25 mmol/L (ref 22–32)
Calcium: 7.8 mg/dL — ABNORMAL LOW (ref 8.9–10.3)
Chloride: 104 mmol/L (ref 98–111)
Creatinine, Ser: 0.55 mg/dL (ref 0.44–1.00)
GFR calc Af Amer: >60 mL/min (ref >60)
GFR calc non Af Amer: >60 mL/min (ref >60)
Glucose, Bld: 119 mg/dL — ABNORMAL HIGH (ref 70–99)
Potassium: 3.7 mmol/L (ref 3.5–5.1)
Sodium: 138 mmol/L (ref 135–145)

## 2019-05-08 LAB — GLUCOSE, CAPILLARY
Glucose-Capillary: 115 mg/dL — ABNORMAL HIGH (ref 70–99)
Glucose-Capillary: 127 mg/dL — ABNORMAL HIGH (ref 70–99)
Glucose-Capillary: 110 mg/dL — ABNORMAL HIGH (ref 70–99)
Glucose-Capillary: 99 mg/dL (ref 70–99)
Glucose-Capillary: 101 mg/dL — ABNORMAL HIGH (ref 70–99)

## 2019-05-08 LAB — MAGNESIUM: Magnesium: 2 mg/dL (ref 1.7–2.4)

## 2019-05-08 LAB — PHOSPHORUS: Phosphorus: 2.7 mg/dL (ref 2.5–4.6)

## 2019-05-08 MED ORDER — TRAZODONE HCL 50 MG PO TABS
50.0000 mg | ORAL_TABLET | Freq: Once | ORAL | Status: AC
  Administered 2019-05-08: 50 mg via ORAL
  Filled 2019-05-08: qty 1

## 2019-05-08 MED ORDER — TRAZODONE HCL 50 MG PO TABS
25.0000 mg | ORAL_TABLET | Freq: Every evening | ORAL | Status: DC | PRN
  Administered 2019-05-13 – 2019-05-14 (×2): 25 mg via ORAL
  Filled 2019-05-08 (×4): qty 1

## 2019-05-08 NOTE — Progress Notes (Signed)
Pt is having difficulty hearing.    Her Husband called earlier and stated the the doctor today told his wife that "she needed to think about dying."  The husband was upset and I told him I would talk to patient.  Pt has intermittent difficulty with hearing, she states wax builds up in her ears.  She plugs her nose and exhales and then can hear better.    She said the doctor asked her if her hear stopped what she would want [place] to do."He shouldn't ask that kind of question" was what she told me.    I explained that we have to know her wished in case something happens. At times, I had difficulty communicating with the patient, it appears to be related to her hearing.

## 2019-05-08 NOTE — Progress Notes (Signed)
Daily Progress Note   Patient Name: Joanne Burke       Date: 05/08/2019 DOB: 1930/08/26  Age: 85 y.o. MRN#: [id number] Attending Physician: Glade Lloyd, MD Primary Care Physician: Marden Noble, MD Admit Date: 04/25/2019  Reason for Consultation/Follow-up: Establishing goals of care  Subjective: Patient sitting up in chair. She is alert and oriented. Eating well. Finished her whole bowl of tomato soup while I sat with her.  She recounted to me that she had an upsetting discussion with MD this morning regarding code status. Stated she did not wish to discuss it further.  GOC remain to continue aggressive medical care, note referral for CIR.  She c/o pain in abdomen, but tells me it is "getting better every day". She continues to work well with PT. RN states patient was difficult to arouse this morning.   ROS  Length of Stay: 13  Current Medications: Scheduled Meds:  . acetaminophen  1,000 mg Oral Q8H  . Chlorhexidine Gluconate Cloth  6 each Topical Daily  . feeding supplement (ENSURE ENLIVE)  237 mL Oral TID BM  . methocarbamol  500 mg Oral TID  . multivitamin with minerals  1 tablet Oral Daily  . saccharomyces boulardii  250 mg Oral BID    Continuous Infusions: . sodium chloride 500 mL (05/07/19 0650)  . feeding supplement (VITAL AF 1.2 CAL) Stopped (05/08/19 0954)    PRN Meds: sodium chloride, dextrose, diphenhydrAMINE, ipratropium-albuterol, lip balm, LORazepam, morphine injection, traMADol  Physical Exam          Vital Signs: BP 140/67 (BP Location: Right Arm)   Pulse 85   Temp 98.3 F (36.8 C) (Oral)   Resp 16   Ht 5\' 2"  (1.575 m)   Wt 84.1 kg   SpO2 98%   BMI 33.91 kg/m  SpO2: SpO2: 98 % O2 Device: O2 Device: Room Air O2 Flow Rate: O2 Flow Rate (L/min):  2 L/min  Intake/output summary:   Intake/Output Summary (Last 24 hours) at 05/08/2019 1347 Last data filed at 05/08/2019 1300 Gross per 24 hour  Intake 1737 ml  Output 955 ml  Net 782 ml   LBM: Last BM Date: (stool in ostomy pouch now) Baseline Weight: Weight: 40.8 kg Most recent weight: Weight: 84.1 kg       Palliative Assessment/Data: PPS: 60%  Patient Active Problem List   Diagnosis Date Noted  . Goals of care, counseling/discussion   . Palliative care by specialist   . Advanced care planning/counseling discussion   . Diverticulitis of colon with perforation 04/25/2019  . Sepsis ([place]) 04/25/2019    Palliative Care Assessment & Plan   Patient Profile: 85 y.o. female  with past medical history of good health- no problems admitted on 04/25/2019 with abdominal pain. Found to pelvic abscesses likely from diverticulitis, sepsis. Underwent surgical resection with placement of colostomy and g-tube. Palliative medicine consulted for [place].  Assessment/Recommendations/Plan   Continue current care  PMT will continue to follow- will hold off on further discussion regarding code status for now- will intervene if patient declines or fails to improve  Reduced trazadone to 25mg  due to RN stated patient difficult to arouse this AM- also d/c'd lorazapam and diphenhydramine- would avoid any sedating medications  Encouraged patient to utilize available pain medications   Goals of Care and Additional Recommendations:  Limitations on Scope of Treatment: Full Scope Treatment  Code Status:  Full code  Prognosis:   Unable to determine  Discharge Planning:  To Be Determined- patient hoping for inpatient rehab and home  Care plan was discussed with patient and RN  Thank you for allowing the Palliative Medicine Team to assist in the care of this patient.   Time In: 1215 Time Out: 1250 Total Time 35 min Prolonged Time Billed no      Greater than 50%  of this time was  spent counseling and coordinating care related to the above assessment and plan.  Mariana Kaufman, AGNP-C Palliative Medicine   Please contact Palliative Medicine Team phone at [phone] for questions and concerns.

## 2019-05-08 NOTE — Progress Notes (Signed)
Patient ID: Joanne Burke, female   DOB: 12/03/1930, 85 y.o.   MRN: [id number]  PROGRESS NOTE    Joanne Burke  MVH:[id number] DOB: 12/03/1930 DOA: 04/25/2019 PCP: Marden NobleGates, Robert, MD   Brief Narrative:  85 year old female with no significant past medical history presented with abdominal pain.  She was found to have CT evidence of possible diverticulitis, abscess versus fluid in the abdomen along with acute kidney injury, lactic acidosis and leukocytosis.  Surgery was consulted.  Nephrology was also consulted for acute kidney injury.  She underwent exploratory laparotomy with sigmoid colectomy, colostomy and gastrostomy tube placement with mobilization of splenic flexure.  She was treated with intravenous antibiotics.  Assessment & Plan:   Sigmoid diverticulitis with multiple pelvic abscesses -Status post exploratory laparotomy with sigmoid colectomy, colostomy and gastrostomy tube placement on 04/28/2019 -General surgery following.  Wound care as per general surgery recommendations.  Continue G-tube feeding. -Zosyn stopped on 05/07/2019 by general surgery.  Leukocytosis -White cell count elevated but improving, 17.5 today.  Monitor.   Anemia of chronic disease -Hemoglobin 7.2 today.  Transfuse if hemoglobin is less than 7.  AKI Non-anion gap metabolic acidosis -Resolved.  Nephrology signed off.  Successfully passed voiding trial  Hypokalemia -Improved  Hypomagnesemia -Improved.  Acute respiratory failure with hypoxia -Resolved. -Chest x-ray showed patchy bilateral airspace opacities concerning for pneumonia on 04/27/2019, could be aspiration pneumonia or pulmonary edema.  She is currently on Zosyn. -Currently on room air.  Generalized conditioning -PT recommends SNF placement.  Family not interested in SNF.  PT now recommending CIR.  CIR consulted.   -Palliative Care consulted for goals of care discussion.  Patient remains full code.  Acute metabolic encephalopathy, likely  multifactorial -Most likely secondary to narcotic medication induced in the setting of recent abdominal surgery along with pain/hyponatremia/AKI.  Had received multiple doses of Ativan for agitation -Mental status is much improved.  Monitor.  Avoid sedative medications as much as possible.   DVT prophylaxis: SCDs Code Status: Full Family Communication: None at bedside Disposition Plan: CIR versus home with home health once cleared by general surgery. Consultants: General surgery/nephrology/palliative care  Procedures:  exploratory laparotomy with sigmoid colectomy, colostomy and gastrostomy tube placement on 04/28/2019  Antimicrobials:  Anti-infectives (From admission, onward)   Start     Dose/Rate Route Frequency Ordered Stop   05/04/19 1400  piperacillin-tazobactam (ZOSYN) IVPB 3.375 g  Status:  Discontinued     3.375 g 12.5 mL/hr over 240 Minutes Intravenous Every 8 hours 05/04/19 1304 05/07/19 1043   04/29/19 1400  piperacillin-tazobactam (ZOSYN) IVPB 2.25 g  Status:  Discontinued     2.25 g 100 mL/hr over 30 Minutes Intravenous Every 8 hours 04/29/19 1101 05/04/19 1304   04/27/19 1000  cefTRIAXone (ROCEPHIN) 2 g in sodium chloride 0.9 % 100 mL IVPB  Status:  Discontinued     2 g 200 mL/hr over 30 Minutes Intravenous Every 24 hours 04/27/19 0859 04/29/19 1101   04/27/19 1000  metroNIDAZOLE (FLAGYL) IVPB 500 mg  Status:  Discontinued     500 mg 100 mL/hr over 60 Minutes Intravenous Every 8 hours 04/27/19 0859 04/29/19 1101   04/26/19 2200  piperacillin-tazobactam (ZOSYN) IVPB 3.375 g  Status:  Discontinued     3.375 g 12.5 mL/hr over 240 Minutes Intravenous Every 12 hours 04/26/19 1253 04/27/19 0858   04/26/19 1800  aztreonam (AZACTAM) 1 g in sodium chloride 0.9 % 100 mL IVPB  Status:  Discontinued     1 g 200 mL/hr  over 30 Minutes Intravenous Every 12 hours 04/26/19 0805 04/26/19 1253   04/25/19 2030  ciprofloxacin (CIPRO) IVPB 200 mg  Status:  Discontinued     200 mg 100  mL/hr over 60 Minutes Intravenous Every 12 hours 04/25/19 1109 04/25/19 1640   04/25/19 2030  aztreonam (AZACTAM) 1 g in sodium chloride 0.9 % 100 mL IVPB  Status:  Discontinued     1 g 200 mL/hr over 30 Minutes Intravenous Every 8 hours 04/25/19 1651 04/26/19 0805   04/25/19 1730  aztreonam (AZACTAM) 2 g in sodium chloride 0.9 % 100 mL IVPB  Status:  Discontinued     2 g 200 mL/hr over 30 Minutes Intravenous  Once 04/25/19 1640 04/25/19 1651   04/25/19 1600  metroNIDAZOLE (FLAGYL) IVPB 500 mg  Status:  Discontinued     500 mg 100 mL/hr over 60 Minutes Intravenous Every 8 hours 04/25/19 1109 04/26/19 1253   04/25/19 0730  ciprofloxacin (CIPRO) IVPB 400 mg     400 mg 200 mL/hr over 60 Minutes Intravenous  Once 04/25/19 0729 04/25/19 0921   04/25/19 0730  metroNIDAZOLE (FLAGYL) IVPB 500 mg     500 mg 100 mL/hr over 60 Minutes Intravenous  Once 04/25/19 0729 04/25/19 4696        Subjective: Patient seen and examined at bedside.  Poor historian.  Denies worsening abdominal pain, fever or vomiting. Objective: Vitals:   05/07/19 1222 05/07/19 2201 05/08/19 0427 05/08/19 0428  BP: (!) 103/55 136/62 140/67   Pulse: 84 80 85   Resp: 16     Temp: 98.8 F (37.1 C) 98.6 F (37 C) 98.3 F (36.8 C)   TempSrc: Oral Oral Oral   SpO2: 98% 97% 98%   Weight:    84.1 kg  Height:        Intake/Output Summary (Last 24 hours) at 05/08/2019 0813 Last data filed at 05/08/2019 0557 Gross per 24 hour  Intake 1645.91 ml  Output 995 ml  Net 650.91 ml   Filed Weights   05/06/19 0458 05/07/19 0439 05/08/19 0428  Weight: 85.7 kg 86.5 kg 84.1 kg    Examination:  General exam: No acute distress.  Elderly female lying in bed.  Poor historian.  Flat affect.   Respiratory system: Bilateral decreased breath sounds at bases, no wheezing.  Some crackles.   Cardiovascular system: Rate controlled, scattered Gastrointestinal system: Abdomen is nondistended, soft and has dressing present. Normal bowel  sounds heard.  Colostomy present Extremities: No cyanosis, edema    Data Reviewed: I have personally reviewed following labs and imaging studies  CBC: Recent Labs  Lab 05/04/19 0210 05/05/19 0233 05/06/19 0223 05/07/19 0312 05/08/19 0155  WBC 35.0* 36.8* 28.8* 23.8* 17.5*  NEUTROABS  --   --  25.6* 21.1* 15.3*  HGB 8.8* 8.5* 7.9* 7.8* 7.2*  HCT 25.8* 25.0* 22.6* 22.6* 22.5*  MCV 89.0 90.3 89.7 91.9 94.1  PLT 435* 538* 584* 672* 295*   Basic Metabolic Panel: Recent Labs  Lab 05/02/19 0805  05/04/19 0210 05/05/19 0233 05/06/19 0223 05/07/19 0312 05/08/19 0155  NA 139   < > 134* 135 138 137 138  K 3.2*   < > 3.8 3.0* 3.2* 4.0 3.7  CL 108   < > 105 100 102 102 104  CO2 21*   < > 21* 23 26 25 25   GLUCOSE 173*   < > 85 107* 89 117* 119*  BUN 33*   < > 19 15 11 12 14   CREATININE  0.95   < > 0.62 0.62 0.56 0.61 0.55  CALCIUM 7.3*   < > 7.2* 7.4* 7.7* 8.0* 7.8*  MG 1.8  --   --  1.5* 1.7 1.6* 2.0  PHOS 2.0*   < > 3.1 2.7 2.5 2.4* 2.7   < > = values in this interval not displayed.   GFR: Estimated Creatinine Clearance: 50.8 mL/min (by C-G formula based on SCr of 0.55 mg/dL). Liver Function Tests: Recent Labs  Lab 05/02/19 0805 05/03/19 0348 05/04/19 0210  ALBUMIN 1.6* 1.6* 1.6*   No results for input(s): LIPASE, AMYLASE in the last 168 hours. No results for input(s): AMMONIA in the last 168 hours. Coagulation Profile: No results for input(s): INR, PROTIME in the last 168 hours. Cardiac Enzymes: No results for input(s): CKTOTAL, CKMB, CKMBINDEX, TROPONINI in the last 168 hours. BNP (last 3 results) No results for input(s): PROBNP in the last 8760 hours. HbA1C: No results for input(s): HGBA1C in the last 72 hours. CBG: Recent Labs  Lab 05/07/19 0815 05/07/19 1221 05/07/19 1631 05/07/19 2030 05/08/19 0422  GLUCAP 112* 136* 140* 81 115*   Lipid Profile: No results for input(s): CHOL, HDL, LDLCALC, TRIG, CHOLHDL, LDLDIRECT in the last 72 hours. Thyroid  Function Tests: No results for input(s): TSH, T4TOTAL, FREET4, T3FREE, THYROIDAB in the last 72 hours. Anemia Panel: No results for input(s): VITAMINB12, FOLATE, FERRITIN, TIBC, IRON, RETICCTPCT in the last 72 hours. Sepsis Labs: No results for input(s): PROCALCITON, LATICACIDVEN in the last 168 hours.  No results found for this or any previous visit (from the past 240 hour(s)).       Radiology Studies: No results found.      Scheduled Meds: . acetaminophen  1,000 mg Oral Q8H  . Chlorhexidine Gluconate Cloth  6 each Topical Daily  . feeding supplement (ENSURE ENLIVE)  237 mL Oral TID BM  . methocarbamol  500 mg Oral TID  . multivitamin with minerals  1 tablet Oral Daily  . saccharomyces boulardii  250 mg Oral BID   Continuous Infusions: . sodium chloride 500 mL (05/07/19 0650)  . feeding supplement (VITAL AF 1.2 CAL) 1,000 mL (05/07/19 2017)          Glade Lloyd, MD [facility] 05/08/2019, 8:13 AM

## 2019-05-08 NOTE — Progress Notes (Signed)
10 Days Post-Op    CC: Recurrent/intolerable left lower quadrant abdominal pain  Subjective: Says she did not sleep well last night but is blaming on the bed today.  She is tolerating p.o.'s well.  We switch her over to night feedings for her tube feeding.  Midline site, ostomy both look good.  Ostomy is working well.  Objective: Vital signs in last 24 hours: Temp:  [98.3 F (36.8 C)-98.8 F (37.1 C)] 98.3 F (36.8 C) (10/10 0427) Pulse Rate:  [80-85] 85 (10/10 0427) Resp:  [16] 16 (10/09 1222) BP: (103-140)/(55-67) 140/67 (10/10 0427) SpO2:  [97 %-98 %] 98 % (10/10 0427) Weight:  [84.1 kg] 84.1 kg (10/10 0428) Last BM Date: (colostomy) Age 85 p.o. recorded yesterday 580 per feeding tube 225 IV 805 urine 50 stool recorded Afebrile vital signs are stable BMP is stable. WBC continues to improve Intake/Output from previous day: 10/09 0701 - 10/10 0700 In: 1645.9 [P.O.:840; I.V.:175.9; NG/GT:580; IV Piggyback:50] Out: 995 [Urine:805; Drains:140; Stool:50] Intake/Output this shift: No intake/output data recorded.  General appearance: alert, cooperative and no distress Resp: clear to auscultation bilaterally GI: Soft, ostomy is working well, ostomy looks good.  Midline incision clean wet-to-dry dressings.  No issues with tube feedings.  Lab Results:  Recent Labs    05/07/19 0312 05/08/19 0155  WBC 23.8* 17.5*  HGB 7.8* 7.2*  HCT 22.6* 22.5*  PLT 672* 733*    BMET Recent Labs    05/07/19 0312 05/08/19 0155  NA 137 138  K 4.0 3.7  CL 102 104  CO2 25 25  GLUCOSE 117* 119*  BUN 12 14  CREATININE 0.61 0.55  CALCIUM 8.0* 7.8*   PT/INR No results for input(s): LABPROT, INR in the last 72 hours.  Recent Labs  Lab 05/02/19 0805 05/03/19 0348 05/04/19 0210  ALBUMIN 1.6* 1.6* 1.6*     Lipase     Component Value Date/Time   LIPASE 15 04/25/2019 0355     Medications: . acetaminophen  1,000 mg Oral Q8H  . Chlorhexidine Gluconate Cloth  6 each Topical  Daily  . feeding supplement (ENSURE ENLIVE)  237 mL Oral TID BM  . methocarbamol  500 mg Oral TID  . multivitamin with minerals  1 tablet Oral Daily  . saccharomyces boulardii  250 mg Oral BID    Assessment/Plan Sepsis Encephalopathy AKI-Cr2.08(10/1)>>0.75(10/5)>>renal is signing off. ABL anemia -Received 2 pRBC and 1 FFP9/30,. Black tarry stool10/4;H/H 9.4/26.8(10/1) >>9.8/27.8(10/5) Malnutrition-prealbumin20.9 HxConstipation Tobacco abuse   Severe sigmoid diverticulitis with multiple pelvic abscesses; largest 11.4 x 2.4 x 2.1 Exploratory laparotomywith sigmoid colectomy, colostomy, and Stamm gastrostomy tube placement, Dr. Brantley Stage 9/30 for expected severe diverticulitis with multiple pelvic abscesses POD#10, -Pathology with diverticulitis -Stoma viable - BID wet to dry dressing changes. Continue abdominal binder to protect G tube - monitor JP drainage -. Add IV ativan PRN anxiety - Leukocytosis  -  23.0(10/1)>>45.8(10/3)>>49.5(10/4)>>40.4(10/5)>>35.0(10/6)>>23.8(10/9)     23.8(10/9)>>17.5(10/10) ID -cipro9/27 x1,flagyl9/27>>10/1, aztreonam 9/27>>9/28, zosyn 9/28>>9/29, rocephin 9/29>>10/1; zosyn10/1-10/9 VTE -SCDs,  FEN -regular diet/tube feeding -cyclic in p.m. Foley -foley  Follow up -Dr. Brantley Stage Foley: out  Plan: Patient is being evaluated for CIR; continue therapies and hopefully transfer to CIR.   LOS: 13 days    Antonious Omahoney 05/08/2019 [phone]

## 2019-05-09 DIAGNOSIS — N179 Acute kidney failure, unspecified: Secondary | ICD-10-CM

## 2019-05-09 DIAGNOSIS — D638 Anemia in other chronic diseases classified elsewhere: Secondary | ICD-10-CM

## 2019-05-09 LAB — CBC WITH DIFFERENTIAL/PLATELET
Abs Immature Granulocytes: 0.21 10*3/uL — ABNORMAL HIGH (ref 0.00–0.07)
Basophils Absolute: 0.1 10*3/uL (ref 0.0–0.1)
Basophils Relative: 0 %
Eosinophils Absolute: 0.2 10*3/uL (ref 0.0–0.5)
Eosinophils Relative: 1 %
HCT: 22.7 % — ABNORMAL LOW (ref 36.0–46.0)
Hemoglobin: 7.4 g/dL — ABNORMAL LOW (ref 12.0–15.0)
Immature Granulocytes: 1 %
Lymphocytes Relative: 6 %
Lymphs Abs: 1 10*3/uL (ref 0.7–4.0)
MCH: 30.5 pg (ref 26.0–34.0)
MCHC: 32.6 g/dL (ref 30.0–36.0)
MCV: 93.4 fL (ref 80.0–100.0)
Monocytes Absolute: 1 10*3/uL (ref 0.1–1.0)
Monocytes Relative: 6 %
Neutro Abs: 15.1 10*3/uL — ABNORMAL HIGH (ref 1.7–7.7)
Neutrophils Relative %: 86 %
Platelets: 709 10*3/uL — ABNORMAL HIGH (ref 150–400)
RBC: 2.43 MIL/uL — ABNORMAL LOW (ref 3.87–5.11)
RDW: 19.2 % — ABNORMAL HIGH (ref 11.5–15.5)
WBC: 17.5 10*3/uL — ABNORMAL HIGH (ref 4.0–10.5)
nRBC: 0 % (ref 0.0–0.2)

## 2019-05-09 LAB — GLUCOSE, CAPILLARY
Glucose-Capillary: 103 mg/dL — ABNORMAL HIGH (ref 70–99)
Glucose-Capillary: 111 mg/dL — ABNORMAL HIGH (ref 70–99)
Glucose-Capillary: 114 mg/dL — ABNORMAL HIGH (ref 70–99)
Glucose-Capillary: 141 mg/dL — ABNORMAL HIGH (ref 70–99)
Glucose-Capillary: 87 mg/dL (ref 70–99)
Glucose-Capillary: 97 mg/dL (ref 70–99)

## 2019-05-09 LAB — BASIC METABOLIC PANEL
Anion gap: 7 (ref 5–15)
BUN: 13 mg/dL (ref 8–23)
CO2: 27 mmol/L (ref 22–32)
Calcium: 8.1 mg/dL — ABNORMAL LOW (ref 8.9–10.3)
Chloride: 103 mmol/L (ref 98–111)
Creatinine, Ser: 0.52 mg/dL (ref 0.44–1.00)
GFR calc Af Amer: >60 mL/min (ref >60)
GFR calc non Af Amer: >60 mL/min (ref >60)
Glucose, Bld: 108 mg/dL — ABNORMAL HIGH (ref 70–99)
Potassium: 4 mmol/L (ref 3.5–5.1)
Sodium: 137 mmol/L (ref 135–145)

## 2019-05-09 LAB — MAGNESIUM: Magnesium: 1.9 mg/dL (ref 1.7–2.4)

## 2019-05-09 MED ORDER — VITAL AF 1.2 CAL PO LIQD
1000.0000 mL | ORAL | Status: DC
  Administered 2019-05-10 – 2019-05-15 (×6): 1000 mL
  Filled 2019-05-09 (×13): qty 1000

## 2019-05-09 MED ORDER — ALUM & MAG HYDROXIDE-SIMETH 200-200-20 MG/5ML PO SUSP
15.0000 mL | Freq: Four times a day (QID) | ORAL | Status: DC | PRN
  Administered 2019-05-15: 15 mL via ORAL
  Filled 2019-05-09: qty 30

## 2019-05-09 MED ORDER — PANTOPRAZOLE SODIUM 40 MG PO TBEC
40.0000 mg | DELAYED_RELEASE_TABLET | Freq: Every day | ORAL | Status: DC
  Administered 2019-05-09 – 2019-05-16 (×8): 40 mg via ORAL
  Filled 2019-05-09 (×8): qty 1

## 2019-05-09 NOTE — Progress Notes (Addendum)
Secure chatted Dr. Starla Link:  Joanne Burke's stomach is hurting her more today. she refuses pain medicine (only took a tramadol this morning and just took tylenol) she said she feels pressure, like nothing is going down and she's going to pop. BS normal/hyper, abdomen soft, ostomy is making soft poop.      I will let surgery know the same.  Paged general surgery with above information.  VS WNL,

## 2019-05-09 NOTE — Progress Notes (Signed)
Patient ID: Joanne Burke, female   DOB: 22-Jul-1931, 85 y.o.   MRN: [id number] [facility] Progress Note:   11 Days Post-Op  Subjective: Mental status is clear Objective: Vital signs in last 24 hours: Temp:  [98.2 F (36.8 C)-98.4 F (36.9 C)] 98.2 F (36.8 C) (10/11 0425) Pulse Rate:  [75-86] 77 (10/11 0425) Resp:  [14-18] 18 (10/11 0425) BP: (111-151)/(59-76) 111/59 (10/11 0425) SpO2:  [94 %-97 %] 94 % (10/11 0425) Weight:  [84.6 kg] 84.6 kg (10/11 0425)  Intake/Output from previous day: 10/10 0701 - 10/11 0700 In: 997 [P.O.:760; NG/GT:237] Out: 678 [Urine:275; Drains:3; Stool:400] Intake/Output this shift: Total I/O In: 792 [P.O.:120; NG/GT:672] Out: 200 [Urine:200]  Physical Exam: Work of breathing is normal;  Ostomy functiong; eating OK  Lab Results:  Results for orders placed or performed during the hospital encounter of 04/25/19 (from the past 48 hour(s))  Glucose, capillary     Status: Abnormal   Collection Time: 05/07/19 12:21 PM  Result Value Ref Range   Glucose-Capillary 136 (H) 70 - 99 mg/dL  Glucose, capillary     Status: Abnormal   Collection Time: 05/07/19  4:31 PM  Result Value Ref Range   Glucose-Capillary 140 (H) 70 - 99 mg/dL  Glucose, capillary     Status: None   Collection Time: 05/07/19  8:30 PM  Result Value Ref Range   Glucose-Capillary 81 70 - 99 mg/dL  Basic metabolic panel     Status: Abnormal   Collection Time: 05/08/19  1:55 AM  Result Value Ref Range   Sodium 138 135 - 145 mmol/L   Potassium 3.7 3.5 - 5.1 mmol/L   Chloride 104 98 - 111 mmol/L   CO2 25 22 - 32 mmol/L   Glucose, Bld 119 (H) 70 - 99 mg/dL   BUN 14 8 - 23 mg/dL   Creatinine, Ser 4.09 0.44 - 1.00 mg/dL   Calcium 7.8 (L) 8.9 - 10.3 mg/dL   GFR calc non Af Amer >60 >60 mL/min   GFR calc Af Amer >60 >60 mL/min   Anion gap 9 5 - 15    Comment: Performed at [facility], [address] [place], [place] [address]  Phosphorus     Status: None   Collection  Time: 05/08/19  1:55 AM  Result Value Ref Range   Phosphorus 2.7 2.5 - 4.6 mg/dL    Comment: Performed at [facility], [address], [place], [place] [address]  Magnesium     Status: None   Collection Time: 05/08/19  1:55 AM  Result Value Ref Range   Magnesium 2.0 1.7 - 2.4 mg/dL    Comment: Performed at [facility], [address], [place], [place] [address]  CBC with Differential/Platelet     Status: Abnormal   Collection Time: 05/08/19  1:55 AM  Result Value Ref Range   WBC 17.5 (H) 4.0 - 10.5 K/uL   RBC 2.39 (L) 3.87 - 5.11 MIL/uL   Hemoglobin 7.2 (L) 12.0 - 15.0 g/dL   HCT 08.6 (L) 57.8 - 46.9 %   MCV 94.1 80.0 - 100.0 fL   MCH 30.1 26.0 - 34.0 pg   MCHC 32.0 30.0 - 36.0 g/dL   RDW 62.9 (H) 52.8 - 41.3 %   Platelets 733 (H) 150 - 400 K/uL   nRBC 0.0 0.0 - 0.2 %   Neutrophils Relative % 87 %   Neutro Abs 15.3 (H) 1.7 - 7.7 K/uL  Lymphocytes Relative 5 %   Lymphs Abs 0.9 0.7 - 4.0 K/uL   Monocytes Relative 6 %   Monocytes Absolute 1.0 0.1 - 1.0 K/uL   Eosinophils Relative 1 %   Eosinophils Absolute 0.2 0.0 - 0.5 K/uL   Basophils Relative 0 %   Basophils Absolute 0.0 0.0 - 0.1 K/uL   Immature Granulocytes 1 %   Abs Immature Granulocytes 0.19 (H) 0.00 - 0.07 K/uL    Comment: Performed at [facility], [address] [place], [place] [address]  Glucose, capillary     Status: Abnormal   Collection Time: 05/08/19  4:22 AM  Result Value Ref Range   Glucose-Capillary 115 (H) 70 - 99 mg/dL   Comment 1 Repeat Test   Glucose, capillary     Status: Abnormal   Collection Time: 05/08/19  8:29 AM  Result Value Ref Range   Glucose-Capillary 127 (H) 70 - 99 mg/dL  Glucose, capillary     Status: Abnormal   Collection Time: 05/08/19 12:12 PM  Result Value Ref Range   Glucose-Capillary 110 (H) 70 - 99 mg/dL  Glucose, capillary     Status: None   Collection Time: 05/08/19  3:38 PM  Result Value Ref Range   Glucose-Capillary 99 70 - 99 mg/dL  Glucose,  capillary     Status: Abnormal   Collection Time: 05/08/19  8:44 PM  Result Value Ref Range   Glucose-Capillary 101 (H) 70 - 99 mg/dL  Glucose, capillary     Status: Abnormal   Collection Time: 05/09/19 12:20 AM  Result Value Ref Range   Glucose-Capillary 114 (H) 70 - 99 mg/dL  CBC with Differential/Platelet     Status: Abnormal   Collection Time: 05/09/19  3:14 AM  Result Value Ref Range   WBC 17.5 (H) 4.0 - 10.5 K/uL   RBC 2.43 (L) 3.87 - 5.11 MIL/uL   Hemoglobin 7.4 (L) 12.0 - 15.0 g/dL   HCT 60.422.7 (L) 54.036.0 - 98.146.0 %   MCV 93.4 80.0 - 100.0 fL   MCH 30.5 26.0 - 34.0 pg   MCHC 32.6 30.0 - 36.0 g/dL   RDW 19.119.2 (H) 47.811.5 - 29.515.5 %   Platelets 709 (H) 150 - 400 K/uL   nRBC 0.0 0.0 - 0.2 %   Neutrophils Relative % 86 %   Neutro Abs 15.1 (H) 1.7 - 7.7 K/uL   Lymphocytes Relative 6 %   Lymphs Abs 1.0 0.7 - 4.0 K/uL   Monocytes Relative 6 %   Monocytes Absolute 1.0 0.1 - 1.0 K/uL   Eosinophils Relative 1 %   Eosinophils Absolute 0.2 0.0 - 0.5 K/uL   Basophils Relative 0 %   Basophils Absolute 0.1 0.0 - 0.1 K/uL   Immature Granulocytes 1 %   Abs Immature Granulocytes 0.21 (H) 0.00 - 0.07 K/uL    Comment: Performed at [facility], [address] [place], [place] [address]  Basic metabolic panel     Status: Abnormal   Collection Time: 05/09/19  3:14 AM  Result Value Ref Range   Sodium 137 135 - 145 mmol/L   Potassium 4.0 3.5 - 5.1 mmol/L   Chloride 103 98 - 111 mmol/L   CO2 27 22 - 32 mmol/L   Glucose, Bld 108 (H) 70 - 99 mg/dL   BUN 13 8 - 23 mg/dL   Creatinine, Ser 8.650.52 0.44 - 1.00 mg/dL   Calcium 8.1 (L) 8.9 - 10.3 mg/dL   GFR calc non Af  Amer >60 >60 mL/min   GFR calc Af Amer >60 >60 mL/min   Anion gap 7 5 - 15    Comment: Performed at [facility], [address] [place], [place] [address]  Magnesium     Status: None   Collection Time: 05/09/19  3:14 AM  Result Value Ref Range   Magnesium 1.9 1.7 - 2.4 mg/dL    Comment: Performed at [facility]  Lab, [address], [place], [place] [address]  Glucose, capillary     Status: Abnormal   Collection Time: 05/09/19  4:23 AM  Result Value Ref Range   Glucose-Capillary 111 (H) 70 - 99 mg/dL  Glucose, capillary     Status: Abnormal   Collection Time: 05/09/19  7:50 AM  Result Value Ref Range   Glucose-Capillary 103 (H) 70 - 99 mg/dL    Radiology/Results: No results found.  Anti-infectives: Anti-infectives (From admission, onward)   Start     Dose/Rate Route Frequency Ordered Stop   05/04/19 1400  piperacillin-tazobactam (ZOSYN) IVPB 3.375 g  Status:  Discontinued     3.375 g 12.5 mL/hr over 240 Minutes Intravenous Every 8 hours 05/04/19 1304 05/07/19 1043   04/29/19 1400  piperacillin-tazobactam (ZOSYN) IVPB 2.25 g  Status:  Discontinued     2.25 g 100 mL/hr over 30 Minutes Intravenous Every 8 hours 04/29/19 1101 05/04/19 1304   04/27/19 1000  cefTRIAXone (ROCEPHIN) 2 g in sodium chloride 0.9 % 100 mL IVPB  Status:  Discontinued     2 g 200 mL/hr over 30 Minutes Intravenous Every 24 hours 04/27/19 0859 04/29/19 1101   04/27/19 1000  metroNIDAZOLE (FLAGYL) IVPB 500 mg  Status:  Discontinued     500 mg 100 mL/hr over 60 Minutes Intravenous Every 8 hours 04/27/19 0859 04/29/19 1101   04/26/19 2200  piperacillin-tazobactam (ZOSYN) IVPB 3.375 g  Status:  Discontinued     3.375 g 12.5 mL/hr over 240 Minutes Intravenous Every 12 hours 04/26/19 1253 04/27/19 0858   04/26/19 1800  aztreonam (AZACTAM) 1 g in sodium chloride 0.9 % 100 mL IVPB  Status:  Discontinued     1 g 200 mL/hr over 30 Minutes Intravenous Every 12 hours 04/26/19 0805 04/26/19 1253   04/25/19 2030  ciprofloxacin (CIPRO) IVPB 200 mg  Status:  Discontinued     200 mg 100 mL/hr over 60 Minutes Intravenous Every 12 hours 04/25/19 1109 04/25/19 1640   04/25/19 2030  aztreonam (AZACTAM) 1 g in sodium chloride 0.9 % 100 mL IVPB  Status:  Discontinued     1 g 200 mL/hr over 30 Minutes Intravenous Every 8 hours 04/25/19 1651  04/26/19 0805   04/25/19 1730  aztreonam (AZACTAM) 2 g in sodium chloride 0.9 % 100 mL IVPB  Status:  Discontinued     2 g 200 mL/hr over 30 Minutes Intravenous  Once 04/25/19 1640 04/25/19 1651   04/25/19 1600  metroNIDAZOLE (FLAGYL) IVPB 500 mg  Status:  Discontinued     500 mg 100 mL/hr over 60 Minutes Intravenous Every 8 hours 04/25/19 1109 04/26/19 1253   04/25/19 0730  ciprofloxacin (CIPRO) IVPB 400 mg     400 mg 200 mL/hr over 60 Minutes Intravenous  Once 04/25/19 0729 04/25/19 0921   04/25/19 0730  metroNIDAZOLE (FLAGYL) IVPB 500 mg     500 mg 100 mL/hr over 60 Minutes Intravenous  Once 04/25/19 0729 04/25/19 1093      Assessment/Plan: Problem List: Patient Active Problem List  Diagnosis Date Noted  . Goals of care, counseling/discussion   . Palliative care by specialist   . Advanced care planning/counseling discussion   . Diverticulitis of colon with perforation 04/25/2019  . Sepsis ([place]) 04/25/2019    Doing better;  stable 11 Days Post-Op    LOS: 14 days   Matt B. Hassell Done, MD, [facility] [phone] beeper [phone]  05/09/2019 11:08 AM

## 2019-05-09 NOTE — Progress Notes (Signed)
At 0510 while assisting patient to [facility] commode, patient stated she slept very well, and was best she had slept in a while.  Dressing change 0630, moderate serosangineous drainage.   Ostomy dark brownish green output. No emptied; less than 1/3 full.   Approx 36ml in JP drain, serosang with think serous in tubing

## 2019-05-09 NOTE — Progress Notes (Signed)
Patient ID: Joanne Burke, female   DOB: Oct 06, 1930, 85 y.o.   MRN: [id number]  PROGRESS NOTE    Joanne Burke  GEX:[id number] DOB: [birth year] DOA: 04/25/2019 PCP: Josetta Huddle, MD   Brief Narrative:  85 year old female with no significant past medical history presented with abdominal pain.  She was found to have CT evidence of possible diverticulitis, abscess versus fluid in the abdomen along with acute kidney injury, lactic acidosis and leukocytosis.  Surgery was consulted.  Nephrology was also consulted for acute kidney injury.  She underwent exploratory laparotomy with sigmoid colectomy, colostomy and gastrostomy tube placement with mobilization of splenic flexure.  She was treated with intravenous antibiotics.  Assessment & Plan:   Sigmoid diverticulitis with multiple pelvic abscesses -Status post exploratory laparotomy with sigmoid colectomy, colostomy and gastrostomy tube placement on 04/28/2019 -General surgery following.  Wound care as per general surgery recommendations.  Continue G-tube feeding. -Zosyn stopped on 05/07/2019 by general surgery.  Leukocytosis -White cell count elevated but improving, 17.5 today as well.  Monitor.   Anemia of chronic disease -Hemoglobin 7.4 today.  Transfuse if hemoglobin is less than 7.  AKI Non-anion gap metabolic acidosis -Resolved.  Nephrology signed off.  Successfully passed voiding trial  Hypokalemia -Improved  Hypomagnesemia -Improved.  Acute respiratory failure with hypoxia -Resolved. -Chest x-ray showed patchy bilateral airspace opacities concerning for pneumonia on 04/27/2019, could be aspiration pneumonia or pulmonary edema.  She is currently on Zosyn. -Currently on room air.  Generalized conditioning -PT recommends SNF placement.  Family not interested in SNF.  PT now recommending CIR.  CIR consulted.   -Palliative Care consulted for goals of care discussion.  Patient remains full code.  Acute metabolic encephalopathy,  likely multifactorial -Most likely secondary to narcotic medication induced in the setting of recent abdominal surgery along with pain/hyponatremia/AKI.  Had received multiple doses of Ativan for agitation -Mental status is much improved.  Monitor.  Avoid sedative medications as much as possible.   DVT prophylaxis: SCDs Code Status: Full Family Communication: None at bedside Disposition Plan: CIR versus home with home health once cleared by general surgery. Consultants: General surgery/nephrology/palliative care  Procedures:  exploratory laparotomy with sigmoid colectomy, colostomy and gastrostomy tube placement on 04/28/2019  Antimicrobials:  Anti-infectives (From admission, onward)   Start     Dose/Rate Route Frequency Ordered Stop   05/04/19 1400  piperacillin-tazobactam (ZOSYN) IVPB 3.375 g  Status:  Discontinued     3.375 g 12.5 mL/hr over 240 Minutes Intravenous Every 8 hours 05/04/19 1304 05/07/19 1043   04/29/19 1400  piperacillin-tazobactam (ZOSYN) IVPB 2.25 g  Status:  Discontinued     2.25 g 100 mL/hr over 30 Minutes Intravenous Every 8 hours 04/29/19 1101 05/04/19 1304   04/27/19 1000  cefTRIAXone (ROCEPHIN) 2 g in sodium chloride 0.9 % 100 mL IVPB  Status:  Discontinued     2 g 200 mL/hr over 30 Minutes Intravenous Every 24 hours 04/27/19 0859 04/29/19 1101   04/27/19 1000  metroNIDAZOLE (FLAGYL) IVPB 500 mg  Status:  Discontinued     500 mg 100 mL/hr over 60 Minutes Intravenous Every 8 hours 04/27/19 0859 04/29/19 1101   04/26/19 2200  piperacillin-tazobactam (ZOSYN) IVPB 3.375 g  Status:  Discontinued     3.375 g 12.5 mL/hr over 240 Minutes Intravenous Every 12 hours 04/26/19 1253 04/27/19 0858   04/26/19 1800  aztreonam (AZACTAM) 1 g in sodium chloride 0.9 % 100 mL IVPB  Status:  Discontinued     1 g  200 mL/hr over 30 Minutes Intravenous Every 12 hours 04/26/19 0805 04/26/19 1253   04/25/19 2030  ciprofloxacin (CIPRO) IVPB 200 mg  Status:  Discontinued     200 mg  100 mL/hr over 60 Minutes Intravenous Every 12 hours 04/25/19 1109 04/25/19 1640   04/25/19 2030  aztreonam (AZACTAM) 1 g in sodium chloride 0.9 % 100 mL IVPB  Status:  Discontinued     1 g 200 mL/hr over 30 Minutes Intravenous Every 8 hours 04/25/19 1651 04/26/19 0805   04/25/19 1730  aztreonam (AZACTAM) 2 g in sodium chloride 0.9 % 100 mL IVPB  Status:  Discontinued     2 g 200 mL/hr over 30 Minutes Intravenous  Once 04/25/19 1640 04/25/19 1651   04/25/19 1600  metroNIDAZOLE (FLAGYL) IVPB 500 mg  Status:  Discontinued     500 mg 100 mL/hr over 60 Minutes Intravenous Every 8 hours 04/25/19 1109 04/26/19 1253   04/25/19 0730  ciprofloxacin (CIPRO) IVPB 400 mg     400 mg 200 mL/hr over 60 Minutes Intravenous  Once 04/25/19 0729 04/25/19 0921   04/25/19 0730  metroNIDAZOLE (FLAGYL) IVPB 500 mg     500 mg 100 mL/hr over 60 Minutes Intravenous  Once 04/25/19 0729 04/25/19 7425        Subjective: Patient seen and examined at bedside.  Very poor historian.  No overnight fever, vomiting, worsening abdominal pain reported.   Objective: Vitals:   05/08/19 0428 05/08/19 1834 05/08/19 2045 05/09/19 0425  BP:  131/70 (!) 151/76 (!) 111/59  Pulse:  75 86 77  Resp:  14 18 18   Temp:  98.4 F (36.9 C) 98.3 F (36.8 C) 98.2 F (36.8 C)  TempSrc:  Oral Oral Oral  SpO2:  97% 95% 94%  Weight: 84.1 kg   84.6 kg  Height:        Intake/Output Summary (Last 24 hours) at 05/09/2019 0815 Last data filed at 05/09/2019 0509 Gross per 24 hour  Intake 897 ml  Output 678 ml  Net 219 ml   Filed Weights   05/07/19 0439 05/08/19 0428 05/09/19 0425  Weight: 86.5 kg 84.1 kg 84.6 kg    Examination:  General exam: No distress.  Elderly female lying in bed.  Very poor historian.  Flat affect.   Respiratory system: Bilateral decreased breath sounds at bases, some scattered crackles  cardiovascular system: S1-S2 heard, rate controlled Gastrointestinal system: Abdomen is nondistended, soft and has  dressing present. Normal bowel sounds heard.  Colostomy present Extremities: No cyanosis, edema    Data Reviewed: I have personally reviewed following labs and imaging studies  CBC: Recent Labs  Lab 05/05/19 0233 05/06/19 0223 05/07/19 0312 05/08/19 0155 05/09/19 0314  WBC 36.8* 28.8* 23.8* 17.5* 17.5*  NEUTROABS  --  25.6* 21.1* 15.3* 15.1*  HGB 8.5* 7.9* 7.8* 7.2* 7.4*  HCT 25.0* 22.6* 22.6* 22.5* 22.7*  MCV 90.3 89.7 91.9 94.1 93.4  PLT 538* 584* 672* 733* 709*   Basic Metabolic Panel: Recent Labs  Lab 05/04/19 0210 05/05/19 0233 05/06/19 0223 05/07/19 0312 05/08/19 0155 05/09/19 0314  NA 134* 135 138 137 138 137  K 3.8 3.0* 3.2* 4.0 3.7 4.0  CL 105 100 102 102 104 103  CO2 21* 23 26 25 25 27   GLUCOSE 85 107* 89 117* 119* 108*  BUN 19 15 11 12 14 13   CREATININE 0.62 0.62 0.56 0.61 0.55 0.52  CALCIUM 7.2* 7.4* 7.7* 8.0* 7.8* 8.1*  MG  --  1.5* 1.7  1.6* 2.0 1.9  PHOS 3.1 2.7 2.5 2.4* 2.7  --    GFR: Estimated Creatinine Clearance: 50.9 mL/min (by C-G formula based on SCr of 0.52 mg/dL). Liver Function Tests: Recent Labs  Lab 05/03/19 0348 05/04/19 0210  ALBUMIN 1.6* 1.6*   No results for input(s): LIPASE, AMYLASE in the last 168 hours. No results for input(s): AMMONIA in the last 168 hours. Coagulation Profile: No results for input(s): INR, PROTIME in the last 168 hours. Cardiac Enzymes: No results for input(s): CKTOTAL, CKMB, CKMBINDEX, TROPONINI in the last 168 hours. BNP (last 3 results) No results for input(s): PROBNP in the last 8760 hours. HbA1C: No results for input(s): HGBA1C in the last 72 hours. CBG: Recent Labs  Lab 05/08/19 1538 05/08/19 2044 05/09/19 0020 05/09/19 0423 05/09/19 0750  GLUCAP 99 101* 114* 111* 103*   Lipid Profile: No results for input(s): CHOL, HDL, LDLCALC, TRIG, CHOLHDL, LDLDIRECT in the last 72 hours. Thyroid Function Tests: No results for input(s): TSH, T4TOTAL, FREET4, T3FREE, THYROIDAB in the last 72 hours.  Anemia Panel: No results for input(s): VITAMINB12, FOLATE, FERRITIN, TIBC, IRON, RETICCTPCT in the last 72 hours. Sepsis Labs: No results for input(s): PROCALCITON, LATICACIDVEN in the last 168 hours.  No results found for this or any previous visit (from the past 240 hour(s)).       Radiology Studies: No results found.      Scheduled Meds: . acetaminophen  1,000 mg Oral Q8H  . Chlorhexidine Gluconate Cloth  6 each Topical Daily  . feeding supplement (ENSURE ENLIVE)  237 mL Oral TID BM  . methocarbamol  500 mg Oral TID  . multivitamin with minerals  1 tablet Oral Daily  . saccharomyces boulardii  250 mg Oral BID   Continuous Infusions: . sodium chloride 500 mL (05/07/19 0650)  . feeding supplement (VITAL AF 1.2 CAL) 1,000 mL (05/08/19 2145)          Glade LloydKshitiz Safir Michalec, MD [facility] 05/09/2019, 8:15 AM

## 2019-05-10 LAB — GLUCOSE, CAPILLARY
Glucose-Capillary: 80 mg/dL (ref 70–99)
Glucose-Capillary: 88 mg/dL (ref 70–99)
Glucose-Capillary: 74 mg/dL (ref 70–99)
Glucose-Capillary: 82 mg/dL (ref 70–99)
Glucose-Capillary: 134 mg/dL — ABNORMAL HIGH (ref 70–99)
Glucose-Capillary: 144 mg/dL — ABNORMAL HIGH (ref 70–99)
Glucose-Capillary: 108 mg/dL — ABNORMAL HIGH (ref 70–99)

## 2019-05-10 LAB — CBC WITH DIFFERENTIAL/PLATELET
Abs Immature Granulocytes: 0.15 10*3/uL — ABNORMAL HIGH (ref 0.00–0.07)
Basophils Absolute: 0 10*3/uL (ref 0.0–0.1)
Basophils Relative: 0 %
Eosinophils Absolute: 0.1 10*3/uL (ref 0.0–0.5)
Eosinophils Relative: 1 %
HCT: 24 % — ABNORMAL LOW (ref 36.0–46.0)
Hemoglobin: 7.6 g/dL — ABNORMAL LOW (ref 12.0–15.0)
Immature Granulocytes: 1 %
Lymphocytes Relative: 6 %
Lymphs Abs: 0.9 10*3/uL (ref 0.7–4.0)
MCH: 30 pg (ref 26.0–34.0)
MCHC: 31.7 g/dL (ref 30.0–36.0)
MCV: 94.9 fL (ref 80.0–100.0)
Monocytes Absolute: 1 10*3/uL (ref 0.1–1.0)
Monocytes Relative: 7 %
Neutro Abs: 12.4 10*3/uL — ABNORMAL HIGH (ref 1.7–7.7)
Neutrophils Relative %: 85 %
Platelets: 683 10*3/uL — ABNORMAL HIGH (ref 150–400)
RBC: 2.53 MIL/uL — ABNORMAL LOW (ref 3.87–5.11)
RDW: 19.2 % — ABNORMAL HIGH (ref 11.5–15.5)
WBC: 14.7 10*3/uL — ABNORMAL HIGH (ref 4.0–10.5)
nRBC: 0 % (ref 0.0–0.2)

## 2019-05-10 LAB — BASIC METABOLIC PANEL
Anion gap: 7 (ref 5–15)
BUN: 11 mg/dL (ref 8–23)
CO2: 28 mmol/L (ref 22–32)
Calcium: 8.1 mg/dL — ABNORMAL LOW (ref 8.9–10.3)
Chloride: 103 mmol/L (ref 98–111)
Creatinine, Ser: 0.6 mg/dL (ref 0.44–1.00)
GFR calc Af Amer: >60 mL/min (ref >60)
GFR calc non Af Amer: >60 mL/min (ref >60)
Glucose, Bld: 79 mg/dL (ref 70–99)
Potassium: 4.2 mmol/L (ref 3.5–5.1)
Sodium: 138 mmol/L (ref 135–145)

## 2019-05-10 LAB — MAGNESIUM: Magnesium: 1.9 mg/dL (ref 1.7–2.4)

## 2019-05-10 NOTE — Progress Notes (Signed)
12 Days Post-Op   Subjective/Chief Complaint: Complains only of intermittent abdominal pain   Objective: Vital signs in last 24 hours: Temp:  [98 F (36.7 C)-98.7 F (37.1 C)] 98.3 F (36.8 C) (10/12 0448) Pulse Rate:  [73-88] 73 (10/12 0448) Resp:  [16-22] 18 (10/12 0448) BP: (117-145)/(60-79) 117/60 (10/12 0448) SpO2:  [94 %-98 %] 94 % (10/12 0448) Weight:  [82.9 kg] 82.9 kg (10/12 0450) Last BM Date: (ostomy)  Intake/Output from previous day: 10/11 0701 - 10/12 0700 In: 1112 [P.O.:440; NG/GT:672] Out: 500 [Urine:200; Stool:300] Intake/Output this shift: No intake/output data recorded.  Abd - soft, functioning ostomy - pink, viable; midline incision clean with wet to dry dressings  Lab Results:  Recent Labs    05/09/19 0314 05/10/19 0314  WBC 17.5* 14.7*  HGB 7.4* 7.6*  HCT 22.7* 24.0*  PLT 709* 683*   BMET Recent Labs    05/09/19 0314 05/10/19 0314  NA 137 138  K 4.0 4.2  CL 103 103  CO2 27 28  GLUCOSE 108* 79  BUN 13 11  CREATININE 0.52 0.60  CALCIUM 8.1* 8.1*   PT/INR No results for input(s): LABPROT, INR in the last 72 hours. ABG No results for input(s): PHART, HCO3 in the last 72 hours.  Invalid input(s): PCO2, PO2  Studies/Results: No results found.  Anti-infectives: Anti-infectives (From admission, onward)   Start     Dose/Rate Route Frequency Ordered Stop   05/04/19 1400  piperacillin-tazobactam (ZOSYN) IVPB 3.375 g  Status:  Discontinued     3.375 g 12.5 mL/hr over 240 Minutes Intravenous Every 8 hours 05/04/19 1304 05/07/19 1043   04/29/19 1400  piperacillin-tazobactam (ZOSYN) IVPB 2.25 g  Status:  Discontinued     2.25 g 100 mL/hr over 30 Minutes Intravenous Every 8 hours 04/29/19 1101 05/04/19 1304   04/27/19 1000  cefTRIAXone (ROCEPHIN) 2 g in sodium chloride 0.9 % 100 mL IVPB  Status:  Discontinued     2 g 200 mL/hr over 30 Minutes Intravenous Every 24 hours 04/27/19 0859 04/29/19 1101   04/27/19 1000  metroNIDAZOLE (FLAGYL)  IVPB 500 mg  Status:  Discontinued     500 mg 100 mL/hr over 60 Minutes Intravenous Every 8 hours 04/27/19 0859 04/29/19 1101   04/26/19 2200  piperacillin-tazobactam (ZOSYN) IVPB 3.375 g  Status:  Discontinued     3.375 g 12.5 mL/hr over 240 Minutes Intravenous Every 12 hours 04/26/19 1253 04/27/19 0858   04/26/19 1800  aztreonam (AZACTAM) 1 g in sodium chloride 0.9 % 100 mL IVPB  Status:  Discontinued     1 g 200 mL/hr over 30 Minutes Intravenous Every 12 hours 04/26/19 0805 04/26/19 1253   04/25/19 2030  ciprofloxacin (CIPRO) IVPB 200 mg  Status:  Discontinued     200 mg 100 mL/hr over 60 Minutes Intravenous Every 12 hours 04/25/19 1109 04/25/19 1640   04/25/19 2030  aztreonam (AZACTAM) 1 g in sodium chloride 0.9 % 100 mL IVPB  Status:  Discontinued     1 g 200 mL/hr over 30 Minutes Intravenous Every 8 hours 04/25/19 1651 04/26/19 0805   04/25/19 1730  aztreonam (AZACTAM) 2 g in sodium chloride 0.9 % 100 mL IVPB  Status:  Discontinued     2 g 200 mL/hr over 30 Minutes Intravenous  Once 04/25/19 1640 04/25/19 1651   04/25/19 1600  metroNIDAZOLE (FLAGYL) IVPB 500 mg  Status:  Discontinued     500 mg 100 mL/hr over 60 Minutes Intravenous Every 8 hours 04/25/19  1109 04/26/19 1253   04/25/19 0730  ciprofloxacin (CIPRO) IVPB 400 mg     400 mg 200 mL/hr over 60 Minutes Intravenous  Once 04/25/19 0729 04/25/19 0921   04/25/19 0730  metroNIDAZOLE (FLAGYL) IVPB 500 mg     500 mg 100 mL/hr over 60 Minutes Intravenous  Once 04/25/19 0729 04/25/19 0998      Assessment/Plan: Sepsis Encephalopathy AKI-Improved; renal has signed off. ABL anemia -currently 7.6.  Transfusion decision per primary team Malnutrition-prealbumin20.9 HxConstipation Tobacco abuse   Severe sigmoid diverticulitis with multiple pelvic abscesses; largest 11.4 x 2.4 x 2.1 Exploratory laparotomywith sigmoid colectomy, colostomy, and Stamm gastrostomy tube placement, Dr. Luisa Hart 9/30 for expected severe  diverticulitis with multiple pelvic abscesses POD#10, -Pathology with diverticulitis -Stoma viable - BID wet to dry dressing changes. Continue abdominal binder to protect G tube - monitor JP drainage -. Add IV ativan PRN anxiety - Leukocytosis -14.7 - improving ID -no current abx VTE -SCDs,  FEN -regular diet/tube feeding -cyclic in p.m to try to increase appetite during the day Foley -foley out Follow up -Dr. Luisa Hart   Plan: Patient is being evaluated for CIR; continue therapies and hopefully transfer to CIR.  LOS: 15 days    Joanne Burke 05/10/2019

## 2019-05-10 NOTE — Progress Notes (Addendum)
Calorie Count Note  48 hour calorie count ordered.  Diet: soft Supplements: MVI with minerals daily; Ensure Enlive po TID, each supplement provides 350 kcal and 20 grams of protein; MVI with minerals daily; Magic cup TID with meals, each supplement provides 290 kcal and 9 grams of protein; nocturnal feedings: Vital 1.2 @ 60 ml/hr over 12 hour period (2000-0800)- regimen to provide 864 kcals, 54 grams protein, and 584 ml free water daily, meeting 56% of estimated kcal needs and 64% of estimated protein needs  05/07/19 Breakfast: 125 kcals, 6 grams protein Lunch: 189 kcals, 8 grams protein Dinner: 29 kcals, 1 gram protein  Total intake: 343 kcal (22% of minimum estimated needs)  15 grams protein (18% of minimum estimated needs)  05/08/19 Breakfast: 320 kcals, 15 grams protein Lunch: 245 kcals, 4 grams protein Dinner: 5 kcals, 0 grams  Total intake: 570 kcal (37% of minimum estimated needs)  19 grams protein (22% of minimum estimated needs)  05/09/19 Breakfast: 513 kcals, 25 grams protein Lunch: nothing documented Dinner: 66 kcals, 3 grams protein  Total intake: 579 kcal (37% of minimum estimated needs)  28 grams protein (33% of minimum estimated needs)  Average Total intake (PO's only): 497 kcal (32% of minimum estimated needs)  21 grams protein (25% of minimum estimated needs)  Average Total intake (PO's + TF): 1361 kcal (89% of minimum estimated needs)  75 grams protein (88% of minimum estimated needs)  Nutrition Dx: Increased nutrient needsrelated to post-op healingas evidenced by estimated needs; Ongoing  Goal: Patient will meet greater than or equal to 90% of their needs; Progressing   Intervention:   -D/c calorie count -ContinueMVI with minerals daily -Ensure Enlive poTID, each supplement provides 350 kcal and 20 grams of protein -Continue Magic cup TID with meals, each supplement provides 290 kcal and 9 grams of protein -Continue  nocturnal feedings:  Vital 1.2 @ 60 ml/hr over 12 hour period (2000-0800)- regimen to provide 864 kcals, 54 grams protein, and 584 ml free water daily, meeting 56% of estimated kcal needs and 64% of estimated protein needs  Joanne Burke, RD, LDN, [place] Registered Dietitian II Certified Diabetes Care and Education Specialist Pager: [phone] After hours Pager: [phone]

## 2019-05-10 NOTE — Consult Note (Signed)
[place] Nurse ostomy follow up Stoma type/location: RLQ end ileostomy Patient with eyes closed and has not attempted to empty pouch since last teaching session.  She indicates she will have nurses helping.  I informed her that she or her husband would need to learn care  That the pouch could leak at any time and someone needed to be able to care for pouch. She is going to ask her husband to come Wednesday PM. Stomal assessment/size: 2" edematous. Pink patent and producing liquid brown stool Peristomal assessment: intact Treatment options for stomal/peristomal skin: 2 3/4" pouch.  Well budded stoma.  Removal of old pouch was difficult due to adhesion of ring.  I do not feel she needs this due to well developed stoma so I pouched without barrier ring today.   Output liquid brown stool Ostomy pouching: 2pc.  2 3/4" pouch Education provided:  Patient would not participate in care. States "I don't feel good"  Enrolled patient in [place] Start Discharge program: Yes today,  [place] team will follow.  Domenic Moras MSN, RN, FNP-BC [facility] Wound, Ostomy, Continence Nurse Pager [phone]

## 2019-05-10 NOTE — Progress Notes (Signed)
Patient ID: Joanne Burke, female   DOB: 01-03-1931, 85 y.o.   MRN: [id number]  PROGRESS NOTE    HARIKA LAIDLAW  GHW:[id number] DOB: 12/15/1930 DOA: 04/25/2019 PCP: Josetta Huddle, MD   Brief Narrative:  85 year old female with no significant past medical history presented with abdominal pain.  She was found to have CT evidence of possible diverticulitis, abscess versus fluid in the abdomen along with acute kidney injury, lactic acidosis and leukocytosis.  Surgery was consulted.  Nephrology was also consulted for acute kidney injury.  She underwent exploratory laparotomy with sigmoid colectomy, colostomy and gastrostomy tube placement with mobilization of splenic flexure.  She was treated with intravenous antibiotics.  Assessment & Plan:   Sigmoid diverticulitis with multiple pelvic abscesses -Status post exploratory laparotomy with sigmoid colectomy, colostomy and gastrostomy tube placement on 04/28/2019 -General surgery following.  Wound care as per general surgery recommendations.  Continue G-tube feeding. -Zosyn stopped on 05/07/2019 by general surgery.  Leukocytosis -White cell count elevated but improving, 14.7 today.  Monitor.  -Has remained afebrile currently.  Anemia of chronic disease -Hemoglobin 7.6 today.  Transfuse if hemoglobin is less than 7.  AKI Non-anion gap metabolic acidosis -Resolved.  Nephrology signed off.  Successfully passed voiding trial  Hypokalemia -Improved  Hypomagnesemia -Improved.  Acute respiratory failure with hypoxia -Resolved. -Chest x-ray showed patchy bilateral airspace opacities concerning for pneumonia on 04/27/2019, could be aspiration pneumonia or pulmonary edema.  She is currently on Zosyn. -Currently on room air.  Generalized conditioning -PT recommends SNF placement.  Family not interested in SNF.  PT now recommending CIR.  CIR consulted.   -Palliative Care consulted for goals of care discussion.  Patient remains full code.  Acute  metabolic encephalopathy, likely multifactorial -Most likely secondary to narcotic medication induced in the setting of recent abdominal surgery along with pain/hyponatremia/AKI.  Had received multiple doses of Ativan for agitation -Mental status is much improved.  Monitor.  Avoid sedative medications as much as possible.   DVT prophylaxis: SCDs Code Status: Full Family Communication: None at bedside Disposition Plan: CIR versus home with home health once cleared by general surgery. Consultants: General surgery/nephrology/palliative care  Procedures:  exploratory laparotomy with sigmoid colectomy, colostomy and gastrostomy tube placement on 04/28/2019  Antimicrobials:  Anti-infectives (From admission, onward)   Start     Dose/Rate Route Frequency Ordered Stop   05/04/19 1400  piperacillin-tazobactam (ZOSYN) IVPB 3.375 g  Status:  Discontinued     3.375 g 12.5 mL/hr over 240 Minutes Intravenous Every 8 hours 05/04/19 1304 05/07/19 1043   04/29/19 1400  piperacillin-tazobactam (ZOSYN) IVPB 2.25 g  Status:  Discontinued     2.25 g 100 mL/hr over 30 Minutes Intravenous Every 8 hours 04/29/19 1101 05/04/19 1304   04/27/19 1000  cefTRIAXone (ROCEPHIN) 2 g in sodium chloride 0.9 % 100 mL IVPB  Status:  Discontinued     2 g 200 mL/hr over 30 Minutes Intravenous Every 24 hours 04/27/19 0859 04/29/19 1101   04/27/19 1000  metroNIDAZOLE (FLAGYL) IVPB 500 mg  Status:  Discontinued     500 mg 100 mL/hr over 60 Minutes Intravenous Every 8 hours 04/27/19 0859 04/29/19 1101   04/26/19 2200  piperacillin-tazobactam (ZOSYN) IVPB 3.375 g  Status:  Discontinued     3.375 g 12.5 mL/hr over 240 Minutes Intravenous Every 12 hours 04/26/19 1253 04/27/19 0858   04/26/19 1800  aztreonam (AZACTAM) 1 g in sodium chloride 0.9 % 100 mL IVPB  Status:  Discontinued  1 g 200 mL/hr over 30 Minutes Intravenous Every 12 hours 04/26/19 0805 04/26/19 1253   04/25/19 2030  ciprofloxacin (CIPRO) IVPB 200 mg  Status:   Discontinued     200 mg 100 mL/hr over 60 Minutes Intravenous Every 12 hours 04/25/19 1109 04/25/19 1640   04/25/19 2030  aztreonam (AZACTAM) 1 g in sodium chloride 0.9 % 100 mL IVPB  Status:  Discontinued     1 g 200 mL/hr over 30 Minutes Intravenous Every 8 hours 04/25/19 1651 04/26/19 0805   04/25/19 1730  aztreonam (AZACTAM) 2 g in sodium chloride 0.9 % 100 mL IVPB  Status:  Discontinued     2 g 200 mL/hr over 30 Minutes Intravenous  Once 04/25/19 1640 04/25/19 1651   04/25/19 1600  metroNIDAZOLE (FLAGYL) IVPB 500 mg  Status:  Discontinued     500 mg 100 mL/hr over 60 Minutes Intravenous Every 8 hours 04/25/19 1109 04/26/19 1253   04/25/19 0730  ciprofloxacin (CIPRO) IVPB 400 mg     400 mg 200 mL/hr over 60 Minutes Intravenous  Once 04/25/19 0729 04/25/19 0921   04/25/19 0730  metroNIDAZOLE (FLAGYL) IVPB 500 mg     500 mg 100 mL/hr over 60 Minutes Intravenous  Once 04/25/19 0729 04/25/19 [id number]        Subjective: Patient seen and examined at bedside.  Denies any overnight fever, vomiting.  Complains intermittent abdominal pain.  Poor historian.   Objective: Vitals:   05/09/19 1543 05/09/19 2015 05/10/19 0448 05/10/19 0450  BP: (!) 145/79 124/68 117/60   Pulse: 88 80 73   Resp: (!) 22 18 18    Temp: 98 F (36.7 C) 98.7 F (37.1 C) 98.3 F (36.8 C)   TempSrc: Oral Oral Oral   SpO2: 98% 95% 94%   Weight:    82.9 kg  Height:        Intake/Output Summary (Last 24 hours) at 05/10/2019 0801 Last data filed at 05/09/2019 1629 Gross per 24 hour  Intake 1112 ml  Output 500 ml  Net 612 ml   Filed Weights   05/08/19 0428 05/09/19 0425 05/10/19 0450  Weight: 84.1 kg 84.6 kg 82.9 kg    Examination:  General exam: No acute distress.  Elderly female lying in bed.  Very poor historian.  Flat affect.   Respiratory system: Bilateral decreased breath sounds at bases, no wheezing.  Some crackles  cardiovascular system: Rate controlled, S1-S2 heard Gastrointestinal system:  Abdomen is nondistended, soft and has dressing present. Normal bowel sounds heard.  Colostomy present Extremities: No cyanosis, edema    Data Reviewed: I have personally reviewed following labs and imaging studies  CBC: Recent Labs  Lab 05/06/19 0223 05/07/19 0312 05/08/19 0155 05/09/19 0314 05/10/19 0314  WBC 28.8* 23.8* 17.5* 17.5* 14.7*  NEUTROABS 25.6* 21.1* 15.3* 15.1* 12.4*  HGB 7.9* 7.8* 7.2* 7.4* 7.6*  HCT 22.6* 22.6* 22.5* 22.7* 24.0*  MCV 89.7 91.9 94.1 93.4 94.9  PLT 584* 672* 733* 709* 683*   Basic Metabolic Panel: Recent Labs  Lab 05/04/19 0210  05/05/19 0233 05/06/19 0223 05/07/19 0312 05/08/19 0155 05/09/19 0314 05/10/19 0314  NA 134*  --  135 138 137 138 137 138  K 3.8  --  3.0* 3.2* 4.0 3.7 4.0 4.2  CL 105  --  100 102 102 104 103 103  CO2 21*  --  23 26 25 25 27 28   GLUCOSE 85  --  107* 89 117* 119* 108* 79  BUN 19  --  15 11 12 14 13 11   CREATININE 0.62  --  0.62 0.56 0.61 0.55 0.52 0.60  CALCIUM 7.2*  --  7.4* 7.7* 8.0* 7.8* 8.1* 8.1*  MG  --    < > 1.5* 1.7 1.6* 2.0 1.9 1.9  PHOS 3.1  --  2.7 2.5 2.4* 2.7  --   --    < > = values in this interval not displayed.   GFR: Estimated Creatinine Clearance: 50.4 mL/min (by C-G formula based on SCr of 0.6 mg/dL). Liver Function Tests: Recent Labs  Lab 05/04/19 0210  ALBUMIN 1.6*   No results for input(s): LIPASE, AMYLASE in the last 168 hours. No results for input(s): AMMONIA in the last 168 hours. Coagulation Profile: No results for input(s): INR, PROTIME in the last 168 hours. Cardiac Enzymes: No results for input(s): CKTOTAL, CKMB, CKMBINDEX, TROPONINI in the last 168 hours. BNP (last 3 results) No results for input(s): PROBNP in the last 8760 hours. HbA1C: No results for input(s): HGBA1C in the last 72 hours. CBG: Recent Labs  Lab 05/09/19 1150 05/09/19 1619 05/09/19 2013 05/10/19 0057 05/10/19 0446  GLUCAP 97 141* 87 80 88   Lipid Profile: No results for input(s): CHOL, HDL,  LDLCALC, TRIG, CHOLHDL, LDLDIRECT in the last 72 hours. Thyroid Function Tests: No results for input(s): TSH, T4TOTAL, FREET4, T3FREE, THYROIDAB in the last 72 hours. Anemia Panel: No results for input(s): VITAMINB12, FOLATE, FERRITIN, TIBC, IRON, RETICCTPCT in the last 72 hours. Sepsis Labs: No results for input(s): PROCALCITON, LATICACIDVEN in the last 168 hours.  No results found for this or any previous visit (from the past 240 hour(s)).       Radiology Studies: No results found.      Scheduled Meds: . acetaminophen  1,000 mg Oral Q8H  . Chlorhexidine Gluconate Cloth  6 each Topical Daily  . feeding supplement (ENSURE ENLIVE)  237 mL Oral TID BM  . methocarbamol  500 mg Oral TID  . multivitamin with minerals  1 tablet Oral Daily  . pantoprazole  40 mg Oral Daily  . saccharomyces boulardii  250 mg Oral BID   Continuous Infusions: . sodium chloride 500 mL (05/07/19 0650)  . feeding supplement (VITAL AF 1.2 CAL)            07/07/19, MD [facility] 05/10/2019, 8:01 AM

## 2019-05-10 NOTE — Progress Notes (Signed)
Physical Therapy Treatment Patient Details Name: Joanne Burke MRN: [id number] DOB: 08/20/1930 Today's Date: 05/10/2019    History of Present Illness 85 y.o. female with no significant medical history presenting with abdominal pain. Pt admitted for diverticulitis of colon with perforation, sepsis. On 04/28/19 she underwent sigmoid colectomy, colostomy and Stamm gastrostomy tube placement.    PT Comments    Pt performed gt training and functional mobility during session this pm.  She is slow and guarded and required cueing to improve ease of movement.  Pt reports stomach pain during session and requesting pain meds post session.  She is motivated and able to progress gt distance this session.  Husband present in room and very supportive of his wife.  Will continue to recommend aggressive CIR therapies to improve strength and function before returning home.  Husband reports she was weeding eating the yard before she became ill.      Follow Up Recommendations  Supervision/Assistance - 24 hour;CIR     Equipment Recommendations  Rolling walker with 5" wheels;3in1 (PT)    Recommendations for Other Services       Precautions / Restrictions Precautions Precautions: Fall Precaution Comments: bulb drain, colostomy, G tube Restrictions Weight Bearing Restrictions: No    Mobility  Bed Mobility Overal bed mobility: Needs Assistance Bed Mobility: Supine to Sit Rolling: Min guard Sidelying to sit: Min guard;HOB elevated       General bed mobility comments: Cues for rolling and moving to sit edge of bed. Min guard for safety.  Transfers Overall transfer level: Needs assistance Equipment used: Rolling walker (2 wheeled) Transfers: Sit to/from Stand Sit to Stand: Min assist         General transfer comment: min assistance to boost into standing.  Cues for hand placement to and from seated surface.  Ambulation/Gait Ambulation/Gait assistance: Min assist;Mod assist(required  increased assistance as she fatigued.) Gait Distance (Feet): 30 Feet Assistive device: Rolling walker (2 wheeled) Gait Pattern/deviations: Decreased stride length;Step-to pattern;Trunk flexed Gait velocity: decreased   General Gait Details: Cues for upper trunk control and sequencing.  Pt begins to lean B elbows on RW when fatigued as RW is too high on lowest setting.  Required moderate assistance to turn and back with fatigue.   Stairs             Wheelchair Mobility    Modified Rankin (Stroke Patients Only)       Balance Overall balance assessment: Needs assistance Sitting-balance support: Bilateral upper extremity supported;Feet unsupported Sitting balance-Leahy Scale: Fair Sitting balance - Comments: posterior bias initially     Standing balance-Leahy Scale: Poor                              Cognition Arousal/Alertness: Awake/alert Behavior During Therapy: WFL for tasks assessed/performed Overall Cognitive Status: Impaired/Different from baseline Area of Impairment: Memory;Problem solving                 Orientation Level: Disoriented to;Time   Memory: Decreased short-term memory Following Commands: Follows one step commands with increased time     Problem Solving: Slow processing;Difficulty sequencing General Comments: Decreased short term memory recall noted      Exercises General Exercises - Lower Extremity Ankle Circles/Pumps: AROM;10 reps;Supine Quad Sets: AROM;Both;10 reps;Supine Long Arc Quad: AROM;Both;10 reps;Seated Hip ABduction/ADduction: AROM;Both;10 reps;Supine    General Comments        Pertinent Vitals/Pain Pain Assessment: 0-10 Pain Score: 7  Pain  Location: abdomen Pain Descriptors / Indicators: Grimacing;Guarding;Discomfort Pain Intervention(s): Monitored during session;Repositioned    Home Living                      Prior Function            PT Goals (current goals can now be found in the  care plan section) Acute Rehab PT Goals Patient Stated Goal: decrease pain Potential to Achieve Goals: Good Progress towards PT goals: Progressing toward goals    Frequency    Min 3X/week      PT Plan Current plan remains appropriate    Co-evaluation              AM-PAC PT "6 Clicks" Mobility   Outcome Measure  Help needed turning from your back to your side while in a flat bed without using bedrails?: A Little Help needed moving from lying on your back to sitting on the side of a flat bed without using bedrails?: A Little Help needed moving to and from a bed to a chair (including a wheelchair)?: A Little Help needed standing up from a chair using your arms (e.g., wheelchair or bedside chair)?: A Little Help needed to walk in hospital room?: A Lot Help needed climbing 3-5 steps with a railing? : A Lot 6 Click Score: 16    End of Session Equipment Utilized During Treatment: Gait belt Activity Tolerance: Patient tolerated treatment well Patient left: with call bell/phone within reach;in chair;with chair alarm set Nurse Communication: Mobility status PT Visit Diagnosis: Other abnormalities of gait and mobility (R26.89);Pain;Muscle weakness (generalized) (M62.81)     Time: 0254-2706 PT Time Calculation (min) (ACUTE ONLY): 20 min  Charges:  $Gait Training: 8-22 mins                     Joycelyn Rua, PTA Acute Rehabilitation Services Pager [phone] Office [phone]     Joanne Burke Artis Delay 05/10/2019, 3:03 PM

## 2019-05-10 NOTE — Progress Notes (Signed)
Inpatient Rehab Admissions Coordinator:   Met with pt and her husband at bedside to confirm 24/7 support at discharge.  Awaiting determination from insurance company, will need to send updated PT/OT/MD notes from today for authorization.  Will continue to follow and plan for possible admission tomorrow or Wednesday pending bed availability and insurance authorization.   Shann Medal, PT, DPT Admissions Coordinator [phone] 05/10/19  4:02 PM

## 2019-05-10 NOTE — Progress Notes (Signed)
Occupational Therapy Treatment Patient Details Name: LAPRECIOUS AUSTILL MRN: [id number] DOB: 01-01-1931 Today's Date: 05/10/2019    History of present illness 85 y.o. female with no significant medical history presenting with abdominal pain. Pt admitted for diverticulitis of colon with perforation, sepsis. On 04/28/19 she underwent sigmoid colectomy, colostomy and Stamm gastrostomy tube placement.   OT comments  Pt sitting in bed upon arrival, agreeable to OT session. Pt demonstrates good motivation to participate with therapy. Pt currently requires minguard for bed mobility and minA for ADL completion and functional mobility at RW level. Pt progressing toward established OT goals, feel she would benefit greatly from CIR level therapies prior to returning home with her husband who is available to provide 24/7 support. Pt will continue to benefit from skilled OT services to maximize safety and independence with ADL/IADL and functional mobility. Will continue to follow acutely and progress as tolerated.    Follow Up Recommendations  CIR    Equipment Recommendations  None recommended by OT    Recommendations for Other Services      Precautions / Restrictions Precautions Precautions: Fall Precaution Comments: bulb drain, colostomy, G tube Restrictions Weight Bearing Restrictions: No       Mobility Bed Mobility Overal bed mobility: Needs Assistance Bed Mobility: Supine to Sit;Sit to Supine Rolling: Min guard Sidelying to sit: Min guard;HOB elevated Supine to sit: Min guard Sit to supine: Min guard   General bed mobility comments: Cues for rolling and moving to sit edge of bed. Min guard for safety.  Transfers Overall transfer level: Needs assistance Equipment used: Rolling walker (2 wheeled) Transfers: Sit to/from Stand Sit to Stand: Min assist         General transfer comment: min assistance to boost into standing.  Cues for hand placement to and from seated surface.     Balance Overall balance assessment: Needs assistance Sitting-balance support: Bilateral upper extremity supported;Feet unsupported Sitting balance-Leahy Scale: Fair Sitting balance - Comments: posterior bias initially   Standing balance support: Bilateral upper extremity supported;During functional activity Standing balance-Leahy Scale: Poor Standing balance comment: reliant on BUE support on external source, rested forearms on sink while washing sink                           ADL either performed or assessed with clinical judgement   ADL Overall ADL's : Needs assistance/impaired     Grooming: Minimal assistance;Standing;Wash/dry hands Grooming Details (indicate cue type and reason): standing at sink level             Lower Body Dressing: Minimal assistance;Sit to/from stand Lower Body Dressing Details (indicate cue type and reason): able to adjust socks sitting EOB  Toilet Transfer: Minimal assistance;Ambulation;RW Toilet Transfer Details (indicate cue type and reason): simulated in room          Functional mobility during ADLs: Minimal assistance;Rolling walker General ADL Comments: decreased activity tolerance and endurance;pt required minA with in room mobility at [facility] level     Vision       Perception     Praxis      Cognition Arousal/Alertness: Awake/alert Behavior During Therapy: WFL for tasks assessed/performed Overall Cognitive Status: Impaired/Different from baseline Area of Impairment: Memory;Problem solving                 Orientation Level: Disoriented to;Time   Memory: Decreased short-term memory Following Commands: Follows one step commands with increased time     Problem Solving: Slow  processing;Difficulty sequencing General Comments: Decreased short term memory recall noted        Exercises General Exercises - Lower Extremity Ankle Circles/Pumps: AROM;10 reps;Supine Quad Sets: AROM;Both;10 reps;Supine Long Arc Quad:  AROM;Both;10 reps;Seated Hip ABduction/ADduction: AROM;Both;10 reps;Supine   Shoulder Instructions       General Comments      Pertinent Vitals/ Pain       Pain Assessment: 0-10 Pain Score: 4  Pain Location: abdomen Pain Descriptors / Indicators: Grimacing;Guarding;Discomfort Pain Intervention(s): Limited activity within patient's tolerance;Monitored during session;Repositioned  Home Living                                          Prior Functioning/Environment              Frequency  Min 2X/week        Progress Toward Goals  OT Goals(current goals can now be found in the care plan section)  Progress towards OT goals: Progressing toward goals  Acute Rehab OT Goals Patient Stated Goal: to go to CIR OT Goal Formulation: With patient Time For Goal Achievement: 05/18/19 Potential to Achieve Goals: Good ADL Goals Pt Will Perform Grooming: with supervision;standing Pt Will Perform Lower Body Dressing: with min assist;sit to/from stand Pt Will Transfer to Toilet: with min assist;bedside commode Pt Will Perform Toileting - Clothing Manipulation and hygiene: with supervision;sit to/from stand  Plan Discharge plan needs to be updated    Co-evaluation                 AM-PAC OT "6 Clicks" Daily Activity     Outcome Measure   Help from another person eating meals?: A Little Help from another person taking care of personal grooming?: A Little Help from another person toileting, which includes using toliet, bedpan, or urinal?: A Little Help from another person bathing (including washing, rinsing, drying)?: A Little Help from another person to put on and taking off regular upper body clothing?: A Little Help from another person to put on and taking off regular lower body clothing?: A Little 6 Click Score: 18    End of Session Equipment Utilized During Treatment: Rolling walker  OT Visit Diagnosis: Unsteadiness on feet (R26.81);Other  abnormalities of gait and mobility (R26.89);Muscle weakness (generalized) (M62.81);History of falling (Z91.81)   Activity Tolerance Patient tolerated treatment well   Patient Left in bed;with call bell/phone within reach;with bed alarm set;with family/visitor present   Nurse Communication          Time: 8101-7510 OT Time Calculation (min): 13 min  Charges: OT General Charges $OT Visit: 1 Visit OT Treatments $Self Care/Home Management : 8-22 mins  Diona Browner OTR/L Acute Rehabilitation Services Office: [phone]    Rebeca Alert 05/10/2019, 4:39 PM

## 2019-05-11 LAB — CBC WITH DIFFERENTIAL/PLATELET
Abs Immature Granulocytes: 0.14 10*3/uL — ABNORMAL HIGH (ref 0.00–0.07)
Basophils Absolute: 0.1 10*3/uL (ref 0.0–0.1)
Basophils Relative: 0 %
Eosinophils Absolute: 0.1 10*3/uL (ref 0.0–0.5)
Eosinophils Relative: 1 %
HCT: 22.3 % — ABNORMAL LOW (ref 36.0–46.0)
Hemoglobin: 7.3 g/dL — ABNORMAL LOW (ref 12.0–15.0)
Immature Granulocytes: 1 %
Lymphocytes Relative: 6 %
Lymphs Abs: 0.9 10*3/uL (ref 0.7–4.0)
MCH: 30.8 pg (ref 26.0–34.0)
MCHC: 32.7 g/dL (ref 30.0–36.0)
MCV: 94.1 fL (ref 80.0–100.0)
Monocytes Absolute: 1.1 10*3/uL — ABNORMAL HIGH (ref 0.1–1.0)
Monocytes Relative: 8 %
Neutro Abs: 12.2 10*3/uL — ABNORMAL HIGH (ref 1.7–7.7)
Neutrophils Relative %: 84 %
Platelets: 593 10*3/uL — ABNORMAL HIGH (ref 150–400)
RBC: 2.37 MIL/uL — ABNORMAL LOW (ref 3.87–5.11)
RDW: 19 % — ABNORMAL HIGH (ref 11.5–15.5)
WBC: 14.5 10*3/uL — ABNORMAL HIGH (ref 4.0–10.5)
nRBC: 0 % (ref 0.0–0.2)

## 2019-05-11 LAB — GLUCOSE, CAPILLARY
Glucose-Capillary: 106 mg/dL — ABNORMAL HIGH (ref 70–99)
Glucose-Capillary: 113 mg/dL — ABNORMAL HIGH (ref 70–99)
Glucose-Capillary: 116 mg/dL — ABNORMAL HIGH (ref 70–99)
Glucose-Capillary: 91 mg/dL (ref 70–99)
Glucose-Capillary: 93 mg/dL (ref 70–99)
Glucose-Capillary: 98 mg/dL (ref 70–99)

## 2019-05-11 LAB — BASIC METABOLIC PANEL
Anion gap: 10 (ref 5–15)
BUN: 10 mg/dL (ref 8–23)
CO2: 26 mmol/L (ref 22–32)
Calcium: 8 mg/dL — ABNORMAL LOW (ref 8.9–10.3)
Chloride: 101 mmol/L (ref 98–111)
Creatinine, Ser: 0.61 mg/dL (ref 0.44–1.00)
GFR calc Af Amer: >60 mL/min (ref >60)
GFR calc non Af Amer: >60 mL/min (ref >60)
Glucose, Bld: 102 mg/dL — ABNORMAL HIGH (ref 70–99)
Potassium: 3.6 mmol/L (ref 3.5–5.1)
Sodium: 137 mmol/L (ref 135–145)

## 2019-05-11 LAB — MAGNESIUM: Magnesium: 1.9 mg/dL (ref 1.7–2.4)

## 2019-05-11 NOTE — Progress Notes (Signed)
Patient ID: Joanne Burke, female   DOB: [birth year], 85 y.o.   MRN: [id number]    13 Days Post-Op  Subjective: Laying in bed in NAD.  Denies abdominal pain.  Thinks she ate well this morning.  Says she is mobilizing with therapies.  Objective: Vital signs in last 24 hours: Temp:  [98.1 F (36.7 C)-98.8 F (37.1 C)] 98.6 F (37 C) (10/13 0800) Pulse Rate:  [72-86] 72 (10/13 0800) Resp:  [14-18] 14 (10/13 0800) BP: (118-129)/(63-72) 125/63 (10/13 0800) SpO2:  [95 %-99 %] 95 % (10/13 0800) Weight:  [82 kg] 82 kg (10/13 0449) Last BM Date: 05/10/19  Intake/Output from previous day: 10/12 0701 - 10/13 0700 In: 510 [P.O.:480] Out: -  Intake/Output this shift: No intake/output data recorded.  PE: Abd: soft, minimally tender, +BS, ostomy with feculent output, midline wound is clean and packed.  JP drain with feculent output.  g-tube in place and site is clean  Lab Results:  Recent Labs    05/10/19 0314 05/11/19 0255  WBC 14.7* 14.5*  HGB 7.6* 7.3*  HCT 24.0* 22.3*  PLT 683* 593*   BMET Recent Labs    05/10/19 0314 05/11/19 0255  NA 138 137  K 4.2 3.6  CL 103 101  CO2 28 26  GLUCOSE 79 102*  BUN 11 10  CREATININE 0.60 0.61  CALCIUM 8.1* 8.0*   PT/INR No results for input(s): LABPROT, INR in the last 72 hours. CMP     Component Value Date/Time   NA 137 05/11/2019 0255   K 3.6 05/11/2019 0255   CL 101 05/11/2019 0255   CO2 26 05/11/2019 0255   GLUCOSE 102 (H) 05/11/2019 0255   BUN 10 05/11/2019 0255   CREATININE 0.61 05/11/2019 0255   CALCIUM 8.0 (L) 05/11/2019 0255   PROT 6.1 (L) 04/25/2019 0355   ALBUMIN 1.6 (L) 05/04/2019 0210   AST 46 (H) 04/25/2019 0355   ALT 33 04/25/2019 0355   ALKPHOS 237 (H) 04/25/2019 0355   BILITOT 1.7 (H) 04/25/2019 0355   GFRNONAA >60 05/11/2019 0255   GFRAA >60 05/11/2019 0255   Lipase     Component Value Date/Time   LIPASE 15 04/25/2019 0355       Studies/Results: No results found.  Anti-infectives:  Anti-infectives (From admission, onward)   Start     Dose/Rate Route Frequency Ordered Stop   05/04/19 1400  piperacillin-tazobactam (ZOSYN) IVPB 3.375 g  Status:  Discontinued     3.375 g 12.5 mL/hr over 240 Minutes Intravenous Every 8 hours 05/04/19 1304 05/07/19 1043   04/29/19 1400  piperacillin-tazobactam (ZOSYN) IVPB 2.25 g  Status:  Discontinued     2.25 g 100 mL/hr over 30 Minutes Intravenous Every 8 hours 04/29/19 1101 05/04/19 1304   04/27/19 1000  cefTRIAXone (ROCEPHIN) 2 g in sodium chloride 0.9 % 100 mL IVPB  Status:  Discontinued     2 g 200 mL/hr over 30 Minutes Intravenous Every 24 hours 04/27/19 0859 04/29/19 1101   04/27/19 1000  metroNIDAZOLE (FLAGYL) IVPB 500 mg  Status:  Discontinued     500 mg 100 mL/hr over 60 Minutes Intravenous Every 8 hours 04/27/19 0859 04/29/19 1101   04/26/19 2200  piperacillin-tazobactam (ZOSYN) IVPB 3.375 g  Status:  Discontinued     3.375 g 12.5 mL/hr over 240 Minutes Intravenous Every 12 hours 04/26/19 1253 04/27/19 0858   04/26/19 1800  aztreonam (AZACTAM) 1 g in sodium chloride 0.9 % 100 mL IVPB  Status:  Discontinued  1 g 200 mL/hr over 30 Minutes Intravenous Every 12 hours 04/26/19 0805 04/26/19 1253   04/25/19 2030  ciprofloxacin (CIPRO) IVPB 200 mg  Status:  Discontinued     200 mg 100 mL/hr over 60 Minutes Intravenous Every 12 hours 04/25/19 1109 04/25/19 1640   04/25/19 2030  aztreonam (AZACTAM) 1 g in sodium chloride 0.9 % 100 mL IVPB  Status:  Discontinued     1 g 200 mL/hr over 30 Minutes Intravenous Every 8 hours 04/25/19 1651 04/26/19 0805   04/25/19 1730  aztreonam (AZACTAM) 2 g in sodium chloride 0.9 % 100 mL IVPB  Status:  Discontinued     2 g 200 mL/hr over 30 Minutes Intravenous  Once 04/25/19 1640 04/25/19 1651   04/25/19 1600  metroNIDAZOLE (FLAGYL) IVPB 500 mg  Status:  Discontinued     500 mg 100 mL/hr over 60 Minutes Intravenous Every 8 hours 04/25/19 1109 04/26/19 1253   04/25/19 0730  ciprofloxacin (CIPRO)  IVPB 400 mg     400 mg 200 mL/hr over 60 Minutes Intravenous  Once 04/25/19 0729 04/25/19 0921   04/25/19 0730  metroNIDAZOLE (FLAGYL) IVPB 500 mg     500 mg 100 mL/hr over 60 Minutes Intravenous  Once 04/25/19 0729 04/25/19 7169       Assessment/Plan Sepsis Encephalopathy AKI-Improved; renal has signed off. ABL anemia -currently 7.6.  Transfusion decision per primary team Malnutrition-prealbumin20.9 HxConstipation Tobacco abuse   POD 13, s/p Exploratory laparotomywith sigmoid colectomy, colostomy, and Stamm gastrostomy tube placement, Dr. Luisa Hart 9/30 for expected severe diverticulitis with multiple pelvic abscesses  -Pathology with diverticulitis -Stoma viable - BID wet to dry dressing changes.  -nightly TFs to assist with nutritional support - JP drain appears feculent in nature today.  This does not appear to be documented previously.  If this is the case, it would be concerning for a rectal stump leaking; however, as long as it is controlled through this drain, she is diverted proximal to this and likely would not need anything further unless she had symptoms of undrained collection. -WBC stable around 14.7, no fevers ID -no current abx VTE -SCDs,  FEN -regulardiet/tube feeding-cyclic in p.m to try to increase appetite during the day Foley -foley out Follow up -Dr. Luisa Hart Dispo - possibly CIR   LOS: 16 days    Letha Cape , [facility] 05/11/2019, 11:32 AM Please see Amion for pager number during day hours 7:00am-4:30pm

## 2019-05-11 NOTE — Progress Notes (Signed)
Daily Progress Note   Patient Name: Joanne Burke       Date: 05/11/2019 DOB: 03/01/1931  Age: 85 y.o. MRN#: [id number] Attending Physician: Aline August, MD Primary Care Physician: Josetta Huddle, MD Admit Date: 04/25/2019  Reason for Consultation/Follow-up: Establishing goals of care  Subjective: Met with patient and spouse. Gave support as spouse expressed some dissatisfaction with patient's stay- however, he also expressed how pleased he was that patient was improving.  Patient and spouse planning on meeting today with CIR for admission. Reviewed with patient and spouse the need for hoping for best- we are glad she is improving and hoping she continues to- but is helpful to also discuss the "what if"s things start to go downhill- at this point husband interjected- are you going to talk about dying? - I related to both I was not going to talk about dying- rather talk about how patient would prefer to live if her time of life was limited.  Patient and spouse shared that they had both discussed their EOL wishes- they would not want to have their life prolonged if they had terminal illness that they would not allow them to return to a functional state.  Joanne Burke expressly states- I want to be able to get up on my [employer] tractor and if the doctor's say I can't then just make me comfortable and let me go on. I clarified with her that "let me go on" means to let her die.  Her husband is in agreement with this.  Joanne Burke and her spouse state that for now they wish to have all medical interventions - including full code status.  ROS  Length of Stay: 16  Current Medications: Scheduled Meds:  . acetaminophen  1,000 mg Oral Q8H  . Chlorhexidine Gluconate Cloth  6 each Topical Daily  . feeding  supplement (ENSURE ENLIVE)  237 mL Oral TID BM  . methocarbamol  500 mg Oral TID  . multivitamin with minerals  1 tablet Oral Daily  . pantoprazole  40 mg Oral Daily  . saccharomyces boulardii  250 mg Oral BID    Continuous Infusions: . sodium chloride 500 mL (05/07/19 0650)  . feeding supplement (VITAL AF 1.2 CAL) Stopped (05/11/19 0800)    PRN Meds: sodium chloride, alum & mag hydroxide-simeth, dextrose, ipratropium-albuterol, lip balm, morphine  injection, traMADol, traZODone  Physical Exam          Vital Signs: BP (!) 121/54 (BP Location: Right Arm)   Pulse 83   Temp 98.4 F (36.9 C) (Oral)   Resp 18   Ht _0  (1.575 m)   Wt 82 kg   SpO2 97%   BMI 33.06 kg/m  SpO2: SpO2: 97 % O2 Device: O2 Device: Room Air O2 Flow Rate: O2 Flow Rate (L/min): 2 L/min  Intake/output summary:   Intake/Output Summary (Last 24 hours) at 05/11/2019 1543 Last data filed at 05/11/2019 1539 Gross per 24 hour  Intake 150 ml  Output 300 ml  Net -150 ml   LBM: Last BM Date: 05/10/19 Baseline Weight: Weight: 40.8 kg Most recent weight: Weight: 82 kg       Palliative Assessment/Data: PPS: 60%    Flowsheet Rows     Most Recent Value  Intake Tab  Referral Department  Hospitalist  Unit at Time of Referral  Med/Surg Unit  Palliative Care Primary Diagnosis  Sepsis/Infectious Disease  Date Notified  05/05/19  Palliative Care Type  New Palliative care  Reason for referral  Clarify Goals of Care  Date of Admission  04/25/19  Date first seen by Palliative Care  05/05/19  # of days Palliative referral response time  0 Day(s)  # of days IP prior to Palliative referral  10  Clinical Assessment  Psychosocial & Spiritual Assessment  Palliative Care Outcomes      Patient Active Problem List   Diagnosis Date Noted  . Goals of care, counseling/discussion   . Palliative care by specialist   . Advanced care planning/counseling discussion   . Diverticulitis of colon with perforation  04/25/2019  . Sepsis ([place]) 04/25/2019    Palliative Care Assessment & Plan   Patient Profile: 85 y.o. female  with past medical history of good health- no problems admitted on 04/25/2019 with abdominal pain. Found to pelvic abscesses likely from diverticulitis, sepsis. Underwent surgical resection with placement of colostomy and g-tube. Palliative medicine consulted for [place].    Assessment/Recommendations/Plan   Patient clinically improving  Plan for d/c to CIR  GOC remain full scope full code  PMT will sign off- please feel free to reconsult if needed  Goals of Care and Additional Recommendations:  Limitations on Scope of Treatment: Full Scope Treatment  Code Status:  Full code  Prognosis:   Unable to determine  Discharge Planning:  CIR  Care plan was discussed with patient and spouse.   Thank you for allowing the Palliative Medicine Team to assist in the care of this patient.   Time In: 1345 Time Out: 1430 Total Time 45 mins Prolonged Time Billed no      Greater than 50%  of this time was spent counseling and coordinating care related to the above assessment and plan.  Joanne Burke, AGNP-C Palliative Medicine   Please contact Palliative Medicine Team phone at [phone] for questions and concerns.

## 2019-05-11 NOTE — Progress Notes (Signed)
Patient ID: Joanne Burke, female   DOB: 1931-06-14, 85 y.o.   MRN: [id number]  PROGRESS NOTE    Joanne Burke  WUJ:[id number] DOB: 1931-06-14 DOA: 04/25/2019 PCP: Marden NobleGates, Robert, MD   Brief Narrative:  85 year old female with no significant past medical history presented with abdominal pain.  She was found to have CT evidence of possible diverticulitis, abscess versus fluid in the abdomen along with acute kidney injury, lactic acidosis and leukocytosis.  Surgery was consulted.  Nephrology was also consulted for acute kidney injury.  She underwent exploratory laparotomy with sigmoid colectomy, colostomy and gastrostomy tube placement with mobilization of splenic flexure.  She was treated with intravenous antibiotics.  Assessment & Plan:   Sigmoid diverticulitis with multiple pelvic abscesses -Status post exploratory laparotomy with sigmoid colectomy, colostomy and gastrostomy tube placement on 04/28/2019 -General surgery following.  Wound care as per general surgery recommendations.  Continue G-tube feeding. -Zosyn stopped on 05/07/2019 by general surgery.  Leukocytosis -White cell count elevated but improving, 14.5 today.  Monitor.  -Has remained afebrile currently.  Anemia of chronic disease -Hemoglobin 7.3 today.  Transfuse if hemoglobin is less than 7.  AKI Non-anion gap metabolic acidosis -Resolved.  Nephrology signed off.    Acute respiratory failure with hypoxia -Resolved. -Chest x-ray showed patchy bilateral airspace opacities concerning for pneumonia on 04/27/2019, could be aspiration pneumonia or pulmonary edema.  She is currently on Zosyn. -Currently on room air.  Generalized conditioning -PT recommends SNF placement.  Family not interested in SNF.  PT now recommending CIR.  CIR consulted.   -Palliative Care consulted for goals of care discussion.  Patient remains full code.  Acute metabolic encephalopathy, likely multifactorial -Most likely secondary to narcotic  medication induced in the setting of recent abdominal surgery along with pain/hyponatremia/AKI.  Had received multiple doses of Ativan for agitation -Mental status is much improved.  Monitor.  Avoid sedative medications as much as possible.   DVT prophylaxis: SCDs Code Status: Full Family Communication: None at bedside Disposition Plan: CIR once bed is available  consultants: General surgery/nephrology/palliative care  Procedures:  exploratory laparotomy with sigmoid colectomy, colostomy and gastrostomy tube placement on 04/28/2019  Antimicrobials:  Anti-infectives (From admission, onward)   Start     Dose/Rate Route Frequency Ordered Stop   05/04/19 1400  piperacillin-tazobactam (ZOSYN) IVPB 3.375 g  Status:  Discontinued     3.375 g 12.5 mL/hr over 240 Minutes Intravenous Every 8 hours 05/04/19 1304 05/07/19 1043   04/29/19 1400  piperacillin-tazobactam (ZOSYN) IVPB 2.25 g  Status:  Discontinued     2.25 g 100 mL/hr over 30 Minutes Intravenous Every 8 hours 04/29/19 1101 05/04/19 1304   04/27/19 1000  cefTRIAXone (ROCEPHIN) 2 g in sodium chloride 0.9 % 100 mL IVPB  Status:  Discontinued     2 g 200 mL/hr over 30 Minutes Intravenous Every 24 hours 04/27/19 0859 04/29/19 1101   04/27/19 1000  metroNIDAZOLE (FLAGYL) IVPB 500 mg  Status:  Discontinued     500 mg 100 mL/hr over 60 Minutes Intravenous Every 8 hours 04/27/19 0859 04/29/19 1101   04/26/19 2200  piperacillin-tazobactam (ZOSYN) IVPB 3.375 g  Status:  Discontinued     3.375 g 12.5 mL/hr over 240 Minutes Intravenous Every 12 hours 04/26/19 1253 04/27/19 0858   04/26/19 1800  aztreonam (AZACTAM) 1 g in sodium chloride 0.9 % 100 mL IVPB  Status:  Discontinued     1 g 200 mL/hr over 30 Minutes Intravenous Every 12 hours 04/26/19 0805 04/26/19  1253   04/25/19 2030  ciprofloxacin (CIPRO) IVPB 200 mg  Status:  Discontinued     200 mg 100 mL/hr over 60 Minutes Intravenous Every 12 hours 04/25/19 1109 04/25/19 1640   04/25/19 2030   aztreonam (AZACTAM) 1 g in sodium chloride 0.9 % 100 mL IVPB  Status:  Discontinued     1 g 200 mL/hr over 30 Minutes Intravenous Every 8 hours 04/25/19 1651 04/26/19 0805   04/25/19 1730  aztreonam (AZACTAM) 2 g in sodium chloride 0.9 % 100 mL IVPB  Status:  Discontinued     2 g 200 mL/hr over 30 Minutes Intravenous  Once 04/25/19 1640 04/25/19 1651   04/25/19 1600  metroNIDAZOLE (FLAGYL) IVPB 500 mg  Status:  Discontinued     500 mg 100 mL/hr over 60 Minutes Intravenous Every 8 hours 04/25/19 1109 04/26/19 1253   04/25/19 0730  ciprofloxacin (CIPRO) IVPB 400 mg     400 mg 200 mL/hr over 60 Minutes Intravenous  Once 04/25/19 0729 04/25/19 0921   04/25/19 0730  metroNIDAZOLE (FLAGYL) IVPB 500 mg     500 mg 100 mL/hr over 60 Minutes Intravenous  Once 04/25/19 0729 04/25/19 5916        Subjective: Patient seen and examined at bedside.  Poor historian.  Denies any worsening abdominal pain, nausea, vomiting or fever.  Oral intake is improving. Objective: Vitals:   05/10/19 2102 05/11/19 0449 05/11/19 0600 05/11/19 0800  BP: 128/72  129/65 125/63  Pulse: 86  79 72  Resp: 18  18 14   Temp: 98.8 F (37.1 C)  98.3 F (36.8 C) 98.6 F (37 C)  TempSrc: Oral  Oral Oral  SpO2: 99%  95% 95%  Weight:  82 kg    Height:        Intake/Output Summary (Last 24 hours) at 05/11/2019 1045 Last data filed at 05/11/2019 0100 Gross per 24 hour  Intake 390 ml  Output -  Net 390 ml   Filed Weights   05/09/19 0425 05/10/19 0450 05/11/19 0449  Weight: 84.6 kg 82.9 kg 82 kg    Examination:  General exam: No distress.  Elderly female lying in bed.  Poor historian.  Flat affect.   Respiratory system: Bilateral decreased breath sounds at bases, some scattered crackles  cardiovascular system: S1-S2 heard, rate controlled Gastrointestinal system: Abdomen is nondistended, soft and has dressing present. Normal bowel sounds heard.  Colostomy present Extremities: No cyanosis, edema    Data  Reviewed: I have personally reviewed following labs and imaging studies  CBC: Recent Labs  Lab 05/07/19 0312 05/08/19 0155 05/09/19 0314 05/10/19 0314 05/11/19 0255  WBC 23.8* 17.5* 17.5* 14.7* 14.5*  NEUTROABS 21.1* 15.3* 15.1* 12.4* 12.2*  HGB 7.8* 7.2* 7.4* 7.6* 7.3*  HCT 22.6* 22.5* 22.7* 24.0* 22.3*  MCV 91.9 94.1 93.4 94.9 94.1  PLT 672* 733* 709* 683* 593*   Basic Metabolic Panel: Recent Labs  Lab 05/05/19 0233 05/06/19 0223 05/07/19 0312 05/08/19 0155 05/09/19 0314 05/10/19 0314 05/11/19 0255  NA 135 138 137 138 137 138 137  K 3.0* 3.2* 4.0 3.7 4.0 4.2 3.6  CL 100 102 102 104 103 103 101  CO2 23 26 25 25 27 28 26   GLUCOSE 107* 89 117* 119* 108* 79 102*  BUN 15 11 12 14 13 11 10   CREATININE 0.62 0.56 0.61 0.55 0.52 0.60 0.61  CALCIUM 7.4* 7.7* 8.0* 7.8* 8.1* 8.1* 8.0*  MG 1.5* 1.7 1.6* 2.0 1.9 1.9 1.9  PHOS 2.7 2.5  2.4* 2.7  --   --   --    GFR: Estimated Creatinine Clearance: 50.1 mL/min (by C-G formula based on SCr of 0.61 mg/dL). Liver Function Tests: No results for input(s): AST, ALT, ALKPHOS, BILITOT, PROT, ALBUMIN in the last 168 hours. No results for input(s): LIPASE, AMYLASE in the last 168 hours. No results for input(s): AMMONIA in the last 168 hours. Coagulation Profile: No results for input(s): INR, PROTIME in the last 168 hours. Cardiac Enzymes: No results for input(s): CKTOTAL, CKMB, CKMBINDEX, TROPONINI in the last 168 hours. BNP (last 3 results) No results for input(s): PROBNP in the last 8760 hours. HbA1C: No results for input(s): HGBA1C in the last 72 hours. CBG: Recent Labs  Lab 05/10/19 1658 05/10/19 2021 05/11/19 0016 05/11/19 0425 05/11/19 0805  GLUCAP 144* 108* 93 116* 113*   Lipid Profile: No results for input(s): CHOL, HDL, LDLCALC, TRIG, CHOLHDL, LDLDIRECT in the last 72 hours. Thyroid Function Tests: No results for input(s): TSH, T4TOTAL, FREET4, T3FREE, THYROIDAB in the last 72 hours. Anemia Panel: No results for  input(s): VITAMINB12, FOLATE, FERRITIN, TIBC, IRON, RETICCTPCT in the last 72 hours. Sepsis Labs: No results for input(s): PROCALCITON, LATICACIDVEN in the last 168 hours.  No results found for this or any previous visit (from the past 240 hour(s)).       Radiology Studies: No results found.      Scheduled Meds: . acetaminophen  1,000 mg Oral Q8H  . Chlorhexidine Gluconate Cloth  6 each Topical Daily  . feeding supplement (ENSURE ENLIVE)  237 mL Oral TID BM  . methocarbamol  500 mg Oral TID  . multivitamin with minerals  1 tablet Oral Daily  . pantoprazole  40 mg Oral Daily  . saccharomyces boulardii  250 mg Oral BID   Continuous Infusions: . sodium chloride 500 mL (05/07/19 0650)  . feeding supplement (VITAL AF 1.2 CAL) Stopped (05/11/19 0800)          Aline August, MD [facility] 05/11/2019, 10:45 AM

## 2019-05-12 LAB — GLUCOSE, CAPILLARY
Glucose-Capillary: 105 mg/dL — ABNORMAL HIGH (ref 70–99)
Glucose-Capillary: 115 mg/dL — ABNORMAL HIGH (ref 70–99)
Glucose-Capillary: 117 mg/dL — ABNORMAL HIGH (ref 70–99)
Glucose-Capillary: 119 mg/dL — ABNORMAL HIGH (ref 70–99)
Glucose-Capillary: 79 mg/dL (ref 70–99)
Glucose-Capillary: 90 mg/dL (ref 70–99)

## 2019-05-12 LAB — CBC WITH DIFFERENTIAL/PLATELET
Abs Immature Granulocytes: 0.1 10*3/uL — ABNORMAL HIGH (ref 0.00–0.07)
Basophils Absolute: 0 10*3/uL (ref 0.0–0.1)
Basophils Relative: 0 %
Eosinophils Absolute: 0.1 10*3/uL (ref 0.0–0.5)
Eosinophils Relative: 1 %
HCT: 24.1 % — ABNORMAL LOW (ref 36.0–46.0)
Hemoglobin: 7.9 g/dL — ABNORMAL LOW (ref 12.0–15.0)
Immature Granulocytes: 1 %
Lymphocytes Relative: 8 %
Lymphs Abs: 0.9 10*3/uL (ref 0.7–4.0)
MCH: 30.7 pg (ref 26.0–34.0)
MCHC: 32.8 g/dL (ref 30.0–36.0)
MCV: 93.8 fL (ref 80.0–100.0)
Monocytes Absolute: 1 10*3/uL (ref 0.1–1.0)
Monocytes Relative: 8 %
Neutro Abs: 9.6 10*3/uL — ABNORMAL HIGH (ref 1.7–7.7)
Neutrophils Relative %: 82 %
Platelets: 578 10*3/uL — ABNORMAL HIGH (ref 150–400)
RBC: 2.57 MIL/uL — ABNORMAL LOW (ref 3.87–5.11)
RDW: 18.7 % — ABNORMAL HIGH (ref 11.5–15.5)
WBC: 11.8 10*3/uL — ABNORMAL HIGH (ref 4.0–10.5)
nRBC: 0 % (ref 0.0–0.2)

## 2019-05-12 LAB — BASIC METABOLIC PANEL
Anion gap: 12 (ref 5–15)
BUN: 10 mg/dL (ref 8–23)
CO2: 22 mmol/L (ref 22–32)
Calcium: 8 mg/dL — ABNORMAL LOW (ref 8.9–10.3)
Chloride: 103 mmol/L (ref 98–111)
Creatinine, Ser: 0.54 mg/dL (ref 0.44–1.00)
GFR calc Af Amer: >60 mL/min (ref >60)
GFR calc non Af Amer: >60 mL/min (ref >60)
Glucose, Bld: 104 mg/dL — ABNORMAL HIGH (ref 70–99)
Potassium: 3.7 mmol/L (ref 3.5–5.1)
Sodium: 137 mmol/L (ref 135–145)

## 2019-05-12 LAB — MAGNESIUM: Magnesium: 1.9 mg/dL (ref 1.7–2.4)

## 2019-05-12 NOTE — Progress Notes (Signed)
[facility] PROGRESS NOTE  Joanne Burke ZOX:[id number] DOB: 1931-07-19 DOA: 04/25/2019 PCP: Marden NobleGates, Robert, MD  Brief summary   85 year old female with no significant past medical history presented with abdominal pain.  She was found to have CT evidence of possible diverticulitis, abscess versus fluid in the abdomen along with acute kidney injury, lactic acidosis and leukocytosis.  Surgery was consulted.  Nephrology was also consulted for acute kidney injury.  She underwent exploratory laparotomy with sigmoid colectomy, colostomy and gastrostomy tube placement with mobilization of splenic flexure.  She was treated with intravenous antibiotics.   Assessment/Plan:  Sigmoid diverticulitis with multiple pelvic abscesses -Status post exploratory laparotomy with sigmoid colectomy, colostomy and gastrostomy tube placement on 04/28/2019.  -JP drain in place, mild feculent output. Remains afebrile. leukocytosis is improving. General surgery following. Zosyn stopped on 05/07/2019 by general surgery. Wound care as per general surgery recommendations. G-tube is in place. Patient is started on PO diet   Anemia of chronic disease -Hemoglobin 7.9. no s/s of acute bleeding. Transfuse if hemoglobin is less than 7.  AKI. Non-anion gap metabolic acidosis -Resolved.  Nephrology signed off.    Acute respiratory failure with hypoxia. Resolved. -Chest x-ray showed patchy bilateral airspace opacities concerning for pneumonia on 04/27/2019, could be aspiration pneumonia or pulmonary edema.  treated with Zosyn. Blood cultures: NGTD. Currently on room air.  Acute metabolic encephalopathy, likely multifactorial. Resolved  -Most likely secondary to narcotic medication induced in the setting of recent abdominal surgery along with pain/hyponatremia/AKI.  Had received multiple doses of Ativan for agitation -Mental status is much improved.  Monitor.  Avoid sedative medications as much as possible.  Generalized  conditioning -PT recommends CIR consulted.   -Palliative Care consulted for goals of care discussion.  Patient remains full code.  Dispo: CIR. Insurance declined. Awaiting peer to peer  Code Status: full Family Communication: d/w patient, RN (indicate person spoken with, relationship, and if by phone, the number) Disposition Plan: awaiting CIR   Consultants:  surgery  Procedures: exploratory laparotomy with sigmoid colectomy, colostomy and gastrostomy tube placement on 04/28/2019   Antibiotics: Anti-infectives (From admission, onward)   Start     Dose/Rate Route Frequency Ordered Stop   05/04/19 1400  piperacillin-tazobactam (ZOSYN) IVPB 3.375 g  Status:  Discontinued     3.375 g 12.5 mL/hr over 240 Minutes Intravenous Every 8 hours 05/04/19 1304 05/07/19 1043   04/29/19 1400  piperacillin-tazobactam (ZOSYN) IVPB 2.25 g  Status:  Discontinued     2.25 g 100 mL/hr over 30 Minutes Intravenous Every 8 hours 04/29/19 1101 05/04/19 1304   04/27/19 1000  cefTRIAXone (ROCEPHIN) 2 g in sodium chloride 0.9 % 100 mL IVPB  Status:  Discontinued     2 g 200 mL/hr over 30 Minutes Intravenous Every 24 hours 04/27/19 0859 04/29/19 1101   04/27/19 1000  metroNIDAZOLE (FLAGYL) IVPB 500 mg  Status:  Discontinued     500 mg 100 mL/hr over 60 Minutes Intravenous Every 8 hours 04/27/19 0859 04/29/19 1101   04/26/19 2200  piperacillin-tazobactam (ZOSYN) IVPB 3.375 g  Status:  Discontinued     3.375 g 12.5 mL/hr over 240 Minutes Intravenous Every 12 hours 04/26/19 1253 04/27/19 0858   04/26/19 1800  aztreonam (AZACTAM) 1 g in sodium chloride 0.9 % 100 mL IVPB  Status:  Discontinued     1 g 200 mL/hr over 30 Minutes Intravenous Every 12 hours 04/26/19 0805 04/26/19 1253   04/25/19 2030  ciprofloxacin (CIPRO) IVPB 200 mg  Status:  Discontinued     200 mg 100 mL/hr over 60 Minutes Intravenous Every 12 hours 04/25/19 1109 04/25/19 1640   04/25/19 2030  aztreonam (AZACTAM) 1 g in sodium chloride 0.9 %  100 mL IVPB  Status:  Discontinued     1 g 200 mL/hr over 30 Minutes Intravenous Every 8 hours 04/25/19 1651 04/26/19 0805   04/25/19 1730  aztreonam (AZACTAM) 2 g in sodium chloride 0.9 % 100 mL IVPB  Status:  Discontinued     2 g 200 mL/hr over 30 Minutes Intravenous  Once 04/25/19 1640 04/25/19 1651   04/25/19 1600  metroNIDAZOLE (FLAGYL) IVPB 500 mg  Status:  Discontinued     500 mg 100 mL/hr over 60 Minutes Intravenous Every 8 hours 04/25/19 1109 04/26/19 1253   04/25/19 0730  ciprofloxacin (CIPRO) IVPB 400 mg     400 mg 200 mL/hr over 60 Minutes Intravenous  Once 04/25/19 0729 04/25/19 0921   04/25/19 0730  metroNIDAZOLE (FLAGYL) IVPB 500 mg     500 mg 100 mL/hr over 60 Minutes Intravenous  Once 04/25/19 0729 04/25/19 0924        (indicate start date, and stop date if known)  HPI/Subjective: Patient is in no acute distress. She reports feeling better. No acute SOB. Poor appetite. No acute abdominal pains   Objective: Vitals:   05/12/19 0417 05/12/19 0419  BP: 140/68   Pulse: 75   Resp: 20   Temp:  98.6 F (37 C)  SpO2: 99%     Intake/Output Summary (Last 24 hours) at 05/12/2019 0926 Last data filed at 05/12/2019 0433 Gross per 24 hour  Intake -  Output 955 ml  Net -955 ml   Filed Weights   05/10/19 0450 05/11/19 0449 05/12/19 0417  Weight: 82.9 kg 82 kg 80.5 kg    Exam:   General:  No distress   Cardiovascular: s1,s2 rrr  Respiratory: no wheezing   Abdomen: soft, + colostomy. NT  Musculoskeletal: no leg edema    Data Reviewed: Basic Metabolic Panel: Recent Labs  Lab 05/06/19 0223 05/07/19 0312 05/08/19 0155 05/09/19 0314 05/10/19 0314 05/11/19 0255 05/12/19 0235  NA 138 137 138 137 138 137 137  K 3.2* 4.0 3.7 4.0 4.2 3.6 3.7  CL 102 102 104 103 103 101 103  CO2 26 25 25 27 28 26 22   GLUCOSE 89 117* 119* 108* 79 102* 104*  BUN 11 12 14 13 11 10 10   CREATININE 0.56 0.61 0.55 0.52 0.60 0.61 0.54  CALCIUM 7.7* 8.0* 7.8* 8.1* 8.1* 8.0*  8.0*  MG 1.7 1.6* 2.0 1.9 1.9 1.9 1.9  PHOS 2.5 2.4* 2.7  --   --   --   --    Liver Function Tests: No results for input(s): AST, ALT, ALKPHOS, BILITOT, PROT, ALBUMIN in the last 168 hours. No results for input(s): LIPASE, AMYLASE in the last 168 hours. No results for input(s): AMMONIA in the last 168 hours. CBC: Recent Labs  Lab 05/08/19 0155 05/09/19 0314 05/10/19 0314 05/11/19 0255 05/12/19 0235  WBC 17.5* 17.5* 14.7* 14.5* 11.8*  NEUTROABS 15.3* 15.1* 12.4* 12.2* 9.6*  HGB 7.2* 7.4* 7.6* 7.3* 7.9*  HCT 22.5* 22.7* 24.0* 22.3* 24.1*  MCV 94.1 93.4 94.9 94.1 93.8  PLT 733* 709* 683* 593* 578*   Cardiac Enzymes: No results for input(s): CKTOTAL, CKMB, CKMBINDEX, TROPONINI in the last 168 hours. BNP (last 3 results) No results for input(s): BNP in the last 8760 hours.  ProBNP (last 3 results)  No results for input(s): PROBNP in the last 8760 hours.  CBG: Recent Labs  Lab 05/11/19 1623 05/11/19 2011 05/12/19 0034 05/12/19 0416 05/12/19 0809  GLUCAP 106* 98 115* 105* 117*    No results found for this or any previous visit (from the past 240 hour(s)).   Studies: No results found.  Scheduled Meds: . acetaminophen  1,000 mg Oral Q8H  . Chlorhexidine Gluconate Cloth  6 each Topical Daily  . feeding supplement (ENSURE ENLIVE)  237 mL Oral TID BM  . methocarbamol  500 mg Oral TID  . multivitamin with minerals  1 tablet Oral Daily  . pantoprazole  40 mg Oral Daily  . saccharomyces boulardii  250 mg Oral BID   Continuous Infusions: . sodium chloride 500 mL (05/07/19 0650)  . feeding supplement (VITAL AF 1.2 CAL) Stopped (05/12/19 0825)    Principal Problem:   Diverticulitis of colon with perforation Active Problems:   Sepsis ([place])   Goals of care, counseling/discussion   Palliative care by specialist   Advanced care planning/counseling discussion    Time spent: >35 minutes     Kinnie Feil  [facility] Pager [phone]. If 7PM-7AM, please  contact night-coverage at [email or web address], password [facility] 05/12/2019, 9:26 AM  LOS: 17 days

## 2019-05-12 NOTE — Progress Notes (Signed)
Nutrition Follow-up  DOCUMENTATION CODES:   Obesity unspecified  INTERVENTION:   -Continue MVI with minerals daily -Continue Ensure Enlive po TID, each supplement provides 350 kcal and 20 grams of protein -Continue Magic cup TID with meals, each supplement provides 290 kcal and 9 grams of protein -Continue  nocturnal feedings: Vital 1.2 @ 60 ml/hr over 12 hour period (2000-0800)- regimen to provide 864 kcals, 54 grams protein, and 584 ml free water daily, meeting 56% of estimated kcal needs and 64% of estimated protein needs  NUTRITION DIAGNOSIS:   Increased nutrient needs related to post-op healing as evidenced by estimated needs.  Ongoing  GOAL:   Patient will meet greater than or equal to 90% of their needs  Progressing   MONITOR:   PO intake, Supplement acceptance, Labs, Weight trends, TF tolerance, Skin, I & O's  REASON FOR ASSESSMENT:   Consult Calorie Count  ASSESSMENT:   Joanne Burke is a 85 y.o. female with no significant medical history presenting with abdominal pain.   She had abdominal pain in the summer and it resolved; she had swelling at the time.  About 9 days ago, she started with LLQ pain with n/v, decreased PO.  She has not been to the doctor in years but the pain was finally so bad that she had to come.  No fevers.  9/30- s/pProcedure: Exporter laparotomy with sigmoid colectomy, colostomy and Stamm gastrostomy tube placement Mobilization of splenic flexure 10/4- trickle TF started 10/5- advanced to clear liquids 10/6- advanced to full liquids 10/8- advanced to soft diet 10/9- transitioned to nocturnal feedings  Reviewed I/O's: -955 ml x 24 hours and +3.1 L since 04/28/19  YOP: 550 ml x 24 hours  Drains: 5 ml x 24 hours  Colostomy output: 400 ml x 24 hours  Spoke with pt at bedside, who was much more alert and interactive compared to previous visits. Pt reports feeling better and that "my appetite is getting better everyday". Per her report,  she consumed 100% of french toast and a few bites of bacon and eggs today. Pt is consuming her Ensure supplements, but usually only consumes half of less of supplement.   Pt remains on nocturnal TF to help supplement intake. Pt denies any nausea, vomiting or abdominal pain with TF. Discussed importance of good meal and supplement intake to promote healing, as well as rationale for nocturnal feedings. Pt understanding and appreciative of care.   Per MD notes, hopeful plan to discharge to CIR once medically stable.   Labs reviewed: CBGS: 105-117.   Diet Order:   Diet Order            Diet regular Room service appropriate? No; Fluid consistency: Thin  Diet effective now              EDUCATION NEEDS:   Education needs have been addressed  Skin:  Skin Assessment: Skin Integrity Issues: Skin Integrity Issues:: Incisions Incisions: closed abdomen  Last BM:  05/12/19 (100 ml via colostomy)  Height:   Ht Readings from Last 1 Encounters:  04/25/19 5\' 2"  (1.575 m)    Weight:   Wt Readings from Last 1 Encounters:  05/12/19 80.5 kg    Ideal Body Weight:  50 kg  BMI:  Body mass index is 32.46 kg/m.  Estimated Nutritional Needs:   Kcal:  1550-1750  Protein:  85-100 grams  Fluid:  > 1.5 L    Joanne Burke A. Jimmye Norman, RD, LDN, [place] Registered Dietitian II Certified Diabetes Care and  Education Specialist Pager: [phone] After hours Pager: [phone]

## 2019-05-12 NOTE — Consult Note (Signed)
[place] Nurse ostomy follow up Stoma type/location: RLQ end ileostomy Spouse present and wants to participate in care.  Patient is able to open and close to empty and clean lip of pouch.   Stomal assessment/size: 2 " pink and moist Peristomal assessment: intact  I did use a barrier ring today as I noticed some wrinkling in skin near umbilicus and wanted a flat pouching surface. Explained rationale for barrier ring to spouse and patient.    Treatment options for stomal/peristomal skin: barrier ring and 1piece convex pouch Output liquid brown stool Ostomy pouching: 1pc.convex with barrier ring Education provided: Pouch change performed.  Spouse cut the barrier out independently.  Observed me applying the pouch. He is unable to walk and moving around the bed was difficult.  He anticipates being able to complete pouch change without difficulty. Informed him that he would be sent home with pouches for his wife and that she is enrolled in secure start program. Discussed twice weekly pouch changes and showering.  Enrolled patient in [place] Start Discharge program: Yes Will follow.  Domenic Moras MSN, RN, FNP-BC [facility] Wound, Ostomy, Continence Nurse Pager [phone]

## 2019-05-12 NOTE — Discharge Instructions (Signed)
[facility], [place] [phone]  OPEN ABDOMINAL SURGERY: POST OP INSTRUCTIONS  Always review your discharge instruction sheet given to you by the facility where your surgery was performed.  IF YOU HAVE DISABILITY OR FAMILY LEAVE FORMS, YOU MUST BRING THEM TO THE OFFICE FOR PROCESSING.  PLEASE DO NOT GIVE THEM TO YOUR DOCTOR.  1. A prescription for pain medication may be given to you upon discharge.  Take your pain medication as prescribed, if needed.  If narcotic pain medicine is not needed, then you may take acetaminophen (Tylenol) or ibuprofen (Advil) as needed. 2. Take your usually prescribed medications unless otherwise directed. 3. If you need a refill on your pain medication, please contact your pharmacy. They will contact our office to request authorization.  Prescriptions will not be filled after 5pm or on week-ends. 4. You should follow a light diet the first few days after arrival home, such as soup and crackers, pudding, etc.unless your doctor has advised otherwise. A high-fiber, low fat diet can be resumed as tolerated.   Be sure to include lots of fluids daily. Most patients will experience some swelling and bruising on the chest and neck area.  Ice packs will help.  Swelling and bruising can take several days to resolve 5. Most patients will experience some swelling and bruising in the area of the incision. Ice pack will help. Swelling and bruising can take several days to resolve..  6. It is common to experience some constipation if taking pain medication after surgery.  Increasing fluid intake and taking a stool softener will usually help or prevent this problem from occurring.  A mild laxative (Milk of Magnesia or Miralax) should be taken according to package directions if there are no bowel movements after 48 hours. 7.  You may have steri-strips (small skin tapes) in place directly over the incision.  These strips should be left on the skin for 7-10 days.  If your  surgeon used skin glue on the incision, you may shower in 24 hours.  The glue will flake off over the next 2-3 weeks.  Any sutures or staples will be removed at the office during your follow-up visit. You may find that a light gauze bandage over your incision may keep your staples from being rubbed or pulled. You may shower and replace the bandage daily. 8. ACTIVITIES:  You may resume regular (light) daily activities beginning the next day--such as daily self-care, walking, climbing stairs--gradually increasing activities as tolerated.  You may have sexual intercourse when it is comfortable.  Refrain from any heavy lifting or straining until approved by your doctor. a. You may drive when you no longer are taking prescription pain medication, you can comfortably wear a seatbelt, and you can safely maneuver your car and apply brakes  9. You should see your doctor in the office for a follow-up appointment approximately two weeks after your surgery.  Make sure that you call for this appointment within a day or two after you arrive home to insure a convenient appointment time.  WHEN TO CALL YOUR DOCTOR: 1. Fever over 101.0 2. Inability to urinate 3. Nausea and/or vomiting 4. Extreme swelling or bruising 5. Continued bleeding from incision. 6. Increased pain, redness, or drainage from the incision. 7. Difficulty swallowing or breathing 8. Muscle cramping or spasms. 9. Numbness or tingling in hands or feet or around lips.  The clinic staff is available to answer your questions during regular business hours.  Please  dont hesitate to call and ask to speak to one of the nurses if you have concerns.  For further questions, please visit [email or web address]   Colostomy Home Guide, Adult  Colostomy surgery is done to create an opening in the front of the abdomen for stool (feces) to leave the body through an ostomy (stoma). Part of the large intestine is attached to the stoma. A bag, also  called a pouch, is fitted over the stoma. Stool and gas will collect in the bag. After surgery, you will need to empty and change your colostomy bag as needed. You will also need to care for your stoma. How to care for the stoma Your stoma should look pink, red, and moist, like the inside of your cheek. Soon after surgery, the stoma may be swollen, but this swelling will go away within 6 weeks. To care for the stoma:  Keep the skin around the stoma clean and dry.  Use a clean, soft washcloth to gently wash the stoma and the skin around it. Clean using a circular motion, and wipe away from the stoma opening, not toward it. ? Use warm water and only use cleansers recommended by your health care provider. ? Rinse the stoma area with plain water. ? Dry the area around the stoma well.  Use stoma powder or ointment on your skin only as told by your health care provider. Do not use any other powders, gels, wipes, or creams on the skin around the stoma.  Check the stoma area every day for signs of infection. Check for: ? New or worsening redness, swelling, or pain. ? New or increased fluid or blood. ? Pus or warmth.  Measure the stoma opening regularly and record the size. Watch for changes. (It is normal for the stoma to get smaller as swelling goes away.) Share this information with your health care provider. How to empty the colostomy bag  Empty your bag at bedtime and whenever it is one-third to one-half full. Do not let the bag get more than half-full with stool or gas. The bag could leak if it gets too full. Some colostomy bags have a built-in gas release valve that releases gas often throughout the day. Follow these basic steps: 1. Wash your hands with soap and water. 2. Sit far back on the toilet seat. 3. Put several pieces of toilet paper into the toilet water. This will prevent splashing as you empty stool into the toilet. 4. Remove the clip or the hook-and-loop fastener from the tail  end of the bag. 5. Unroll the tail, then empty the stool into the toilet. 6. Clean the tail with toilet paper or a moist towelette. 7. Reroll the tail, and close it with the clip or the hook-and-loop fastener. 8. Wash your hands again. How to change the colostomy bag Change your bag every 3-4 days or as often as told by your health care provider. Also change the bag if it is leaking or separating from the skin, or if your skin around the stoma looks or feels irritated. Irritated skin may be a sign that the bag is leaking. Always have colostomy supplies with you, and follow these basic steps: 1. Wash your hands with soap and water. Have paper towels or tissues nearby to clean any discharge. 2. Remove the old bag and skin barrier. Use your fingers or a warm cloth to gently push the skin away from the barrier. 3. Clean the stoma area with water or with mild soap and  water, as directed. Use water to rinse away any soap. 4. Dry the skin. You may use the cool setting on a hair dryer to do this. 5. Use a tracing pattern (template) to cut the skin barrier to the size needed. 6. If you are using a two-piece bag, attach the bag and the skin barrier to each other. Add the barrier ring, if you use one. 7. If directed, apply stoma powder or skin barrier gel to the skin. 8. Warm the skin barrier with your hands, or blow with a hair dryer for 5-10 seconds. 9. Remove the paper from the adhesive strip of the skin barrier. 10. Press the adhesive strip onto the skin around the stoma. 11. Gently rub the skin barrier onto the skin. This creates heat that helps the barrier to stick. 12. Apply stoma tape to the edges of the skin barrier, if desired. 13. Wash your hands again. General recommendations  Avoid wearing tight clothes or having anything press directly on your stoma or bag. Change your clothing whenever it is soiled or damp.  You may shower or bathe with the bag on or off. Do not use harsh or oily soaps  or lotions. Dry the skin and bag after bathing.  Store all supplies in a cool, dry place. Do not leave supplies in extreme heat because some parts can melt or not stick as well.  Whenever you leave home, take extra clothing and an extra skin barrier and bag with you.  If your bag gets wet, you can dry it with a hair dryer on the cool setting.  To prevent odor, you may put drops of ostomy deodorizer in the bag.  If recommended by your health care provider, put ostomy lubricant inside the bag. This helps stool to slide out of the bag more easily and completely. Contact a health care provider if:  You have new or worsening redness, swelling, or pain around your stoma.  You have new or increased fluid or blood coming from your stoma.  Your stoma feels warm to the touch.  You have pus coming from your stoma.  Your stoma extends in or out farther than normal.  You need to change your bag every day.  You have a fever. Get help right away if:  Your stool is bloody.  You have nausea or you vomit.  You have trouble breathing. Summary  Measure your stoma opening regularly and record the size. Watch for changes.  Empty your bag at bedtime and whenever it is one-third to one-half full. Do not let the bag get more than half-full with stool or gas.  Change your bag every 3-4 days or as often as told by your health care provider.  Whenever you leave home, take extra clothing and an extra skin barrier and bag with you. This information is not intended to replace advice given to you by your health care provider. Make sure you discuss any questions you have with your health care provider. Document Released: 07/18/2003 Document Revised: 11/04/2018 Document Reviewed: 01/08/2017 Elsevier Patient Education  2020 [employer]   [facility] Surgical drains are used to remove extra fluid that normally builds up in a surgical wound after surgery. A surgical drain helps to heal a  surgical wound. Different kinds of surgical drains include:  Active drains. These drains use suction to pull drainage away from the surgical wound. Drainage flows through a tube to a container outside of the body. With these drains, you need  to keep the bulb or the drainage container flat (compressed) at all times, except while you empty it. Flattening the bulb or container creates suction.  Passive drains. These drains allow fluid to drain naturally, by gravity. Drainage flows through a tube to a bandage (dressing) or a container outside of the body. Passive drains do not need to be emptied. A drain is placed during surgery. Right after surgery, drainage is usually bright red and a little thicker than water. The drainage may gradually turn yellow or pink and become thinner. It is likely that your health care provider will remove the drain when the drainage stops or when the amount decreases to 1-2 Tbsp (15-30 mL) during a 24-hour period. Supplies needed:  Tape.  Germ-free cleaning solution (sterile saline).  Cotton swabs.  Split gauze drain sponge: 4 x 4 inches (10 x 10 cm).  Gauze square: 4 x 4 inches (10 x 10 cm). How to care for your surgical drain Care for your drain as told by your health care provider. This is important to help prevent infection. If your drain is placed at your back, or any other hard-to-reach area, ask another person to assist you in performing the following tasks: General care  Keep the skin around the drain dry and covered with a dressing at all times.  Check your drain area every day for signs of infection. Check for: ? Redness, swelling, or pain. ? Pus or a bad smell. ? Cloudy drainage. ? Tenderness or pressure at the drain exit site. Changing the dressing Follow instructions from your health care provider about how to change your dressing. Change your dressing at least once a day. Change it more often if needed to keep the dressing dry. Make sure  you: 1. Gather your supplies. 2. Wash your hands with soap and water before you change your dressing. If soap and water are not available, use hand sanitizer. 3. Remove the old dressing. Avoid using scissors to do that. 4. Wash your hands with soap and water again after removing the old dressing. 5. Use sterile saline to clean your skin around the drain. You may need to use a cotton swab to clean the skin. 6. Place the tube through the slit in a drain sponge. Place the drain sponge so that it covers your wound. 7. Place the gauze square or another drain sponge on top of the drain sponge that is on the wound. Make sure the tube is between those layers. 8. Tape the dressing to your skin. 9. Tape the drainage tube to your skin 1-2 inches (2.5-5 cm) below the place where the tube enters your body. Taping keeps the tube from pulling on any stitches (sutures) that you have. 10. Wash your hands with soap and water. 11. Write down the color of your drainage and how often you change your dressing. How to empty your active drain  1. Make sure that you have a measuring cup that you can empty your drainage into. 2. Wash your hands with soap and water. If soap and water are not available, use hand sanitizer. 3. Loosen any pins or clips that hold the tube in place. 4. If your health care provider tells you to strip the tube to prevent clots and tube blockages: ? Hold the tube at the skin with one hand. Use your other hand to pinch the tubing with your thumb and first finger. ? Gently move your fingers down the tube while squeezing very lightly. This clears any drainage,  clots, or tissue from the tube. ? You may need to do this several times each day to keep the tube clear. Do not pull on the tube. 5. Open the bulb cap or the drain plug. Do not touch the inside of the cap or the bottom of the plug. 6. Turn the device upside down and gently squeeze. 7. Empty all of the drainage into the measuring  cup. 8. Compress the bulb or the container and replace the cap or the plug. To compress the bulb or the container, squeeze it firmly in the middle while you close the cap or plug the container. 9. Write down the amount of drainage that you have in each 24-hour period. If you have less than 2 Tbsp (30 mL) of drainage during 24 hours, contact your health care provider. 10. Flush the drainage down the toilet. 11. Wash your hands with soap and water. Contact a health care provider if:  You have redness, swelling, or pain around your drain area.  You have pus or a bad smell coming from your drain area.  You have a fever or chills.  The skin around your drain is warm to the touch.  The amount of drainage that you have is increasing instead of decreasing.  You have drainage that is cloudy.  There is a sudden stop or a sudden decrease in the amount of drainage that you have.  Your drain tube falls out.  Your active drain does not stay compressed after you empty it. Summary  Surgical drains are used to remove extra fluid that normally builds up in a surgical wound after surgery.  Different kinds of surgical drains include active drains and passive drains. Active drains use suction to pull drainage away from the surgical wound, and passive drains allow fluid to drain naturally.  It is important to care for your drain to prevent infection. If your drain is placed at your back, or any other hard-to-reach area, ask another person to assist you.  Contact your health care provider if you have redness, swelling, or pain around your drain area. This information is not intended to replace advice given to you by your health care provider. Make sure you discuss any questions you have with your health care provider. Document Released: 07/12/2000 Document Revised: 08/19/2018 Document Reviewed: 08/19/2018 Elsevier Patient Education  2020 [employer].

## 2019-05-12 NOTE — Progress Notes (Signed)
Physical Therapy Treatment Patient Details Name: Joanne Burke MRN: [id number] DOB: 1930/09/27 Today's Date: 05/12/2019    History of Present Illness 85 y.o. female with no significant medical history presenting with abdominal pain. Pt admitted for diverticulitis of colon with perforation, sepsis. On 04/28/19 she underwent sigmoid colectomy, colostomy and Stamm gastrostomy tube placement.    PT Comments    Patient received in bed, husband present. Very supportive. Patient performs bed mobility with supervision. Transfers to [facility] and sit to stand with min guard assist. She was able to walk 150 feet this day with RW and min guard assist. Occasional brief standing rest breaks needed due to fatigue. Patient cued to pace herself, rest when needed when walking. She is making good progress with mobility and is motivated to return home.  Patient will continue to benefit from skilled PT while here to improve activity tolerance, strength and independence.        Follow Up Recommendations  Home health PT;Supervision for mobility/OOB     Equipment Recommendations  Rolling walker with 5" wheels    Recommendations for Other Services       Precautions / Restrictions Precautions Precautions: Fall Precaution Comments: bulb drain, colostomy, G tube Restrictions Weight Bearing Restrictions: No    Mobility  Bed Mobility Overal bed mobility: Needs Assistance Bed Mobility: Supine to Sit;Sit to Supine     Supine to sit: Supervision Sit to supine: Supervision      Transfers Overall transfer level: Needs assistance Equipment used: Rolling walker (2 wheeled) Transfers: Sit to/from Stand Sit to Stand: Min guard Stand pivot transfers: Min guard       General transfer comment: min guard for transfer to [facility] and back to bed.  Ambulation/Gait Ambulation/Gait assistance: Min guard Gait Distance (Feet): 150 Feet Assistive device: Rolling walker (2 wheeled) Gait Pattern/deviations: Step-through  pattern;Trunk flexed Gait velocity: decreased   General Gait Details: Requires cues for pacing, take her time, rest when needed. Patient leans on RW when fatigued.   Stairs             Wheelchair Mobility    Modified Rankin (Stroke Patients Only)       Balance Overall balance assessment: Needs assistance Sitting-balance support: Feet supported Sitting balance-Leahy Scale: Good     Standing balance support: Bilateral upper extremity supported;During functional activity Standing balance-Leahy Scale: Good Standing balance comment: reliant on external support when standing                            Cognition Arousal/Alertness: Awake/alert Behavior During Therapy: WFL for tasks assessed/performed Overall Cognitive Status: Within Functional Limits for tasks assessed                                        Exercises Other Exercises Other Exercises: supine BLE exercises: SLR, heel slides, hip abd/add x 10 reps each    General Comments        Pertinent Vitals/Pain Pain Assessment: Faces Faces Pain Scale: Hurts a little bit Pain Location: abdomen Pain Descriptors / Indicators: Grimacing;Guarding;Discomfort Pain Intervention(s): Limited activity within patient's tolerance;Monitored during session    Home Living                      Prior Function            PT Goals (current goals can now  be found in the care plan section) Acute Rehab PT Goals Patient Stated Goal: to go to CIR, or home with HHPT. She does not want to go to SNF PT Goal Formulation: With patient/family Time For Goal Achievement: 05/17/19 Potential to Achieve Goals: Good Progress towards PT goals: Progressing toward goals    Frequency    Min 3X/week      PT Plan Discharge plan needs to be updated    Co-evaluation              AM-PAC PT "6 Clicks" Mobility   Outcome Measure  Help needed turning from your back to your side while in a flat bed  without using bedrails?: A Little Help needed moving from lying on your back to sitting on the side of a flat bed without using bedrails?: A Little Help needed moving to and from a bed to a chair (including a wheelchair)?: A Little Help needed standing up from a chair using your arms (e.g., wheelchair or bedside chair)?: A Little Help needed to walk in hospital room?: A Little Help needed climbing 3-5 steps with a railing? : A Lot 6 Click Score: 17    End of Session Equipment Utilized During Treatment: Gait belt Activity Tolerance: Patient tolerated treatment well Patient left: in bed;with call bell/phone within reach;with family/visitor present Nurse Communication: Mobility status PT Visit Diagnosis: Muscle weakness (generalized) (M62.81);Difficulty in walking, not elsewhere classified (R26.2);Pain Pain - Right/Left: (abdomen)     Time: 1694-5038 PT Time Calculation (min) (ACUTE ONLY): 32 min  Charges:  $Gait Training: 8-22 mins $Therapeutic Exercise: 8-22 mins                     Conita Amenta, PT, GCS 05/12/19,3:19 PM

## 2019-05-12 NOTE — Progress Notes (Addendum)
Inpatient Rehab Admissions Coordinator:   Notified by Bernadene Person that pt's request for CIR authorization was denied.  Dr. Daleen Bo agreeable to complete peer to peer to attempt to overturn denial and I have called to schedule with [employer].    Addendum: Met with pt and husband at bedside to update on insurance authorization pending.   Shann Medal, PT, DPT Admissions Coordinator [phone] 05/12/19  9:40 AM

## 2019-05-12 NOTE — Progress Notes (Signed)
[facility] Progress Note  14 Days Post-Op  Subjective: CC: no complaints Patient reports some mild lower abdominal pain occasionally, but none at the moment. Denies nausea. Tolerating diet and cyclic TF.   Objective: Vital signs in last 24 hours: Temp:  [98.4 F (36.9 C)-98.6 F (37 C)] 98.6 F (37 C) (10/14 0419) Pulse Rate:  [75-83] 75 (10/14 0417) Resp:  [17-20] 20 (10/14 0417) BP: (121-152)/(54-78) 140/68 (10/14 0417) SpO2:  [96 %-99 %] 99 % (10/14 0417) Weight:  [80.5 kg] 80.5 kg (10/14 0417) Last BM Date: 05/10/19  Intake/Output from previous day: 10/13 0701 - 10/14 0700 In: -  Out: 955 [Urine:550; Drains:5; Stool:400] Intake/Output this shift: No intake/output data recorded.  PE: Gen:  Alert, NAD, pleasant Card:  Regular rate and rhythm Pulm:  Normal effort, clear to auscultation bilaterally Abd: Soft, non-tender, non-distended, +BS, midline wound with granulation tissue and some fibrinous tissue but overall clean and shallow, stoma working with soft brown stool in ostomy bag, drain in LLQ with feculent appearing drainage, G-tube present and intact Skin: warm and dry, no rashes  Psych: A&Ox3   Lab Results:  Recent Labs    05/11/19 0255 05/12/19 0235  WBC 14.5* 11.8*  HGB 7.3* 7.9*  HCT 22.3* 24.1*  PLT 593* 578*   BMET Recent Labs    05/11/19 0255 05/12/19 0235  NA 137 137  K 3.6 3.7  CL 101 103  CO2 26 22  GLUCOSE 102* 104*  BUN 10 10  CREATININE 0.61 0.54  CALCIUM 8.0* 8.0*   PT/INR No results for input(s): LABPROT, INR in the last 72 hours. CMP     Component Value Date/Time   NA 137 05/12/2019 0235   K 3.7 05/12/2019 0235   CL 103 05/12/2019 0235   CO2 22 05/12/2019 0235   GLUCOSE 104 (H) 05/12/2019 0235   BUN 10 05/12/2019 0235   CREATININE 0.54 05/12/2019 0235   CALCIUM 8.0 (L) 05/12/2019 0235   PROT 6.1 (L) 04/25/2019 0355   ALBUMIN 1.6 (L) 05/04/2019 0210   AST 46 (H) 04/25/2019 0355   ALT 33 04/25/2019 0355   ALKPHOS 237 (H) 04/25/2019 0355   BILITOT 1.7 (H) 04/25/2019 0355   GFRNONAA >60 05/12/2019 0235   GFRAA >60 05/12/2019 0235   Lipase     Component Value Date/Time   LIPASE 15 04/25/2019 0355       Studies/Results: No results found.  Anti-infectives: Anti-infectives (From admission, onward)   Start     Dose/Rate Route Frequency Ordered Stop   05/04/19 1400  piperacillin-tazobactam (ZOSYN) IVPB 3.375 g  Status:  Discontinued     3.375 g 12.5 mL/hr over 240 Minutes Intravenous Every 8 hours 05/04/19 1304 05/07/19 1043   04/29/19 1400  piperacillin-tazobactam (ZOSYN) IVPB 2.25 g  Status:  Discontinued     2.25 g 100 mL/hr over 30 Minutes Intravenous Every 8 hours 04/29/19 1101 05/04/19 1304   04/27/19 1000  cefTRIAXone (ROCEPHIN) 2 g in sodium chloride 0.9 % 100 mL IVPB  Status:  Discontinued     2 g 200 mL/hr over 30 Minutes Intravenous Every 24 hours 04/27/19 0859 04/29/19 1101   04/27/19 1000  metroNIDAZOLE (FLAGYL) IVPB 500 mg  Status:  Discontinued     500 mg 100 mL/hr over 60 Minutes Intravenous Every 8 hours 04/27/19 0859 04/29/19 1101   04/26/19 2200  piperacillin-tazobactam (ZOSYN) IVPB 3.375 g  Status:  Discontinued     3.375 g 12.5 mL/hr over 240 Minutes Intravenous Every  12 hours 04/26/19 1253 04/27/19 0858   04/26/19 1800  aztreonam (AZACTAM) 1 g in sodium chloride 0.9 % 100 mL IVPB  Status:  Discontinued     1 g 200 mL/hr over 30 Minutes Intravenous Every 12 hours 04/26/19 0805 04/26/19 1253   04/25/19 2030  ciprofloxacin (CIPRO) IVPB 200 mg  Status:  Discontinued     200 mg 100 mL/hr over 60 Minutes Intravenous Every 12 hours 04/25/19 1109 04/25/19 1640   04/25/19 2030  aztreonam (AZACTAM) 1 g in sodium chloride 0.9 % 100 mL IVPB  Status:  Discontinued     1 g 200 mL/hr over 30 Minutes Intravenous Every 8 hours 04/25/19 1651 04/26/19 0805   04/25/19 1730  aztreonam (AZACTAM) 2 g in sodium chloride 0.9 % 100 mL IVPB  Status:  Discontinued     2 g 200 mL/hr  over 30 Minutes Intravenous  Once 04/25/19 1640 04/25/19 1651   04/25/19 1600  metroNIDAZOLE (FLAGYL) IVPB 500 mg  Status:  Discontinued     500 mg 100 mL/hr over 60 Minutes Intravenous Every 8 hours 04/25/19 1109 04/26/19 1253   04/25/19 0730  ciprofloxacin (CIPRO) IVPB 400 mg     400 mg 200 mL/hr over 60 Minutes Intravenous  Once 04/25/19 0729 04/25/19 0921   04/25/19 0730  metroNIDAZOLE (FLAGYL) IVPB 500 mg     500 mg 100 mL/hr over 60 Minutes Intravenous  Once 04/25/19 0729 04/25/19 0932       Assessment/Plan Sepsis Encephalopathy AKI-Improved;renalhassignedoff. ABL anemia -currently 7.6. Transfusion decision per primary team Malnutrition-prealbumin20.9 HxConstipation Tobacco abuse   POD 14, s/p Exploratory laparotomywith sigmoid colectomy, colostomy, and Stamm gastrostomy tube placement, Dr. Brantley Stage 9/30 for expected severe diverticulitis with multiple pelvic abscesses  -Pathology with diverticulitis -Stoma viable - BID wet to dry dressing changes.  -nightly TFs to assist with nutritional support - JP drain appears feculent.  If this is the case, it would be concerning for a rectal stump leaking; however, as long as it is controlled through this drain, she is diverted proximal to this and likely would not need anything further unless she had symptoms of undrained collection. -WBC 11 from 14 yesterday, no fevers  ID -no current abx VTE -SCDs,  FEN -regulardiet/tube feeding-cyclic in p.mto try to increase appetite during the day Foley -foleyout Follow up -Dr. Brantley Stage  Dispo - possibly CIR  LOS: 17 days    Brigid Re , [facility] 05/12/2019, 9:55 AM Pager: [phone] Consults: [phone]

## 2019-05-13 LAB — GLUCOSE, CAPILLARY
Glucose-Capillary: 95 mg/dL (ref 70–99)
Glucose-Capillary: 106 mg/dL — ABNORMAL HIGH (ref 70–99)
Glucose-Capillary: 93 mg/dL (ref 70–99)
Glucose-Capillary: 106 mg/dL — ABNORMAL HIGH (ref 70–99)
Glucose-Capillary: 93 mg/dL (ref 70–99)
Glucose-Capillary: 103 mg/dL — ABNORMAL HIGH (ref 70–99)

## 2019-05-13 NOTE — TOC Progression Note (Signed)
Transition of Care [facility]) - Progression Note    Patient Details  Name: Joanne Burke MRN: [id number] Date of Birth: 1930-10-04  Transition of Care [facility]) CM/SW [place], LCSW Phone Number: 05/13/2019, 9:52 AM  Clinical Narrative:    CSW continuing to follow for disposition needs.   Expected Discharge Plan: [place] Barriers to Discharge: Continued Medical Work up  Expected Discharge Plan and Services Expected Discharge Plan: [place]   Discharge Planning Services: CM Consult   Living arrangements for the past 2 months: Single Family Home                 DME Arranged: N/A DME Agency: NA       HH Arranged: NA HH Agency: [facility] Date [place]: 05/06/19 Time [place]: [id number] Representative spoke with at [place]: [place]) Interventions    Readmission Risk Interventions No flowsheet data found.

## 2019-05-13 NOTE — Progress Notes (Signed)
[facility] PROGRESS NOTE  Joanne Burke QTM:[id number] DOB: 1930/11/10 DOA: 04/25/2019 PCP: Marden Noble, MD  Brief summary   85 year old female with no significant past medical history presented with abdominal pain.  She was found to have CT evidence of possible diverticulitis, abscess versus fluid in the abdomen along with acute kidney injury, lactic acidosis and leukocytosis.  Surgery was consulted. Nephrology was also consulted for acute kidney injury.  She underwent exploratory laparotomy with sigmoid colectomy, colostomy and gastrostomy tube placement with mobilization of splenic flexure.  She was treated with intravenous antibiotics.   Assessment/Plan:  Sigmoid diverticulitis with multiple pelvic abscesses -Status post exploratory laparotomy with sigmoid colectomy, colostomy and gastrostomy tube placement on 04/28/2019.  -JP drain in place, persistent mild feculent output. Per surgery: possible rectal stump leak. Patient is diverted. No fevers. leukocytosis is improving. General surgery following. I have called and d/w surgery who dDid not recommend further imagine at this time. Recommended to cont JP drain and f/u with Dr. Davina Poke at discharge. Zosyn stopped on 05/07/2019 by general surgery. Wound care as per general surgery recommendations. G-tube is in place. Patient is started on PO diet. Will repeat cbc AM  Anemia of chronic disease -Hemoglobin 7.9. no s/s of acute bleeding. Transfuse if hemoglobin is less than 7.  AKI. Non-anion gap metabolic acidosis -Resolved.  Nephrology signed off.    Acute respiratory failure with hypoxia. Resolved. -Chest x-ray showed patchy bilateral airspace opacities concerning for pneumonia on 04/27/2019, could be aspiration pneumonia or pulmonary edema.  treated with Zosyn. Blood cultures: NGTD. Currently on room air.  Acute metabolic encephalopathy, likely multifactorial. Resolved  -Most likely secondary to narcotic medication induced in  the setting of recent abdominal surgery along with pain/hyponatremia/AKI.  Had received multiple doses of Ativan for agitation -Mental status is much improved.  Monitor.  Avoid sedative medications as much as possible.  Generalized conditioning. PT recommends CIR consulted.  -Palliative Care consulted for goals of care discussion.  Patient remains full code.  Dispo: CIR. Insurance declined. Awaiting peer to peer   Code Status: full Family Communication: d/w patient, RN (indicate person spoken with, relationship, and if by phone, the number) Disposition Plan: awaiting CIR. Insurance. Peer to peer discussions    Consultants:  surgery  Procedures: exploratory laparotomy with sigmoid colectomy, colostomy and gastrostomy tube placement on 04/28/2019   Antibiotics: Anti-infectives (From admission, onward)   Start     Dose/Rate Route Frequency Ordered Stop   05/04/19 1400  piperacillin-tazobactam (ZOSYN) IVPB 3.375 g  Status:  Discontinued     3.375 g 12.5 mL/hr over 240 Minutes Intravenous Every 8 hours 05/04/19 1304 05/07/19 1043   04/29/19 1400  piperacillin-tazobactam (ZOSYN) IVPB 2.25 g  Status:  Discontinued     2.25 g 100 mL/hr over 30 Minutes Intravenous Every 8 hours 04/29/19 1101 05/04/19 1304   04/27/19 1000  cefTRIAXone (ROCEPHIN) 2 g in sodium chloride 0.9 % 100 mL IVPB  Status:  Discontinued     2 g 200 mL/hr over 30 Minutes Intravenous Every 24 hours 04/27/19 0859 04/29/19 1101   04/27/19 1000  metroNIDAZOLE (FLAGYL) IVPB 500 mg  Status:  Discontinued     500 mg 100 mL/hr over 60 Minutes Intravenous Every 8 hours 04/27/19 0859 04/29/19 1101   04/26/19 2200  piperacillin-tazobactam (ZOSYN) IVPB 3.375 g  Status:  Discontinued     3.375 g 12.5 mL/hr over 240 Minutes Intravenous Every 12 hours 04/26/19 1253 04/27/19 0858   04/26/19 1800  aztreonam (AZACTAM) 1  g in sodium chloride 0.9 % 100 mL IVPB  Status:  Discontinued     1 g 200 mL/hr over 30 Minutes Intravenous Every  12 hours 04/26/19 0805 04/26/19 1253   04/25/19 2030  ciprofloxacin (CIPRO) IVPB 200 mg  Status:  Discontinued     200 mg 100 mL/hr over 60 Minutes Intravenous Every 12 hours 04/25/19 1109 04/25/19 1640   04/25/19 2030  aztreonam (AZACTAM) 1 g in sodium chloride 0.9 % 100 mL IVPB  Status:  Discontinued     1 g 200 mL/hr over 30 Minutes Intravenous Every 8 hours 04/25/19 1651 04/26/19 0805   04/25/19 1730  aztreonam (AZACTAM) 2 g in sodium chloride 0.9 % 100 mL IVPB  Status:  Discontinued     2 g 200 mL/hr over 30 Minutes Intravenous  Once 04/25/19 1640 04/25/19 1651   04/25/19 1600  metroNIDAZOLE (FLAGYL) IVPB 500 mg  Status:  Discontinued     500 mg 100 mL/hr over 60 Minutes Intravenous Every 8 hours 04/25/19 1109 04/26/19 1253   04/25/19 0730  ciprofloxacin (CIPRO) IVPB 400 mg     400 mg 200 mL/hr over 60 Minutes Intravenous  Once 04/25/19 0729 04/25/19 0921   04/25/19 0730  metroNIDAZOLE (FLAGYL) IVPB 500 mg     500 mg 100 mL/hr over 60 Minutes Intravenous  Once 04/25/19 0729 04/25/19 0924       (indicate start date, and stop date if known)  HPI/Subjective: Persistent feculent output from JP drain. No acute changes in abdominal pains. Remains afebrile. Patient is in no acute distress.   Objective: Vitals:   05/13/19 0537 05/13/19 0800  BP: (!) 160/72 (!) 150/73  Pulse: 75 77  Resp: 14 18  Temp: 98.7 F (37.1 C)   SpO2: 97% 97%    Intake/Output Summary (Last 24 hours) at 05/13/2019 0859 Last data filed at 05/13/2019 0700 Gross per 24 hour  Intake 1395 ml  Output 1330 ml  Net 65 ml   Filed Weights   05/11/19 0449 05/12/19 0417 05/12/19 2145  Weight: 82 kg 80.5 kg 79 kg    Exam:   General:  No distress   Cardiovascular: s1,s2 rrr  Respiratory: no wheezing   Abdomen: soft, + colostomy. NT  Musculoskeletal: no leg edema    Data Reviewed: Basic Metabolic Panel: Recent Labs  Lab 05/07/19 0312 05/08/19 0155 05/09/19 0314 05/10/19 0314 05/11/19 0255  05/12/19 0235  NA 137 138 137 138 137 137  K 4.0 3.7 4.0 4.2 3.6 3.7  CL 102 104 103 103 101 103  CO2 25 25 27 28 26 22   GLUCOSE 117* 119* 108* 79 102* 104*  BUN 12 14 13 11 10 10   CREATININE 0.61 0.55 0.52 0.60 0.61 0.54  CALCIUM 8.0* 7.8* 8.1* 8.1* 8.0* 8.0*  MG 1.6* 2.0 1.9 1.9 1.9 1.9  PHOS 2.4* 2.7  --   --   --   --    Liver Function Tests: No results for input(s): AST, ALT, ALKPHOS, BILITOT, PROT, ALBUMIN in the last 168 hours. No results for input(s): LIPASE, AMYLASE in the last 168 hours. No results for input(s): AMMONIA in the last 168 hours. CBC: Recent Labs  Lab 05/08/19 0155 05/09/19 0314 05/10/19 0314 05/11/19 0255 05/12/19 0235  WBC 17.5* 17.5* 14.7* 14.5* 11.8*  NEUTROABS 15.3* 15.1* 12.4* 12.2* 9.6*  HGB 7.2* 7.4* 7.6* 7.3* 7.9*  HCT 22.5* 22.7* 24.0* 22.3* 24.1*  MCV 94.1 93.4 94.9 94.1 93.8  PLT 733* 709* 683* 593* 578*  Cardiac Enzymes: No results for input(s): CKTOTAL, CKMB, CKMBINDEX, TROPONINI in the last 168 hours. BNP (last 3 results) No results for input(s): BNP in the last 8760 hours.  ProBNP (last 3 results) No results for input(s): PROBNP in the last 8760 hours.  CBG: Recent Labs  Lab 05/12/19 1643 05/12/19 2011 05/13/19 0106 05/13/19 0448 05/13/19 0757  GLUCAP 119* 90 95 106* 93    No results found for this or any previous visit (from the past 240 hour(s)).   Studies: No results found.  Scheduled Meds: . acetaminophen  1,000 mg Oral Q8H  . Chlorhexidine Gluconate Cloth  6 each Topical Daily  . feeding supplement (ENSURE ENLIVE)  237 mL Oral TID BM  . methocarbamol  500 mg Oral TID  . multivitamin with minerals  1 tablet Oral Daily  . pantoprazole  40 mg Oral Daily  . saccharomyces boulardii  250 mg Oral BID   Continuous Infusions: . sodium chloride 500 mL (05/07/19 0650)  . feeding supplement (VITAL AF 1.2 CAL) 1,000 mL (05/12/19 2154)    Principal Problem:   Diverticulitis of colon with perforation Active  Problems:   Sepsis ([place])   Goals of care, counseling/discussion   Palliative care by specialist   Advanced care planning/counseling discussion    Time spent: >35 minutes     Kinnie Feil  [facility] Pager [phone]. If 7PM-7AM, please contact night-coverage at [email or web address], password [facility] 05/13/2019, 8:59 AM  LOS: 18 days

## 2019-05-13 NOTE — Progress Notes (Signed)
15 Days Post-Op   Subjective/Chief Complaint: none  Feels better   Objective: Vital signs in last 24 hours: Temp:  [98.6 F (37 C)-98.7 F (37.1 C)] 98.7 F (37.1 C) (10/15 0537) Pulse Rate:  [69-77] 77 (10/15 0800) Resp:  [14-18] 18 (10/15 0800) BP: (128-160)/(65-73) 150/73 (10/15 0800) SpO2:  [97 %-99 %] 97 % (10/15 0800) Weight:  [79 kg] 79 kg (10/14 2145) Last BM Date: 05/12/19  Intake/Output from previous day: 10/14 0701 - 10/15 0700 In: 1395 [P.O.:120; NG/GT:1235] Out: 1330 [Urine:1330] Intake/Output this shift: Total I/O In: 260 [P.O.:240; Other:20] Out: -    Gen:  Alert, NAD, pleasant Card:  Regular rate and rhythm Pulm:  Normal effort, clear to auscultation bilaterally Abd: Soft, non-tender, non-distended, +BS, midline wound with granulation tissue and some fibrinous tissue but overall clean and shallow, stoma working with soft brown stool in ostomy bag, drain in LLQ with feculent appearing drainage, G-tube present and intact Skin: warm and dry, no rashes  Psych: A&Ox3  Lab Results:  Recent Labs    05/11/19 0255 05/12/19 0235  WBC 14.5* 11.8*  HGB 7.3* 7.9*  HCT 22.3* 24.1*  PLT 593* 578*   BMET Recent Labs    05/11/19 0255 05/12/19 0235  NA 137 137  K 3.6 3.7  CL 101 103  CO2 26 22  GLUCOSE 102* 104*  BUN 10 10  CREATININE 0.61 0.54  CALCIUM 8.0* 8.0*   PT/INR No results for input(s): LABPROT, INR in the last 72 hours. ABG No results for input(s): PHART, HCO3 in the last 72 hours.  Invalid input(s): PCO2, PO2  Studies/Results: No results found.  Anti-infectives: Anti-infectives (From admission, onward)   Start     Dose/Rate Route Frequency Ordered Stop   05/04/19 1400  piperacillin-tazobactam (ZOSYN) IVPB 3.375 g  Status:  Discontinued     3.375 g 12.5 mL/hr over 240 Minutes Intravenous Every 8 hours 05/04/19 1304 05/07/19 1043   04/29/19 1400  piperacillin-tazobactam (ZOSYN) IVPB 2.25 g  Status:  Discontinued     2.25 g 100 mL/hr  over 30 Minutes Intravenous Every 8 hours 04/29/19 1101 05/04/19 1304   04/27/19 1000  cefTRIAXone (ROCEPHIN) 2 g in sodium chloride 0.9 % 100 mL IVPB  Status:  Discontinued     2 g 200 mL/hr over 30 Minutes Intravenous Every 24 hours 04/27/19 0859 04/29/19 1101   04/27/19 1000  metroNIDAZOLE (FLAGYL) IVPB 500 mg  Status:  Discontinued     500 mg 100 mL/hr over 60 Minutes Intravenous Every 8 hours 04/27/19 0859 04/29/19 1101   04/26/19 2200  piperacillin-tazobactam (ZOSYN) IVPB 3.375 g  Status:  Discontinued     3.375 g 12.5 mL/hr over 240 Minutes Intravenous Every 12 hours 04/26/19 1253 04/27/19 0858   04/26/19 1800  aztreonam (AZACTAM) 1 g in sodium chloride 0.9 % 100 mL IVPB  Status:  Discontinued     1 g 200 mL/hr over 30 Minutes Intravenous Every 12 hours 04/26/19 0805 04/26/19 1253   04/25/19 2030  ciprofloxacin (CIPRO) IVPB 200 mg  Status:  Discontinued     200 mg 100 mL/hr over 60 Minutes Intravenous Every 12 hours 04/25/19 1109 04/25/19 1640   04/25/19 2030  aztreonam (AZACTAM) 1 g in sodium chloride 0.9 % 100 mL IVPB  Status:  Discontinued     1 g 200 mL/hr over 30 Minutes Intravenous Every 8 hours 04/25/19 1651 04/26/19 0805   04/25/19 1730  aztreonam (AZACTAM) 2 g in sodium chloride 0.9 % 100  mL IVPB  Status:  Discontinued     2 g 200 mL/hr over 30 Minutes Intravenous  Once 04/25/19 1640 04/25/19 1651   04/25/19 1600  metroNIDAZOLE (FLAGYL) IVPB 500 mg  Status:  Discontinued     500 mg 100 mL/hr over 60 Minutes Intravenous Every 8 hours 04/25/19 1109 04/26/19 1253   04/25/19 0730  ciprofloxacin (CIPRO) IVPB 400 mg     400 mg 200 mL/hr over 60 Minutes Intravenous  Once 04/25/19 0729 04/25/19 0921   04/25/19 0730  metroNIDAZOLE (FLAGYL) IVPB 500 mg     500 mg 100 mL/hr over 60 Minutes Intravenous  Once 04/25/19 0729 04/25/19 0924      Assessment/Plan: s/p Procedure(s): EXPLORATORY LAPAROTOMY (N/A) SIGMOID COLECTOMY (N/A) COLOSTOMY (N/A) INSERTION OF GASTROSTOMY TUBE  (N/A) POD 14, s/pExploratory laparotomywith sigmoid colectomy, colostomy, and Stamm gastrostomy tube placement, Dr. Luisa Hart 9/30 for expected severe diverticulitis with multiple pelvic abscesses  -Pathology with diverticulitis -Stoma viable - BID wet to dry dressing changes. -nightly TFs to assist with nutritional support -JP drain appears feculent. If this is the case, it would be concerning for a rectal stump leaking; however, as long as it is controlled through this drain, she is diverted proximal to this and likely would not need anything further unless she had symptoms of undrained collection.   ID -no current abx VTE -SCDs,  FEN -regulardiet/tube feeding-cyclic in p.mto try to increase appetite during the day Foley -foleyout Follow up -Dr. Luisa Hart  Dispo- possibly CIR  LOS: 18 days    Joanne Burke 05/13/2019

## 2019-05-13 NOTE — Progress Notes (Signed)
Inpatient Rehab Admissions Coordinator:    Called [employer]'s peer to peer line for update on patient's case and notified that denial has been upheld.  I have let pt know.  Also note updated PT recs for HHPT as pt has progressed to min guard 150'.  Will sign off at this time.  CSW aware.   Shann Medal, PT, DPT Admissions Coordinator [phone] 05/13/19  3:12 PM

## 2019-05-13 NOTE — Progress Notes (Signed)
Physical Therapy Treatment Patient Details Name: Joanne Burke MRN: [id number] DOB: 12-Apr-1931 Today's Date: 05/13/2019    History of Present Illness 85 y.o. female with no significant medical history presenting with abdominal pain. Pt admitted for diverticulitis of colon with perforation, sepsis. On 04/28/19 she underwent sigmoid colectomy, colostomy and Stamm gastrostomy tube placement.    PT Comments    Pt performed gt training with min guard to supervision.  She continues to progress well.  Plan next session for gt training with youth height RW.  HHPT remains appropriate.      Follow Up Recommendations  Home health PT;Supervision for mobility/OOB     Equipment Recommendations  Rolling walker with 5" wheels    Recommendations for Other Services       Precautions / Restrictions Precautions Precautions: Fall Precaution Comments: bulb drain, colostomy, G tube Restrictions Weight Bearing Restrictions: No    Mobility  Bed Mobility Overal bed mobility: Needs Assistance Bed Mobility: Supine to Sit;Sit to Supine Rolling: Supervision Sidelying to sit: Supervision       General bed mobility comments: Cues for rolling and moving to sit edge of bed. Supervision for safety.  Transfers Overall transfer level: Needs assistance Equipment used: Rolling walker (2 wheeled) Transfers: Sit to/from Stand Sit to Stand: Supervision         General transfer comment: Cues for hand placement to and from seated surface.  Ambulation/Gait Ambulation/Gait assistance: Min guard;Supervision Gait Distance (Feet): 120 Feet Assistive device: Rolling walker (2 wheeled) Gait Pattern/deviations: Step-through pattern;Trunk flexed Gait velocity: decreased   General Gait Details: Pt required standing rest break after about 60 ft of gt training.  Pt is progressing well.  She remains flexed and would benefit from youth height RW next session for improved posture.   Stairs              Wheelchair Mobility    Modified Rankin (Stroke Patients Only)       Balance Overall balance assessment: Needs assistance Sitting-balance support: Feet supported Sitting balance-Leahy Scale: Good Sitting balance - Comments: posterior bias initially     Standing balance-Leahy Scale: Good Standing balance comment: intermittent support with UEs                            Cognition Arousal/Alertness: Awake/alert Behavior During Therapy: WFL for tasks assessed/performed Overall Cognitive Status: Within Functional Limits for tasks assessed                                 General Comments: Pt continues to present with STM but otherwise within functional limits.      Exercises General Exercises - Lower Extremity Ankle Circles/Pumps: AROM;10 reps;Supine Heel Slides: Both;10 reps;Supine Straight Leg Raises: Both;Supine;10 reps;AROM    General Comments        Pertinent Vitals/Pain Pain Assessment: Faces Faces Pain Scale: Hurts a little bit Pain Location: abdomen Pain Descriptors / Indicators: Grimacing;Guarding;Discomfort Pain Intervention(s): Monitored during session;Repositioned    Home Living Family/patient expects to be discharged to:: Skilled nursing facility                    Prior Function            PT Goals (current goals can now be found in the care plan section) Acute Rehab PT Goals Patient Stated Goal: to go to CIR, or home with HHPT. She does not  want to go to SNF Potential to Achieve Goals: Good Progress towards PT goals: Progressing toward goals    Frequency    Min 3X/week      PT Plan Current plan remains appropriate    Co-evaluation              AM-PAC PT "6 Clicks" Mobility   Outcome Measure  Help needed turning from your back to your side while in a flat bed without using bedrails?: A Little Help needed moving from lying on your back to sitting on the side of a flat bed without using  bedrails?: A Little Help needed moving to and from a bed to a chair (including a wheelchair)?: A Little Help needed standing up from a chair using your arms (e.g., wheelchair or bedside chair)?: A Little Help needed to walk in hospital room?: A Little Help needed climbing 3-5 steps with a railing? : A Lot 6 Click Score: 17    End of Session Equipment Utilized During Treatment: Gait belt Activity Tolerance: Patient tolerated treatment well Patient left: in bed;with call bell/phone within reach;with family/visitor present Nurse Communication: Mobility status PT Visit Diagnosis: Muscle weakness (generalized) (M62.81);Difficulty in walking, not elsewhere classified (R26.2);Pain Pain - Right/Left: (abdomen)     Time: 0102-7253 PT Time Calculation (min) (ACUTE ONLY): 14 min  Charges:  $Gait Training: 8-22 mins                     Joanne Burke, PTA Acute Rehabilitation Services Pager [phone] Office [place] Late[place] 05/13/2019, 4:40 PM

## 2019-05-14 LAB — GLUCOSE, CAPILLARY
Glucose-Capillary: 111 mg/dL — ABNORMAL HIGH (ref 70–99)
Glucose-Capillary: 112 mg/dL — ABNORMAL HIGH (ref 70–99)
Glucose-Capillary: 115 mg/dL — ABNORMAL HIGH (ref 70–99)
Glucose-Capillary: 143 mg/dL — ABNORMAL HIGH (ref 70–99)
Glucose-Capillary: 160 mg/dL — ABNORMAL HIGH (ref 70–99)
Glucose-Capillary: 62 mg/dL — ABNORMAL LOW (ref 70–99)
Glucose-Capillary: 84 mg/dL (ref 70–99)

## 2019-05-14 LAB — CBC
HCT: 22.5 % — ABNORMAL LOW (ref 36.0–46.0)
Hemoglobin: 7.1 g/dL — ABNORMAL LOW (ref 12.0–15.0)
MCH: 30 pg (ref 26.0–34.0)
MCHC: 31.6 g/dL (ref 30.0–36.0)
MCV: 94.9 fL (ref 80.0–100.0)
Platelets: 433 10*3/uL — ABNORMAL HIGH (ref 150–400)
RBC: 2.37 MIL/uL — ABNORMAL LOW (ref 3.87–5.11)
RDW: 18.8 % — ABNORMAL HIGH (ref 11.5–15.5)
WBC: 8.1 10*3/uL (ref 4.0–10.5)
nRBC: 0 % (ref 0.0–0.2)

## 2019-05-14 NOTE — TOC Progression Note (Signed)
Transition of Care [facility]) - Progression Note    Patient Details  Name: Joanne Burke MRN: [id number] Date of Birth: 12-12-1930  Transition of Care [facility]) CM/SW [place], LCSW Phone Number: 05/14/2019, 2:43 PM  Clinical Narrative:    CSW confirmed with patient choice of Home Health. [facility] will be able to begin services 48 hours after discharge; MD will need to place a Face to Face order. Patient reports agreement with plan and declined need for equipment.    Expected Discharge Plan: [place] Barriers to Discharge: Continued Medical Work up  Expected Discharge Plan and Services Expected Discharge Plan: [place]   Discharge Planning Services: CM Consult   Living arrangements for the past 2 months: Single Family Home                 DME Arranged: N/A DME Agency: NA       HH Arranged: NA HH Agency: [facility] Date [place]: 05/06/19 Time [place]: [id number] Representative spoke with at [place]: [place] ([place]) Interventions    Readmission Risk Interventions No flowsheet data found.

## 2019-05-14 NOTE — Consult Note (Signed)
[place] Nurse ostomy follow up Stoma type/location: RLQ end colostomy Stomal assessment/size: 2" pink and moist with appropriate output.  Continues with JP drain with feculent effluent.  No further intervention needed at this time, per MD.  Patient anticipates discharge to SNF>  Spouse has observed pouch change and feels he can help when she makes it home.  Supplies were not delivered to room so I have reordered.   Peristomal assessment: not assessed.  pOuch intact Treatment options for stomal/peristomal skin: barrier ring and convex pouch Output soft brown stool Ostomy pouching: 1pc.convex with barrier ring Education provided: Explained that I would order more supplies for room Enrolled patient in [place] Start Discharge program: Yes [place] team will follow through admission.  Domenic Moras MSN, RN, FNP-BC CWON Wound, Ostomy, Continence Nurse Pager [phone]

## 2019-05-14 NOTE — Progress Notes (Addendum)
PROGRESS NOTE  [name] [name]:[id number] DOB: Dec 02, 1930 DOA: 04/25/2019 PCP: Marden Noble, MD  Brief Narrative: HPI: Joanne Burke is an 85 year old right-handed female with history of tobacco abuse as well as constipation on no prescription medications.  Patient was admitted with abdominal pain.  Was found to have sigmoid diverticulitis admitted with multiple pelvic abscess underwent exploratory laparotomy with colectomy colostomy and gastrostomy on April 28, 2019   Interval history/Subjective: Patient seen and examined at bedside with her husband Joanne Burke Stable in the room.  She stated she is doing well denies any nausea vomiting.  PresentLY patient was eating burger with milkshake that was brought to her by her husband.  Patient is awaiting placement for rehab  Assessment/Plan #1 diverticulitis sigmoid diverticulitis colectomy gastrostomy tube placement on April 21, 2019, JP drain is in place.  General surgery is following.  Patient is tolerating her feeding well and oral food well 2.  Pelvic abscess patient has completed her Zosyn 3.  Tobacco abuse patient advised to reduce tobacco use 4.  AKI secondary to contrasted imaging improving 5.  Anemia hemoglobin is 7.1 we will continue to monitor and transfuse as needed 6.  Leukocytosis resolved 7.  Acute respiratory failure with hypoxia has resolved she was treated with Zosyn for possible pneumonia on chest x-ray of April 27, 2019, she is currently on room air.     DVT prophylaxis: SCD Code Status: Full Family Communication: Husband Bill at bedside Disposition Plan: [place] long-term rehab  Time spent greater than 25 minutes  Dr. Barrie Folk [facility] Pager [phone]  05/14/2019, 10:48 AM  LOS: 19 days   Consultants:  General surgery Wound care Procedures:  Exploratory laparotomy with sigmoid colectomy, colostomy, ostomy G-tube and gastrostomy  Antimicrobials:  None   Objective: Vitals:  Vitals:   05/13/19  2035 05/14/19 0432  BP: (!) 146/79 119/60  Pulse: 76 74  Resp:    Temp: 98.6 F (37 C) 98.5 F (36.9 C)  SpO2: 95% 97%    Exam:  Constitutional:  . Appears calm and comfortable thin Eyes:  . pupils and irises appear normal . Normal lids and conjunctivae ENMT:  . grossly normal hearing  . Lips appear normal . external ears, nose appear normal . Oropharynx: mucosa, tongue,posterior pharynx appear normal Neck:  . neck appears normal, no masses, normal ROM, supple . no thyromegaly Respiratory:  . CTA bilaterally, no w/r/r.  . Respiratory effort normal. No retractions or accessory muscle use Cardiovascular:  . RRR, no m/r/g . No LE extremity edema   . Normal pedal pulses Abdomen:  . Abdomen appears normal; no tenderness or masses.  Drains are in place . No hernias . No HSM Musculoskeletal:  . Digits/nails BUE: no clubbing, cyanosis, petechiae, infection . exam of joints, bones, muscles of at least one of following: head/neck, RUE, LUE, RLE, LLE   o strength and tone normal, no atrophy, no abnormal movements o No tenderness, masses o Normal ROM, no contractures  . gait and station Skin:  . No rashes, lesions, ulcers . palpation of skin: no induration or nodules Neurologic:  . CN 2-12 intact . Sensation all 4 extremities intact Psychiatric:  . Mental status o Mood, affect appropriate o Orientation to person, place, time  . judgment and insight appear intact   I have personally reviewed the following:   Data:    05/14/19 0303  CBC  Collected: 05/14/19 0209  Final result  Specimen: Blood   WBC 8.1 K/uL MCH 30.0 pg  RBC  2.37Low  MIL/uL MCHC 31.6 g/dL  Hemoglobin 7.1Low  g/dL RDW 18.8High  %  HCT 22.5Low  % Platelets 433High  K/uL  MCV 94.9 fL nRBC 0.0 %        05/14/19 0039  Glucose, capillary     .   Scheduled Meds: . acetaminophen  1,000 mg Oral Q8H  . Chlorhexidine Gluconate Cloth  6 each Topical Daily  . feeding supplement (ENSURE ENLIVE)  237  mL Oral TID BM  . methocarbamol  500 mg Oral TID  . multivitamin with minerals  1 tablet Oral Daily  . pantoprazole  40 mg Oral Daily  . saccharomyces boulardii  250 mg Oral BID   Continuous Infusions: . sodium chloride 500 mL (05/07/19 0650)  . feeding supplement (VITAL AF 1.2 CAL) Stopped (05/14/19 0747)    Principal Problem:   Diverticulitis of colon with perforation Active Problems:   Sepsis ([place])   Goals of care, counseling/discussion   Palliative care by specialist   Advanced care planning/counseling discussion   LOS: 19 days

## 2019-05-14 NOTE — Progress Notes (Signed)
Physical Therapy Treatment Patient Details Name: Joanne Burke MRN: [id number] DOB: Sep 01, 1930 Today's Date: 05/14/2019    History of Present Illness 85 y.o. female with no significant medical history presenting with abdominal pain. Pt admitted for diverticulitis of colon with perforation, sepsis. On 04/28/19 she underwent sigmoid colectomy, colostomy and Stamm gastrostomy tube placement.    PT Comments    Pt progressing towards goals. Required min guard to supervision assist for gait with RW. Required standing rest X1 secondary to fatigue. Current recommendations appropriate. Will continue to follow acutely to maximize functional mobility independence and safety.     Follow Up Recommendations  Home health PT;Supervision for mobility/OOB [facility]; [facility])     Equipment Recommendations  Rolling walker with 5" wheels    Recommendations for Other Services       Precautions / Restrictions Precautions Precautions: Fall Restrictions Weight Bearing Restrictions: No    Mobility  Bed Mobility Overal bed mobility: Needs Assistance Bed Mobility: Sidelying to Sit;Rolling;Sit to Sidelying Rolling: Supervision Sidelying to sit: Supervision     Sit to sidelying: Min assist General bed mobility comments: Assist with LEs to return back to sidelying.   Transfers Overall transfer level: Needs assistance Equipment used: Rolling walker (2 wheeled) Transfers: Sit to/from Stand Sit to Stand: Supervision         General transfer comment: Cues for hand placement to and from seated surface.  Ambulation/Gait Ambulation/Gait assistance: Min guard;Supervision Gait Distance (Feet): 120 Feet Assistive device: Rolling walker (2 wheeled) Gait Pattern/deviations: Step-through pattern;Trunk flexed Gait velocity: decreased   General Gait Details: Required standing rest in the middle of gait training secondary to fatigue. Cues for upright posture during gait.    Stairs              Wheelchair Mobility    Modified Rankin (Stroke Patients Only)       Balance Overall balance assessment: Needs assistance Sitting-balance support: Feet supported Sitting balance-Leahy Scale: Good     Standing balance support: Bilateral upper extremity supported;During functional activity Standing balance-Leahy Scale: Poor Standing balance comment: Reliant on BUE support                             Cognition Arousal/Alertness: Awake/alert Behavior During Therapy: WFL for tasks assessed/performed Overall Cognitive Status: Within Functional Limits for tasks assessed                                 General Comments: Pt continues to present with STM but otherwise within functional limits.      Exercises General Exercises - Lower Extremity Hip Flexion/Marching: AROM;Both;10 reps;Standing(using RW )    General Comments General comments (skin integrity, edema, etc.): Required assist for clean up after urinating.       Pertinent Vitals/Pain Pain Assessment: Faces Faces Pain Scale: Hurts a little bit Pain Location: abdomen Pain Descriptors / Indicators: Grimacing;Guarding;Discomfort Pain Intervention(s): Monitored during session;Limited activity within patient's tolerance;Repositioned    Home Living                      Prior Function            PT Goals (current goals can now be found in the care plan section) Acute Rehab PT Goals Patient Stated Goal: to go home with HHPT  PT Goal Formulation: With patient/family Time For Goal Achievement: 05/17/19 Potential to Achieve Goals: Good  Progress towards PT goals: Progressing toward goals    Frequency    Min 3X/week      PT Plan Current plan remains appropriate    Co-evaluation              AM-PAC PT "6 Clicks" Mobility   Outcome Measure  Help needed turning from your back to your side while in a flat bed without using bedrails?: None Help needed moving from lying on  your back to sitting on the side of a flat bed without using bedrails?: A Little Help needed moving to and from a bed to a chair (including a wheelchair)?: A Little Help needed standing up from a chair using your arms (e.g., wheelchair or bedside chair)?: A Little Help needed to walk in hospital room?: A Little Help needed climbing 3-5 steps with a railing? : A Lot 6 Click Score: 18    End of Session Equipment Utilized During Treatment: Gait belt Activity Tolerance: Patient tolerated treatment well Patient left: in bed;with call bell/phone within reach;with family/visitor present;with bed alarm set Nurse Communication: Mobility status;Other (comment)(pt needed colostomy emptied) PT Visit Diagnosis: Muscle weakness (generalized) (M62.81);Difficulty in walking, not elsewhere classified (R26.2);Pain Pain - part of body: (abdomen)     Time: 7989-2119 PT Time Calculation (min) (ACUTE ONLY): 31 min  Charges:  $Gait Training: 8-22 mins $Therapeutic Activity: 8-22 mins                     Leighton Ruff, PT, DPT  Acute Rehabilitation Services  Pager: [phone] Office: [phone]    Rudean Hitt 05/14/2019, 6:29 PM

## 2019-05-14 NOTE — Final Consult Note (Signed)
Consultant Final Sign-Off Note    Assessment/Final recommendations  Joanne Burke is a 85 y.o. female followed by me for  severe diverticulitis s/pExploratory laparotomywith sigmoid colectomy, colostomy, and Stamm gastrostomy tube placement, Dr. Brantley Stage 9/30 -Pathology showed diverticulitis -Stoma viable and patient having good stool output.  Tolerating regular diet - BID wet to dry dressing changes. - nightly TFs to assist with nutritional support if needed at discharge per medicine.  G-tube will remain in at discharge -JP drain appears feculent. Possible rectal stump leaking; she is diverted proximal to this and likely would not need anything further unless she had symptoms of undrained collection.  - WBC 8.1, afebrile, no abdominal pain   Wound care (if applicable): Wet-to-dry to midline   Diet at discharge: Regular diet tube feeds per medicine   Activity at discharge: per primary team   Follow-up appointment: In AVS   Pending results:  Unresulted Labs (From admission, onward)    Start     Ordered   Signed and Held  Comprehensive metabolic panel  Tomorrow morning,   R    Question:  Specimen collection method  Answer:  Lab=Lab collect   Signed and Held   Signed and Held  CBC WITH DIFFERENTIAL  Tomorrow morning,   R    Question:  Specimen collection method  Answer:  Lab=Lab collect   Signed and Held           Medication recommendations: Bowel regiment   Other recommendations: continue surgical drain at discharge, patient to record output, will review at follow-up appointment    Thank you for allowing [place] to participate in the care of your patient!  Please consult [place] again if you have further needs for your patient.  Kalman Drape 05/14/2019 11:39 AM    Subjective   CC: No complaints  Patient denies abdominal pain at this time.  She denies nausea, vomiting, fever or chills.  Her family member is bringing her hamburgers from [employer].  She is excited  about this.  I discussed continuing the surgical drain and leaving the G-tube in at this time.  These will be reviewed at her follow-up appointment.  Patient expressed understanding.  Objective  Vital signs in last 24 hours: Temp:  [98.5 F (36.9 C)-98.8 F (37.1 C)] 98.5 F (36.9 C) (10/16 0432) Pulse Rate:  [74-76] 74 (10/16 0432) Resp:  [16] 16 (10/15 1153) BP: (119-146)/(60-79) 119/60 (10/16 0432) SpO2:  [95 %-97 %] 97 % (10/16 0432) Weight:  [78.6 kg] 78.6 kg (10/16 0436)  Physical exam General: Ill-appearing, no acute distress Lungs: Rate and effort normal.  Normal work of breathing GI: Copious stool in ostomy, midline with good base of granulation tissue and without purulent drainage or concerns of infection, normal bowel sounds, G-tube in place and site is clean, surgical drain in place with small amount of feculent looking material in bulb, no TTP, no peritonitis   Pertinent labs and Studies: Recent Labs    05/12/19 0235 05/14/19 0209  WBC 11.8* 8.1  HGB 7.9* 7.1*  HCT 24.1* 22.5*   BMET Recent Labs    05/12/19 0235  NA 137  K 3.7  CL 103  CO2 22  GLUCOSE 104*  BUN 10  CREATININE 0.54  CALCIUM 8.0*   No results for input(s): LABURIN in the last 72 hours. Results for orders placed or performed during the hospital encounter of 04/25/19  SARS CORONAVIRUS 2 (TAT 6-24 HRS) Nasopharyngeal Nasopharyngeal Swab     Status: None  Collection Time: 04/25/19 10:57 AM   Specimen: Nasopharyngeal Swab  Result Value Ref Range Status   SARS Coronavirus 2 NEGATIVE NEGATIVE Final    Comment: (NOTE) SARS-CoV-2 target nucleic acids are NOT DETECTED. The SARS-CoV-2 RNA is generally detectable in upper and lower respiratory specimens during the acute phase of infection. Negative results do not preclude SARS-CoV-2 infection, do not rule out co-infections with other pathogens, and should not be used as the sole basis for treatment or other patient management  decisions. Negative results must be combined with clinical observations, patient history, and epidemiological information. The expected result is Negative. Fact Sheet for Patients: HairSlick.no Fact Sheet for Healthcare Providers: quierodirigir.com This test is not yet approved or cleared by the [place] FDA and  has been authorized for detection and/or diagnosis of SARS-CoV-2 by FDA under an Emergency Use Authorization (EUA). This EUA will remain  in effect (meaning this test can be used) for the duration of the COVID-19 declaration under Section 56 4(b)(1) of the Act, 21 U.S.C. section 360bbb-3(b)(1), unless the authorization is terminated or revoked sooner. Performed at [facility], [address] [place], [place] [address]   MRSA PCR Screening     Status: None   Collection Time: 04/25/19  2:48 PM   Specimen: Nasal Mucosa; Nasopharyngeal  Result Value Ref Range Status   MRSA by PCR NEGATIVE NEGATIVE Final    Comment:        The GeneXpert MRSA Assay (FDA approved for NASAL specimens only), is one component of a comprehensive MRSA colonization surveillance program. It is not intended to diagnose MRSA infection nor to guide or monitor treatment for MRSA infections. Performed at [facility], [address] [place], [place] [address]   Culture, blood (x 2)     Status: None   Collection Time: 04/25/19  5:05 PM   Specimen: BLOOD LEFT HAND  Result Value Ref Range Status   Specimen Description BLOOD LEFT HAND  Final   Special Requests   Final    AEROBIC BOTTLE ONLY Blood Culture results may not be optimal due to an inadequate volume of blood received in culture bottles   Culture   Final    NO GROWTH 5 DAYS Performed at [facility], [address] [place], [place] [address]    Report Status 04/30/2019 FINAL  Final  Culture, blood (x 2)     Status: None   Collection Time: 04/25/19  5:12 PM    Specimen: BLOOD RIGHT HAND  Result Value Ref Range Status   Specimen Description BLOOD RIGHT HAND  Final   Special Requests AEROBIC BOTTLE ONLY Blood Culture adequate volume  Final   Culture   Final    NO GROWTH 5 DAYS Performed at [facility], [address] [place], [place] [address]    Report Status 04/30/2019 FINAL  Final    Imaging: No results found.

## 2019-05-14 NOTE — Progress Notes (Signed)
Hypoglycemic Event  CBG: 62  Treatment: 8 oz juice/soda  Symptoms: Dizziness.  Follow-up CBG: Time: 2047 CBG Result: 160  Possible Reasons for Event: Inadequate meal intake  Comments/MD notified: [place], NP notified via text page.    Sylvie Farrier

## 2019-05-15 DIAGNOSIS — R188 Other ascites: Secondary | ICD-10-CM

## 2019-05-15 LAB — GLUCOSE, CAPILLARY
Glucose-Capillary: 107 mg/dL — ABNORMAL HIGH (ref 70–99)
Glucose-Capillary: 72 mg/dL (ref 70–99)
Glucose-Capillary: 84 mg/dL (ref 70–99)
Glucose-Capillary: 91 mg/dL (ref 70–99)
Glucose-Capillary: 96 mg/dL (ref 70–99)
Glucose-Capillary: 99 mg/dL (ref 70–99)

## 2019-05-15 MED ORDER — VITAL AF 1.2 CAL PO LIQD: 1000.0000 mL | ORAL | 0 refills | Status: DC

## 2019-05-15 MED ORDER — ADULT MULTIVITAMIN W/MINERALS CH: 1.0000 | ORAL_TABLET | Freq: Every day | ORAL | 0 refills | Status: AC

## 2019-05-15 MED ORDER — PANTOPRAZOLE SODIUM 40 MG PO TBEC: 40.0000 mg | DELAYED_RELEASE_TABLET | Freq: Every day | ORAL | 0 refills | Status: AC

## 2019-05-15 MED ORDER — TRAZODONE HCL 50 MG PO TABS: 25.0000 mg | ORAL_TABLET | Freq: Every evening | ORAL | 0 refills | Status: AC | PRN

## 2019-05-15 MED ORDER — METHOCARBAMOL 500 MG PO TABS: 500.0000 mg | ORAL_TABLET | Freq: Three times a day (TID) | ORAL | 0 refills | Status: AC

## 2019-05-15 MED ORDER — SACCHAROMYCES BOULARDII 250 MG PO CAPS: 250.0000 mg | ORAL_CAPSULE | Freq: Two times a day (BID) | ORAL | 0 refills | Status: AC

## 2019-05-15 MED ORDER — ENSURE ENLIVE PO LIQD: 237.0000 mL | Freq: Three times a day (TID) | ORAL | 0 refills | Status: DC

## 2019-05-15 NOTE — Plan of Care (Signed)

## 2019-05-15 NOTE — Care Management (Addendum)
Notified by attending that patient will now need tube feeds at DC. This was not part of the previous DC plan.  Patient is currently getting nocturnal feeds on pump and eating sufficiently during the day per her bedside nurse.  Neither patient or husband have had any training on tube feeds.  At this time, orders are not  written and therefore supplies cannot be ordered to floor. MD is aware that orders for tube feeding supplies and pump, need written and formula and rate of administration need specified in comment section of order.  LVM will Dorian Pod at [facility] that patient will now also have tube feeds and will not DC today due to lack of training and supplies.

## 2019-05-15 NOTE — Progress Notes (Addendum)
PROGRESS NOTE  Joanne Burke TZG:[id number] DOB: [birth year] DOA: 04/25/2019 PCP: Marden Noble, MD  Brief Narrative: HPI: Joanne Burke is an 85 year old right-handed female with history of tobacco abuse as well as constipation on no prescription medications.  Patient was admitted with abdominal pain.  Was found to have sigmoid diverticulitis admitted with multiple pelvic abscess underwent exploratory laparotomy with colectomy colostomy and gastrostomy on April 28, 2019   Interval history/Subjective: Patient seen and examined at bedside with her husband Annette Stable in the room.  She stated she is doing well denies any nausea vomiting.  PresentLY patient was eating burger with milkshake that was brought to her by her husband.  Patient is awaiting placement for rehab  May 15, 2019 update: Patient denies any complaints today.  They had agreed not to go to [facility], but to be discharged home with home health.  Patient is very eager to be discharged home arrangement was being made Home health has been consulted.  But unfortunately patient could not be discharged home today because she is on a tube feeding and they are not able to be taught her elderly husband is not able to be taught how to do the tube feeding until home health is able to come to the home.  So the discharge was held.  Patient should be discharged home tomorrow with proper arrangement on the tube feeding and tube feeding care and teaching  Assessment/Plan #1 diverticulitis of the sigmoidstatus post exploratory laparotomy with sigmoid colectomy colostomy and gastrostomy tube placement on April 21, 2019 JP drain IN place.  General surgery stated she is okay to be discharged with the tubes and drains she will follow-up with Dr. Luisa Hart at discharge.  Patient is tolerating her p.o. fluid well. 2.  Pelvic abscess.  Patient has completed her Zosyn antibiotics 3.  Tobacco abuse patient advised to reduce tobacco use 4.  AKI secondary to  contrasted imaging improving 5.  Anemia hemoglobin is 7.1 we will continue to monitor and transfuse as needed 6.  Leukocytosis resolved 7.  Acute respiratory failure with hypoxia has resolved she was treated with Zosyn for possible pneumonia on chest x-ray of April 27, 2019, she is currently on room air.     DVT prophylaxis: SCD Code Status: Full Family Communication: Husband Bill at bedside Disposition Plan: Discharge home with home health. Barrier to discharge: Husband unable to do for tube feeding.  Patient should be discharged home tomorrow with proper arrangement on the tube feeding and tube feeding care and teaching Time spent greater than 25 minutes   Dr. Barrie Folk [facility] Pager [phone]  05/15/2019, 5:39 PM  LOS: 20 days   Consultants:  General surgery Wound care Procedures:  Exploratory laparotomy with sigmoid colectomy, colostomy, ostomy G-tube and gastrostomy  Antimicrobials:  None   Objective: Vitals:  Vitals:   05/15/19 1200 05/15/19 1637  BP: 129/71 (!) 143/78  Pulse: 80 75  Resp: 17 17  Temp: 98.3 F (36.8 C) 98.6 F (37 C)  SpO2: 98% 98%    Exam:  Constitutional:  . Appears calm and comfortable but thin Eyes:  . pupils and irises appear normal . Normal lids and conjunctivae ENMT:  . grossly normal hearing  . Lips appear normal . external ears, nose appear normal . Oropharynx: mucosa, tongue,posterior pharynx appear normal Neck:  . neck appears normal, no masses, normal ROM, supple . no thyromegaly Respiratory:  . CTA bilaterally, no w/r/r.  . Respiratory effort normal. No retractions or accessory muscle use  Cardiovascular:  . RRR, no m/r/g . No LE extremity edema   . Normal pedal pulses Abdomen:  . Abdomen appears normal; no tenderness or masses.  Drains are in place . No hernias . No HSM Musculoskeletal:  . Digits/nails BUE: no clubbing, cyanosis, petechiae, infection . exam of joints, bones, muscles of at least one  of following: head/neck, RUE, LUE, RLE, LLE   o strength and tone normal, no atrophy, no abnormal movements o No tenderness, masses o Normal ROM, no contractures  . gait and station Skin:  . No rashes, lesions, ulcers . palpation of skin: no induration or nodules Neurologic:  . CN 2-12 intact . Sensation all 4 extremities intact Psychiatric:  . Mental status o Mood, affect appropriate o Orientation to person, place, time  . judgment and insight appear intact   I have personally reviewed the following:   Data:    05/14/19 0303  CBC  Collected: 05/14/19 0209  Final result  Specimen: Blood   WBC 8.1 K/uL MCH 30.0 pg  RBC 2.37Low  MIL/uL MCHC 31.6 g/dL  Hemoglobin 7.1Low  g/dL RDW 18.8High  %  HCT 22.5Low  % Platelets 433High  K/uL  MCV 94.9 fL nRBC 0.0 %        05/14/19 0039  Glucose, capillary     .   Scheduled Meds: . acetaminophen  1,000 mg Oral Q8H  . Chlorhexidine Gluconate Cloth  6 each Topical Daily  . feeding supplement (ENSURE ENLIVE)  237 mL Oral TID BM  . methocarbamol  500 mg Oral TID  . multivitamin with minerals  1 tablet Oral Daily  . pantoprazole  40 mg Oral Daily  . saccharomyces boulardii  250 mg Oral BID   Continuous Infusions: . sodium chloride 500 mL (05/07/19 0650)  . feeding supplement (VITAL AF 1.2 CAL) Stopped (05/15/19 0916)    Principal Problem:   Diverticulitis of colon with perforation Active Problems:   Sepsis ([place])   Goals of care, counseling/discussion   Palliative care by specialist   Advanced care planning/counseling discussion   Pelvic fluid collection   LOS: 20 days

## 2019-05-16 DIAGNOSIS — A419 Sepsis, unspecified organism: Secondary | ICD-10-CM | POA: Diagnosis not present

## 2019-05-16 DIAGNOSIS — R188 Other ascites: Secondary | ICD-10-CM

## 2019-05-16 LAB — GLUCOSE, CAPILLARY
Glucose-Capillary: 127 mg/dL — ABNORMAL HIGH (ref 70–99)
Glucose-Capillary: 97 mg/dL (ref 70–99)
Glucose-Capillary: 126 mg/dL — ABNORMAL HIGH (ref 70–99)

## 2019-05-16 NOTE — Discharge Summary (Addendum)
Physician Discharge Summary  Joanne Burke ZOX:[id number] DOB: 01-Aug-1930 DOA: 04/25/2019  PCP: Marden Noble, MD  Admit date: 04/25/2019 Discharge date: 05/16/2019  Admitted From: Home  Disposition:  Home with [facility]   Recommendations for Outpatient Follow-up:  1. Follow up with PCP in 1-2 weeks 2. Follow up with General Surgery as directed 3. Dr. Kevan Ny: Please obtain CBC in 1 week to follow up anemia, BMP/Cr in 1 week to follow up AKI      Home Health: Yes  Equipment/Devices: None  Discharge Condition: Fair  CODE STATUS: FULL Diet recommendation: Regular diet per Surgery  Brief/Interim Summary: Joanne Burke is an 85 y.o. F with no significant PMHx who presented with acute abdominal pain.    In the ER, found to have CT evidence of possible diverticulitis, abscess versus fluid in the abdomen along with acute kidney injury, lactic acidosis and leukocytosis. Surgery was consulted and recommended sigmoid colectomy, colostomy and gastrostomy tube placement. Nephrology was also consulted for acute kidney injury.      PRINCIPAL HOSPITAL DIAGNOSIS: Acute diverticulitis with abscess    Discharge Diagnoses:   Sigmoid diverticulitis with multiple pelvic abscesses S/p exploratory laparotomy with sigmoid colectomy, colostomy and gastrostomy tube placement on 04/28/2019  Sepsis from diverticulitis JP drain in place, persistent mild feculent output. Per surgery, this was a possible rectal stump leak, but of no concern given proximal diversion.  Leukocytosis normalized.  Antibiotics stopped and patient afebrile and without worsening pain.  Able to tolerate solid food.  Required temporary night time tube feeds as supplement, but on day of discharge able to eat solid food without pain or nausea, pain in abdomen resolved, and comfortable for discharge to home. -No tube feeds at night -Advice given re: protein supplement alternatives to Ensure -General Surgery follow up arranged by Gen  Surg    Anemia of chronic disease Hgb stable in 7s for last week.  No clinical bleeding.  Acute kidney injury Non-anion gap metabolic acidosis Hemodynamically mediated.  Peak Cr 2.5 mg/dL.  Resolved.   Acute respiratory failure with hypoxia Chest x-ray showed patchy bilateral airspace opacities concerning for pneumonia vs edema on 04/27/2019.  Treated with Zosyn, resolved.     Acute metabolic encephalopathy Transient.  Now resolved.  Most likely secondary to benzodiazepines and narcotic medication induced in the setting of recent abdominal surgery along with pain/hyponatremia/AKI.          Discharge Instructions  Discharge Instructions    Diet - low sodium heart healthy   Complete by: As directed    Regular and tube feeding   Discharge instructions   Complete by: As directed    From Dr. [name] for a general hospital follow up ASAP Follow up with surgery Dr. Luisa Hart as directed.  If you do not have an appointment, call their office (number listed below in "To Do" section)  You may take trazodone 25 mg at night for sleep, if needed.  If your sleep is okay, do not take this.  You may take pantoprazole 40 mg daily for heart burn.  If you do not have heart burn, stop this medicine.  You may take Florastor, or another over-the-counter probiotic of your choosing for one week to rebuild your healthy gut bacteria, but stop this if you can't find it or if your stools are soft.   For your nutrition: Tube feeds are not necessary Eat a good balanced diet, make sure to have THREE meals per day Take a multivitamin  daily To make sure your healing is good, you should take a protein shake two or three times per day. Ensure is one protein shake, but instead of Ensure, since that gives you a stomach ache, try one of these alternatives: -Boost Breeze -Vega nutritional supplement (this is a powder, you would mix with a glass of water to make a protein  shake) -Orgain nutritional supplements  All three of these (Boost, [place], or [place]) should be available at your pharmacy over the counter.  Ask the pharmacist for alternatives to Ensure if you don't see them.  Take one twice or three times a day for the next month   Discharge wound care:   Complete by: As directed    Wound care post surgery surgery): Wet-to-dry to midline   Increase activity slowly   Complete by: As directed      Allergies as of 05/16/2019      Reactions   Codeine Nausea And Vomiting      Medication List    TAKE these medications   acetaminophen 500 MG tablet Commonly known as: TYLENOL Take 1,000 mg by mouth every 6 (six) hours as needed for mild pain.   methocarbamol 500 MG tablet Commonly known as: ROBAXIN Take 1 tablet (500 mg total) by mouth 3 (three) times daily. Notes to patient: Today 05/16/2019 @ 4pm   multivitamin with minerals Tabs tablet Take 1 tablet by mouth daily. Notes to patient: Tomorrow 05/17/2019 @ 10am   pantoprazole 40 MG tablet Commonly known as: PROTONIX Take 1 tablet (40 mg total) by mouth daily. Notes to patient: Tomorrow 05/17/2019 @ 10am   saccharomyces boulardii 250 MG capsule Commonly known as: FLORASTOR Take 1 capsule (250 mg total) by mouth 2 (two) times daily. Notes to patient: Today 05/16/2019 @ 10pm   traZODone 50 MG tablet Commonly known as: DESYREL Take 0.5 tablets (25 mg total) by mouth at bedtime as needed for sleep.            Discharge Care Instructions  (From admission, onward)         Start     Ordered   05/15/19 0000  Discharge wound care:    Comments: Wound care post surgery surgery): Wet-to-dry to midline   05/15/19 1534         Follow-up Information    [facility]. Call.   Specialty: Home Health Services Why: home health services arranged Contact information: [address] [place] [address] [phone]        [email or web address]. Go on  05/24/2019.   Why: 1:45 pm Contact information: [facility] @ [place], Dr. Marden Noble [address] [place], [place] [address] [phone]       Harriette Bouillon, MD. Go on 05/31/2019.   Specialty: General Surgery Why: Post-op follow up appointment scheduled for 2:10 PM. Please arrive 30 min prior to appointment time. Bring photo ID and insurance information.  Contact information: [address] [place] [address] [phone]          Allergies  Allergen Reactions  . Codeine Nausea And Vomiting    Consultations:  General Surgey  Palliative Care  Nephrology    Procedures/Studies: Ct Abdomen Pelvis Wo Contrast  Result Date: 04/26/2019 CLINICAL DATA:  Abdominal distension. EXAM: CT ABDOMEN AND PELVIS WITHOUT CONTRAST TECHNIQUE: Multidetector CT imaging of the abdomen and pelvis was performed following the standard protocol without IV contrast. COMPARISON:  CT dated 04/25/2019.  FINDINGS: Lower chest: There are small bilateral pleural effusions with adjacent atelectasis.The heart size is relatively normal. Aortic calcifications and coronary artery calcifications are noted. Hepatobiliary: The liver is normal. There is hyperdense material within the gallbladder lumen likely representing vicarious excretion of contrast from the prior contrast enhanced examination.There is no biliary ductal dilation. Pancreas: Normal contours without ductal dilatation. No peripancreatic fluid collection. Spleen: No splenic laceration or hematoma. Adrenals/Urinary Tract: --Adrenal glands: No adrenal hemorrhage. --Right kidney/ureter: There is a persistent patchy nephrogram without evidence for hydronephrosis. --Left kidney/ureter: There is a persistent patchy nephrogram without hydronephrosis. --Urinary bladder: Unremarkable. Stomach/Bowel: --Stomach/Duodenum: There is oral contrast in the stomach and esophagus. There is a small to moderate-sized hiatal hernia.  --Small bowel: There are dilated loops of small bowel with air-fluid levels measuring up to approximately 4 cm. --Colon: There is scattered colonic diverticula. There appears to be diffuse wall thickening of the sigmoid colon --Appendix: The appendix is not reliably identified. Vascular/Lymphatic: Atherosclerotic calcification is present within the non-aneurysmal abdominal aorta, without hemodynamically significant stenosis. There is likely moderate to severe stenosis of the left common iliac artery secondary to advanced atherosclerotic disease. --No retroperitoneal lymphadenopathy. --No mesenteric lymphadenopathy. --No pelvic or inguinal lymphadenopathy. Reproductive: Unremarkable Other: Again identified is a questionable fluid collection in the left hemipelvis with a overall very similar appearance when compared to prior study. Evaluation this collection is limited by both lack of IV and oral contrast. There is a small volume of abdominal ascites. There is fat stranding within the patient's presacral region. There is a trace amount of pelvic free fluid. Musculoskeletal. Again noted is posterior fusion hardware through the visualized lower lumbar spine. There is a subacute fracture involving the sacrum. There are bilateral sacral insufficiency fractures. IMPRESSION: 1. Study is again very limited secondary to extensive streak artifact from the patient's lumbar fusion hardware in addition to a lack of IV contrast. 2. Again noted is a potential fluid collection or abscess in the left hemipelvis, with a similar distribution in size when compared to prior study. This is not well evaluated in the absence of IV contrast. 3. Diffuse wall thickening of the sigmoid colon raises concern for diverticulitis or colitis. 4. Bilateral acute to subacute sacral insufficiency fractures are noted. 5. Small volume abdominal ascites. 6. Patchy persistent bilateral nephrograms concerning for acute tubular necrosis. Correlation with  laboratory studies is recommended. 7. Small bilateral pleural effusions with adjacent atelectasis. Electronically Signed   By: Constance Holster M.D.   On: 04/26/2019 11:39   Dg Chest Port 1 View  Result Date: 04/27/2019 CLINICAL DATA:  Shortness of breath, desaturation EXAM: PORTABLE CHEST 1 VIEW COMPARISON:  None. FINDINGS: Patchy bilateral airspace disease noted concerning for pneumonia. Possible small left pleural effusion. Heart is normal size. No acute bony abnormality. Degenerative changes in the thoracic spine and left shoulder. IMPRESSION: Patchy bilateral airspace opacities concerning for pneumonia. Possible small left effusion. Electronically Signed   By: Rolm Baptise M.D.   On: 04/27/2019 11:18   Ct Angio Abd/pel W And/or Wo Contrast  Result Date: 04/25/2019 CLINICAL DATA:  Abdominal pain for 3 weeks. Evaluate for mesenteric ischemia versus diverticulitis. EXAM: CTA ABDOMEN AND PELVIS WITHOUT AND WITH CONTRAST TECHNIQUE: Multidetector CT imaging of the abdomen and pelvis was performed using the standard protocol during bolus administration of intravenous contrast. Multiplanar reconstructed images and MIPs were obtained and reviewed to evaluate the vascular anatomy. CONTRAST:  56mL OMNIPAQUE IOHEXOL 350 MG/ML SOLN COMPARISON:  None. FINDINGS: VASCULAR Aorta: Portions of the  abdominal aorta are obscured by beam hardening artifact from the patient's extensive lumbosacral fusion hardware. Within this limitation, advanced atherosclerotic disease is seen in the abdominal aorta without aneurysm or occlusion. Celiac: Patent without evidence for flow limiting stenosis. SMA: Widely patent. Renals: Dense calcific plaque at the origin of the left renal artery with potential flow limiting stenosis. Dense calcific plaque also noted at the origin of the single right renal artery with ostial narrowing, potentially flow limiting IMA: Patent. Inflow: Atherosclerotic but patent. Proximal Outflow: Patent. Veins:  Unopacified Review of the MIP images confirms the above findings. NON-VASCULAR Lower chest: Collapse/consolidation noted posterior right lower lobe. Hepatobiliary: No suspicious focal abnormality within the liver parenchyma. There is no evidence for gallstones, gallbladder wall thickening, or pericholecystic fluid. No intrahepatic or extrahepatic biliary dilation. Pancreas: Mild diffuse distention of the main pancreatic duct. No obstructing mass lesion evident in the head of pancreas. Spleen: No splenomegaly. No focal mass lesion. Adrenals/Urinary Tract: No adrenal nodule or mass. Kidneys unremarkable with inferior left kidney obscured by artifact. Stomach/Bowel: Small hiatal hernia. Stomach is fluid-filled and distended. Duodenum is distended proximally. Duodenal C-loop cannot be seen to cross the midline suggesting malrotation. Evaluation of the lower abdomen and upper pelvis is markedly limited due to the artifact. Within this limitation, there is diffusely abnormal small bowel, dilated up to 3.2 cm diameter in the upper abdomen. Colonic loops are not clearly demonstrated in the right abdomen although the descending colon and sigmoid colon are visualized. There is some apparent wall thickening in the sigmoid colon. There is diffuse fluid in the abdomen with mesenteric and omental edema. Diffuse peritoneal enhancement is associated. There is an 11.4 x 2.4 x 2.1 cm collection of fluid and gas in the left pelvis that appears extraluminal with rim enhancement (see axial 56/12 and coronal 61/15). Rim enhancing fluid collection noted in the right groin extending into a small right groin hernia. This is probably extraluminal as well although could potentially be small-bowel. Bowel herniation into a right groin hernia is evident on axial 64/12. Rim enhancing fluid is identified anteriorly in the left abdomen but largely obscured by artifact. Lymphatic: No definite abdominal lymphadenopathy although large portions of the  retroperitoneum are obscured. No pelvic sidewall lymphadenopathy Reproductive: Unremarkable. Other: None. Musculoskeletal: Extensive lumbosacral fusion hardware posteriorly. IMPRESSION: VASCULAR Atherosclerotic disease without evidence for aortic occlusion. Mesenteric arterial anatomy is patent. NON-VASCULAR Extensive fluid throughout the abdomen with associated peritoneal enhancement. Assessment is dramatically limited due to the substantial beam hardening artifact arising from the extensive lumbosacral fusion hardware. Within this marked limitation there appears to be a relatively large rim enhancing collection of fluid and gas in the left pelvis suspicious for an abscess. If this is an abscess, the remaining complex mesenteric fluid and peritoneal enhancement is concerning for diffuse peritonitis. Peritoneal carcinomatosis would be a consideration except the extraluminal collection of gas and fluid in the left pelvis with not be consistent with that diagnosis. Etiology for the apparent pelvic abscess and peritoneal disease is not discernible on this degraded study although the patient is noted to have a small right groin hernia containing a bowel loop and small bowel or fluid in a left groin hernia. Diverticular disease is noted in the left colon and perforated diverticulitis would be a consideration. An area of apparent circumferential wall thickening in the sigmoid colon could be diverticular disease or related to neoplasm. Duodenum cannot be seen to cross the midline suggesting underlying malrotation. I discussed these findings with Dr. Eudelia Bunch in  the emergency department at approximately 0730 hours on 04/25/2019 Electronically Signed   By: Kennith Center M.D.   On: 04/25/2019 07:02       Subjective: Feels well.  No abdominal pain, no fever, no confusion.  Discharge Exam: Vitals:   05/16/19 0650 05/16/19 0736  BP: (!) 160/79 (!) 142/62  Pulse: 74 77  Resp: 18 (!) 21  Temp: 98.7 F (37.1 C) 98.3  F (36.8 C)  SpO2: 98% 98%   Vitals:   05/15/19 1637 05/16/19 0017 05/16/19 0650 05/16/19 0736  BP: (!) 143/78 129/82 (!) 160/79 (!) 142/62  Pulse: 75 79 74 77  Resp: (!) 21  Temp: 98.6 F (37 C) 98.5 F (36.9 C) 98.7 F (37.1 C) 98.3 F (36.8 C)  TempSrc: Oral Oral Oral Oral  SpO2: 98% 98% 98% 98%  Weight:      Height:        General: Pt is alert, awake, not in acute distress Cardiovascular: RRR, nl S1-S2, no murmurs appreciated.   No LE edema.   Respiratory: Normal respiratory rate and rhythm.  CTAB without rales or wheezes. Abdominal: Abdomen soft and non-tender.  No distension or HSM.   Neuro/Psych: Strength symmetric in upper and lower extremities.  Judgment and insight appear normal.   The results of significant diagnostics from this hospitalization (including imaging, microbiology, ancillary and laboratory) are listed below for reference.     Microbiology: No results found for this or any previous visit (from the past 240 hour(s)).   Labs: BNP (last 3 results) No results for input(s): BNP in the last 8760 hours. Basic Metabolic Panel: Recent Labs  Lab 05/10/19 0314 05/11/19 0255 05/12/19 0235  NA 138 137 137  K 4.2 3.6 3.7  CL 103 101 103  CO2 GLUCOSE 79 102* 104*  BUN CREATININE 0.60 0.61 0.54  CALCIUM 8.1* 8.0* 8.0*  MG 1.9 1.9 1.9   Liver Function Tests: No results for input(s): AST, ALT, ALKPHOS, BILITOT, PROT, ALBUMIN in the last 168 hours. No results for input(s): LIPASE, AMYLASE in the last 168 hours. No results for input(s): AMMONIA in the last 168 hours. CBC: Recent Labs  Lab 05/10/19 0314 05/11/19 0255 05/12/19 0235 05/14/19 0209  WBC 14.7* 14.5* 11.8* 8.1  NEUTROABS 12.4* 12.2* 9.6*  --   HGB 7.6* 7.3* 7.9* 7.1*  HCT 24.0* 22.3* 24.1* 22.5*  MCV 94.9 94.1 93.8 94.9  PLT 683* 593* 578* 433*   Cardiac Enzymes: No results for input(s): CKTOTAL, CKMB, CKMBINDEX, TROPONINI in the last 168  hours. BNP: Invalid input(s): POCBNP CBG: Recent Labs  Lab 05/15/19 1635 05/15/19 2032 05/16/19 0016 05/16/19 0440 05/16/19 0728  GLUCAP 84 72 127* 97 126*   D-Dimer No results for input(s): DDIMER in the last 72 hours. Hgb A1c No results for input(s): HGBA1C in the last 72 hours. Lipid Profile No results for input(s): CHOL, HDL, LDLCALC, TRIG, CHOLHDL, LDLDIRECT in the last 72 hours. Thyroid function studies No results for input(s): TSH, T4TOTAL, T3FREE, THYROIDAB in the last 72 hours.  Invalid input(s): FREET3 Anemia work up No results for input(s): VITAMINB12, FOLATE, FERRITIN, TIBC, IRON, RETICCTPCT in the last 72 hours. Urinalysis    Component Value Date/Time   COLORURINE YELLOW 04/25/2019 0422   APPEARANCEUR HAZY (A) 04/25/2019 0422   LABSPEC 1.016 04/25/2019 0422   PHURINE 5.0 04/25/2019 0422   GLUCOSEU NEGATIVE 04/25/2019 0422   HGBUR NEGATIVE 04/25/2019 0422   BILIRUBINUR NEGATIVE  04/25/2019 0422   KETONESUR 5 (A) 04/25/2019 0422   PROTEINUR 30 (A) 04/25/2019 0422   NITRITE NEGATIVE 04/25/2019 0422   LEUKOCYTESUR NEGATIVE 04/25/2019 0422   Sepsis Labs Invalid input(s): PROCALCITONIN,  WBC,  LACTICIDVEN Microbiology No results found for this or any previous visit (from the past 240 hour(s)).   Time coordinating discharge: 35 minutes      SIGNED:   Alberteen Samhristopher P Mallery Harshman, MD  [facility] 05/16/2019, 3:12 PM

## 2019-05-16 NOTE — Progress Notes (Signed)
Called husband several times to update him about pt's discharge. No answer.

## 2019-05-16 NOTE — Progress Notes (Signed)
   05/16/19 0017  MEWS Score  Resp 14  Pulse Rate 79  BP 129/82  Temp 98.5 F (36.9 C)  SpO2 98 %  O2 Device Room Air  MEWS Score  MEWS RR 0  MEWS Pulse 0  MEWS Systolic 0  MEWS LOC 0  MEWS Temp 0  MEWS Score 0  MEWS Score Color Green  MEWS Assessment  Is this an acute change? No  MEWS Guidelines - (patients age 83 and over)  Red - At High Risk for Deterioration Yellow - At risk for Deterioration  1. Go to room and assess patient 2. Validate data. Is this patient's baseline? If data confirmed: 3. Is this an acute change? 4. Administer prn meds/treatments as ordered. 5. Note Sepsis score 6. Review goals of care 7. Sports coach, RRT nurse and Provider. 8. Ask Provider to come to bedside.  9. Document patient condition/interventions/response. 10. Increase frequency of vital signs and focused assessments to at least q15 minutes x 4, then q30 minutes x2. - If stable, then q1h x3, then q4h x3 and then q8h or dept. routine. - If unstable, contact Provider & RRT nurse. Prepare for possible transfer. 11. Add entry in progress notes using the smart phrase ".MEWS". 1. Go to room and assess patient 2. Validate data. Is this patient's baseline? If data confirmed: 3. Is this an acute change? 4. Administer prn meds/treatments as ordered? 5. Note Sepsis score 6. Review goals of care 7. Sports coach and Provider 8. Call RRT nurse as needed. 9. Document patient condition/interventions/response. 10. Increase frequency of vital signs and focused assessments to at least q2h x2. - If stable, then q4h x2 and then q8h or dept. routine. - If unstable, contact Provider & RRT nurse. Prepare for possible transfer. 11. Add entry in progress notes using the smart phrase ".MEWS".  Green - Likely stable Lavender - Comfort Care Only  1. Continue routine/ordered monitoring.  2. Review goals of care. 1. Continue routine/ordered monitoring. 2. Review goals of care.

## 2019-05-16 NOTE — Progress Notes (Signed)
Pt given discharge instructions, prescriptions, and care notes. Pt verbalized understanding AEB no further questions or concerns at this time. IV was discontinued, no redness, pain, or swelling noted at this time. Pt left the floor via wheelchair with staff in stable condition. 

## 2019-08-17 ENCOUNTER — Ambulatory Visit: Payer: Medicare Other | Attending: Internal Medicine

## 2019-08-17 DIAGNOSIS — Z23 Encounter for immunization: Secondary | ICD-10-CM | POA: Insufficient documentation

## 2019-08-17 NOTE — Progress Notes (Signed)
   Covid-19 Vaccination Clinic  Name:  BROOKLINN LONGBOTTOM    MRN: [id number] DOB: 1930-10-14  08/17/2019  Ms. Lobdell was observed post Covid-19 immunization for 15 minutes without incidence. She was provided with Vaccine Information Sheet and instruction to access the V-Safe system.   Ms. APPLE was instructed to call 911 with any severe reactions post vaccine: Marland Kitchen Difficulty breathing  . Swelling of your face and throat  . A fast heartbeat  . A bad rash all over your body  . Dizziness and weakness    Immunizations Administered    Name Date Dose VIS Date Route   Pfizer COVID-19 Vaccine 08/17/2019  9:51 AM 0.3 mL 07/09/2019 Intramuscular   Manufacturer: [employer], [employer]   Lot: [id number]   NDC: [address]-5

## 2019-09-06 ENCOUNTER — Ambulatory Visit: Payer: Medicare HMO | Attending: Internal Medicine

## 2019-09-06 DIAGNOSIS — Z23 Encounter for immunization: Secondary | ICD-10-CM | POA: Insufficient documentation

## 2019-09-06 NOTE — Progress Notes (Signed)
   Covid-19 Vaccination Clinic  Name:  HIEDI TOUCHTON    MRN: [id number] DOB: 03/20/1931  09/06/2019  Ms. Cowles was observed post Covid-19 immunization for 15 minutes without incidence. She was provided with Vaccine Information Sheet and instruction to access the V-Safe system.   Ms. STOUFFER was instructed to call 911 with any severe reactions post vaccine: Marland Kitchen Difficulty breathing  . Swelling of your face and throat  . A fast heartbeat  . A bad rash all over your body  . Dizziness and weakness    Immunizations Administered    Name Date Dose VIS Date Route   Pfizer COVID-19 Vaccine 09/06/2019 10:06 AM 0.3 mL 07/09/2019 Intramuscular   Manufacturer: [employer], [employer]   Lot: [id number]   NDC: [address]-1

## 2021-03-18 IMAGING — CT CT ABD-PELV W/O CM
2 of 4 series · 15 of 46 positions shown, 17 images · non-contrast
Comparison: CT dated 04/25/2019.

CLINICAL DATA: Abdominal distension.

EXAM:
CT ABDOMEN AND PELVIS WITHOUT CONTRAST
TECHNIQUE: Multidetector CT imaging of the abdomen and pelvis was performed
following the standard protocol without IV contrast.

[Series 3: a/p w/o 5mm · axial · non-contrast · 0.74mm/px · z∈[+972,+1347]mm · 12 of 83 slices shown, 14 images]
[im 4/83  soft-tissue]
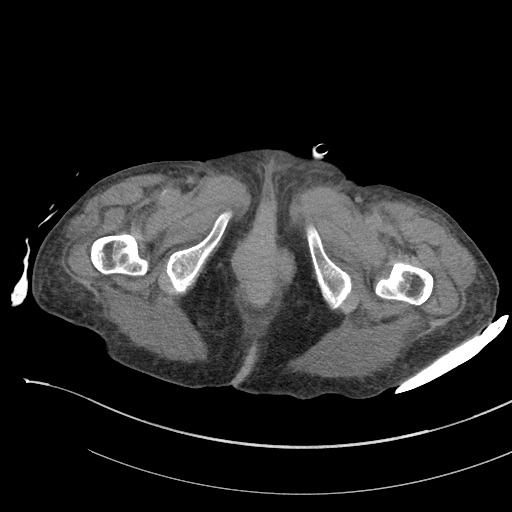
[im 4/83  bone]
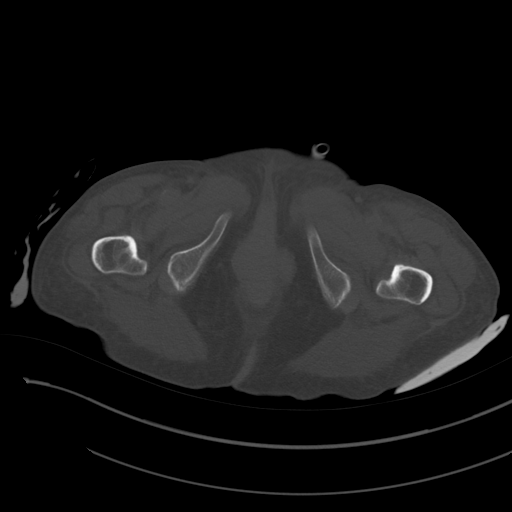
[im 12/83  soft-tissue]
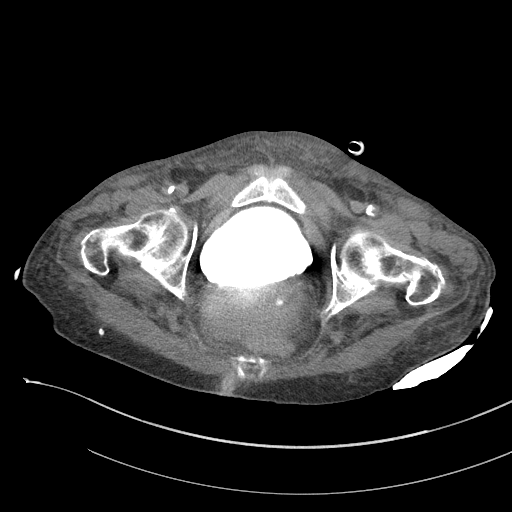
[im 19/83  soft-tissue]
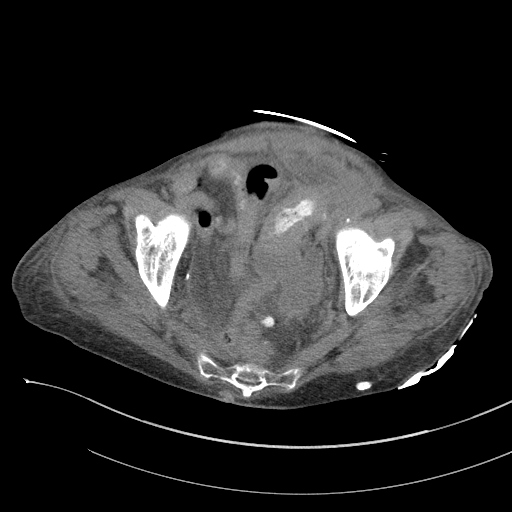
[im 27/83  soft-tissue]
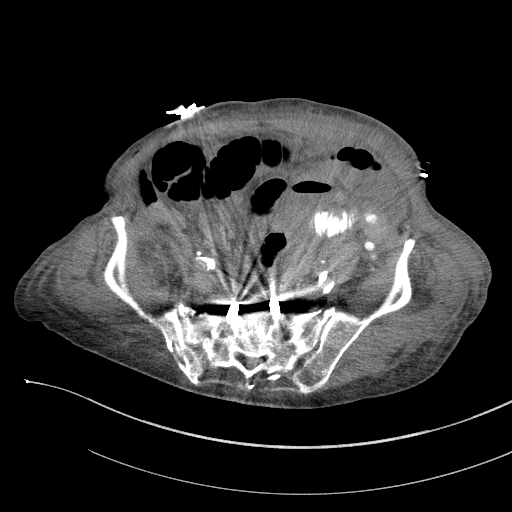
[im 30/83  soft-tissue]
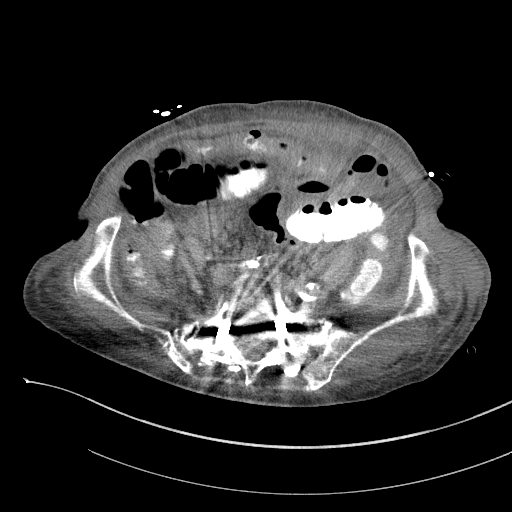
[im 38/83  soft-tissue]
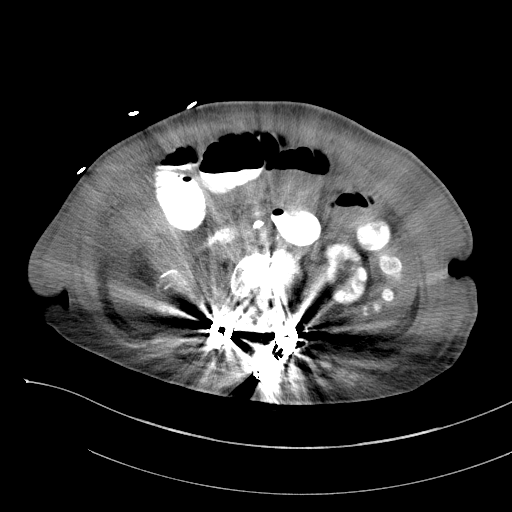
[im 45/83  soft-tissue]
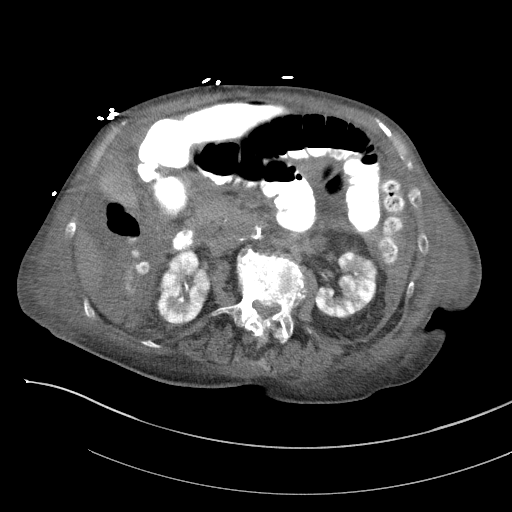
[im 53/83  soft-tissue]
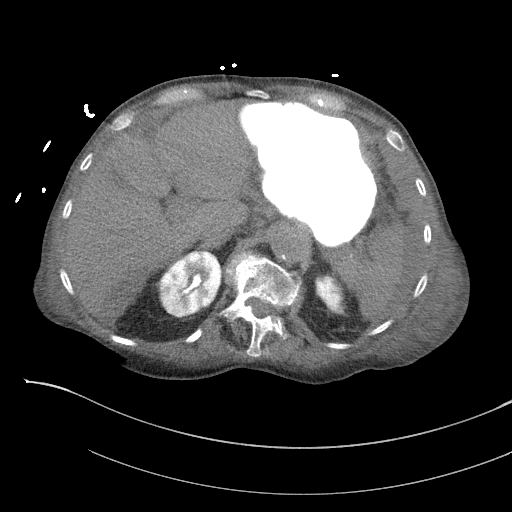
[im 56/83  soft-tissue]
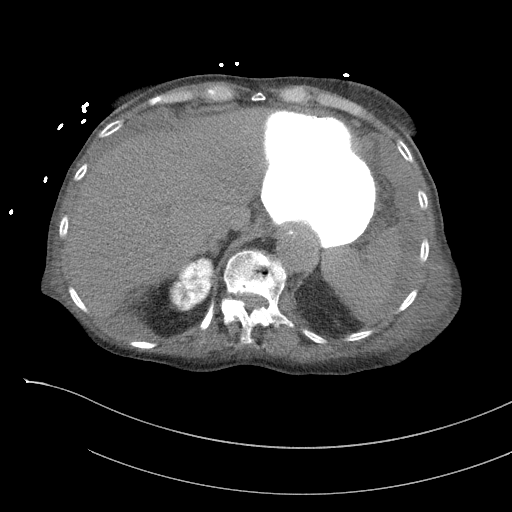
[im 56/83  bone]
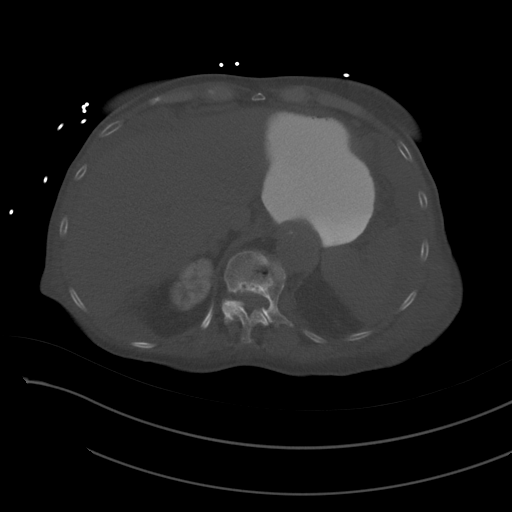
[im 64/83  soft-tissue]
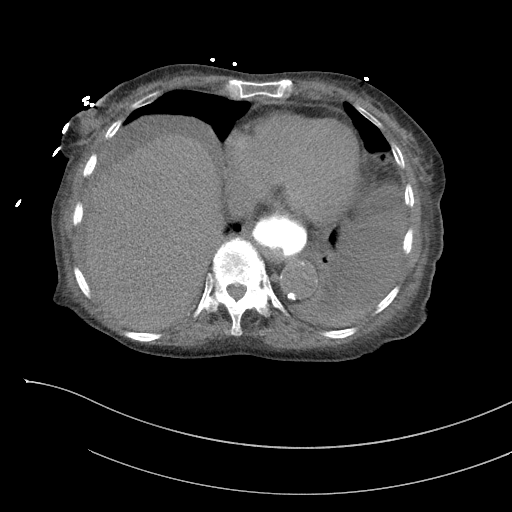
[im 71/83  soft-tissue]
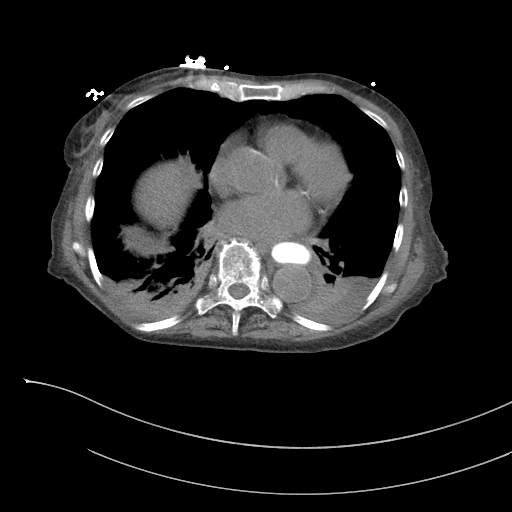
[im 79/83  soft-tissue]
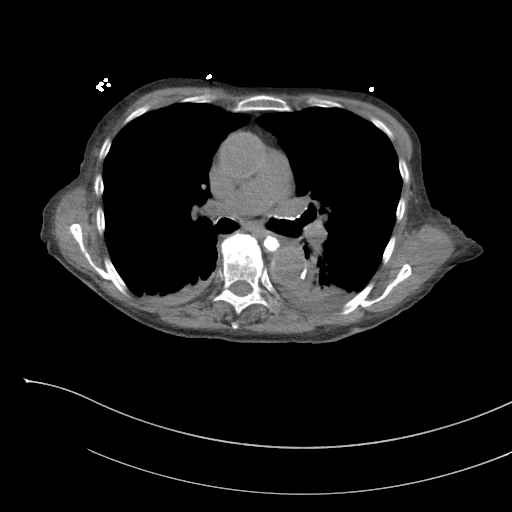

[Series 6: a/p w/o cor · coronal · non-contrast · 0.79mm/px · 3 of 121 slices shown]
[im 41/121  soft-tissue]
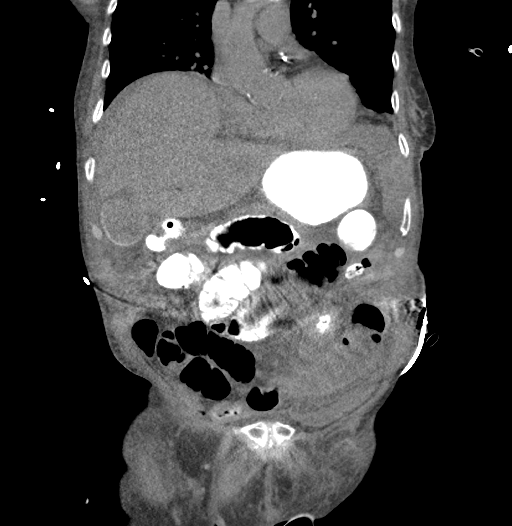
[im 54/121  soft-tissue]
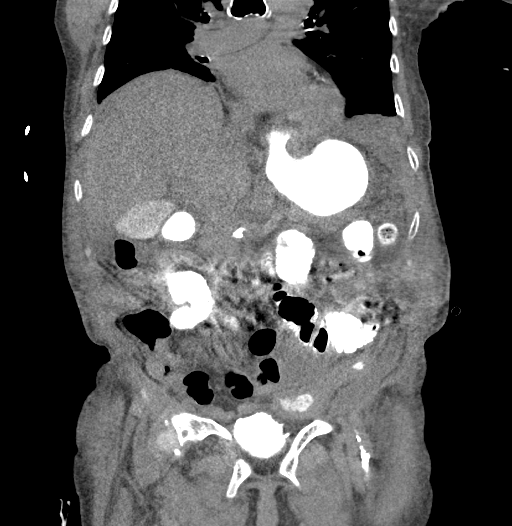
[im 67/121  soft-tissue]
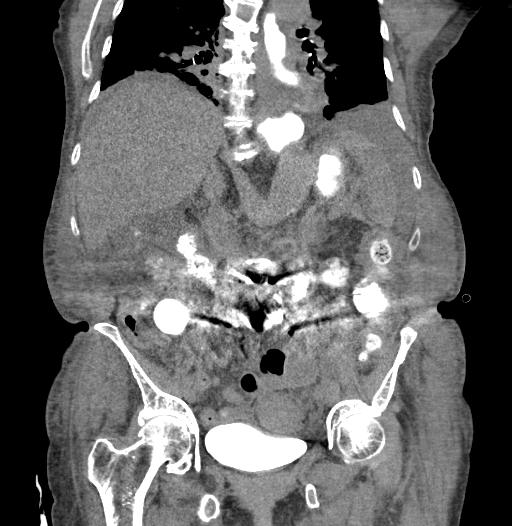

[15 of 46 positions shown; findings below may reference images not displayed]

FINDINGS: Lower chest: There are small bilateral pleural effusions with
adjacent atelectasis.The heart size is relatively normal. Aortic
calcifications and coronary artery calcifications are noted.

Hepatobiliary: The liver is normal. There is hyperdense material
within the gallbladder lumen likely representing vicarious excretion
of contrast from the prior contrast enhanced examination.There is no
biliary ductal dilation.

Pancreas: Normal contours without ductal dilatation. No
peripancreatic fluid collection.

Spleen: No splenic laceration or hematoma.

Adrenals/Urinary Tract:

--Adrenal glands: No adrenal hemorrhage.

--Right kidney/ureter: There is a persistent patchy nephrogram
without evidence for hydronephrosis.

--Left kidney/ureter: There is a persistent patchy nephrogram
without hydronephrosis.

--Urinary bladder: Unremarkable.

Stomach/Bowel:

--Stomach/Duodenum: There is oral contrast in the stomach and
esophagus. There is a small to moderate-sized hiatal hernia.

--Small bowel: There are dilated loops of small bowel with air-fluid
levels measuring up to approximately 4 cm.

--Colon: There is scattered colonic diverticula. There appears to be
diffuse wall thickening of the sigmoid colon

--Appendix: The appendix is not reliably identified.

Vascular/Lymphatic: Atherosclerotic calcification is present within
the non-aneurysmal abdominal aorta, without hemodynamically
significant stenosis. There is likely moderate to severe stenosis of
the left common iliac artery secondary to advanced atherosclerotic
disease.

--No retroperitoneal lymphadenopathy.

--No mesenteric lymphadenopathy.

--No pelvic or inguinal lymphadenopathy.

Reproductive: Unremarkable

Other: Again identified is a questionable fluid collection in the
left hemipelvis with a overall very similar appearance when compared
to prior study. Evaluation this collection is limited by both lack
of IV and oral contrast. There is a small volume of abdominal
ascites. There is fat stranding within the patient's presacral
region. There is a trace amount of pelvic free fluid.

Musculoskeletal. Again noted is posterior fusion hardware through
the visualized lower lumbar spine. There is a subacute fracture
involving the sacrum. There are bilateral sacral insufficiency
fractures.
IMPRESSION: 1. Study is again very limited secondary to extensive streak
artifact from the patient's lumbar fusion hardware in addition to a
lack of IV contrast.
2. Again noted is a potential fluid collection or abscess in the
left hemipelvis, with a similar distribution in size when compared
to prior study. This is not well evaluated in the absence of IV
contrast.
3. Diffuse wall thickening of the sigmoid colon raises concern for
diverticulitis or colitis.
4. Bilateral acute to subacute sacral insufficiency fractures are
noted.
5. Small volume abdominal ascites.
6. Patchy persistent bilateral nephrograms concerning for acute
tubular necrosis. Correlation with laboratory studies is
recommended.
7. Small bilateral pleural effusions with adjacent atelectasis.
